# Patient Record
Sex: Male | Born: 1945 | Race: White | Hispanic: No | Smoking: Former smoker
Health system: Southern US, Community
[De-identification: ages and names within clinical notes are randomized; demographics above are authoritative.]

## PROBLEM LIST (undated history)

## (undated) DIAGNOSIS — I219 Acute myocardial infarction, unspecified: Secondary | ICD-10-CM

## (undated) DIAGNOSIS — I6529 Occlusion and stenosis of unspecified carotid artery: Secondary | ICD-10-CM

## (undated) DIAGNOSIS — R011 Cardiac murmur, unspecified: Secondary | ICD-10-CM

## (undated) DIAGNOSIS — I639 Cerebral infarction, unspecified: Secondary | ICD-10-CM

## (undated) DIAGNOSIS — K219 Gastro-esophageal reflux disease without esophagitis: Secondary | ICD-10-CM

## (undated) DIAGNOSIS — I2510 Atherosclerotic heart disease of native coronary artery without angina pectoris: Secondary | ICD-10-CM

## (undated) DIAGNOSIS — J449 Chronic obstructive pulmonary disease, unspecified: Secondary | ICD-10-CM

## (undated) DIAGNOSIS — I10 Essential (primary) hypertension: Secondary | ICD-10-CM

## (undated) HISTORY — DX: Cerebral infarction, unspecified: I63.9

## (undated) HISTORY — PX: EYE SURGERY: SHX253

## (undated) HISTORY — PX: VASECTOMY: SHX75

## (undated) HISTORY — DX: Atherosclerotic heart disease of native coronary artery without angina pectoris: I25.10

---

## 2005-02-19 ENCOUNTER — Ambulatory Visit: Payer: Self-pay | Admitting: Cardiology

## 2005-03-27 ENCOUNTER — Ambulatory Visit: Payer: Self-pay | Admitting: Cardiology

## 2011-08-23 DIAGNOSIS — I219 Acute myocardial infarction, unspecified: Secondary | ICD-10-CM

## 2011-08-23 HISTORY — DX: Acute myocardial infarction, unspecified: I21.9

## 2011-09-10 ENCOUNTER — Other Ambulatory Visit: Payer: Self-pay

## 2011-09-10 ENCOUNTER — Inpatient Hospital Stay (HOSPITAL_COMMUNITY): Payer: BC Managed Care – PPO

## 2011-09-10 ENCOUNTER — Emergency Department (HOSPITAL_COMMUNITY): Payer: BC Managed Care – PPO

## 2011-09-10 ENCOUNTER — Inpatient Hospital Stay (HOSPITAL_COMMUNITY)
Admission: EM | Admit: 2011-09-10 | Discharge: 2011-09-16 | DRG: 550 | Disposition: A | Discharge status: Home / Self Care | Payer: BC Managed Care – PPO | Attending: Neurology | Admitting: Neurology

## 2011-09-10 ENCOUNTER — Encounter: Payer: Self-pay | Admitting: Emergency Medicine

## 2011-09-10 DIAGNOSIS — I6529 Occlusion and stenosis of unspecified carotid artery: Secondary | ICD-10-CM | POA: Diagnosis present

## 2011-09-10 DIAGNOSIS — K219 Gastro-esophageal reflux disease without esophagitis: Secondary | ICD-10-CM | POA: Diagnosis present

## 2011-09-10 DIAGNOSIS — I10 Essential (primary) hypertension: Secondary | ICD-10-CM | POA: Diagnosis present

## 2011-09-10 DIAGNOSIS — R29810 Facial weakness: Secondary | ICD-10-CM | POA: Diagnosis present

## 2011-09-10 DIAGNOSIS — I658 Occlusion and stenosis of other precerebral arteries: Secondary | ICD-10-CM | POA: Diagnosis present

## 2011-09-10 DIAGNOSIS — I6340 Cerebral infarction due to embolism of unspecified cerebral artery: Secondary | ICD-10-CM | POA: Diagnosis present

## 2011-09-10 DIAGNOSIS — I213 ST elevation (STEMI) myocardial infarction of unspecified site: Secondary | ICD-10-CM

## 2011-09-10 DIAGNOSIS — F17200 Nicotine dependence, unspecified, uncomplicated: Secondary | ICD-10-CM | POA: Diagnosis present

## 2011-09-10 DIAGNOSIS — I639 Cerebral infarction, unspecified: Secondary | ICD-10-CM

## 2011-09-10 DIAGNOSIS — R4701 Aphasia: Secondary | ICD-10-CM | POA: Diagnosis present

## 2011-09-10 DIAGNOSIS — G8100 Flaccid hemiplegia affecting unspecified side: Secondary | ICD-10-CM | POA: Diagnosis present

## 2011-09-10 DIAGNOSIS — F1010 Alcohol abuse, uncomplicated: Secondary | ICD-10-CM | POA: Diagnosis present

## 2011-09-10 DIAGNOSIS — Z720 Tobacco use: Secondary | ICD-10-CM | POA: Diagnosis present

## 2011-09-10 DIAGNOSIS — I2119 ST elevation (STEMI) myocardial infarction involving other coronary artery of inferior wall: Principal | ICD-10-CM

## 2011-09-10 HISTORY — DX: Cerebral infarction, unspecified: I63.9

## 2011-09-10 HISTORY — DX: Essential (primary) hypertension: I10

## 2011-09-10 HISTORY — DX: Gastro-esophageal reflux disease without esophagitis: K21.9

## 2011-09-10 LAB — COMPREHENSIVE METABOLIC PANEL WITH GFR
ALT: 16 U/L (ref 0–53)
AST: 43 U/L — ABNORMAL HIGH (ref 0–37)
Albumin: 3.6 g/dL (ref 3.5–5.2)
Alkaline Phosphatase: 69 U/L (ref 39–117)
BUN: 9 mg/dL (ref 6–23)
CO2: 24 meq/L (ref 19–32)
Calcium: 9 mg/dL (ref 8.4–10.5)
Chloride: 93 meq/L — ABNORMAL LOW (ref 96–112)
Creatinine, Ser: 0.94 mg/dL (ref 0.50–1.35)
GFR calc Af Amer: >90 mL/min
GFR calc non Af Amer: 87 mL/min — ABNORMAL LOW
Glucose, Bld: 124 mg/dL — ABNORMAL HIGH (ref 70–99)
Potassium: 4.1 meq/L (ref 3.5–5.1)
Sodium: 129 meq/L — ABNORMAL LOW (ref 135–145)
Total Bilirubin: 1.1 mg/dL (ref 0.3–1.2)
Total Protein: 7.1 g/dL (ref 6.0–8.3)

## 2011-09-10 LAB — GLUCOSE, CAPILLARY: Glucose-Capillary: 119 mg/dL — ABNORMAL HIGH (ref 70–99)

## 2011-09-10 LAB — DIFFERENTIAL
Basophils Absolute: 0 10*3/uL (ref 0.0–0.1)
Basophils Relative: 0 % (ref 0–1)
Eosinophils Absolute: 0 10*3/uL (ref 0.0–0.7)
Eosinophils Relative: 0 % (ref 0–5)
Monocytes Absolute: 1.5 10*3/uL — ABNORMAL HIGH (ref 0.1–1.0)
Monocytes Relative: 13 % — ABNORMAL HIGH (ref 3–12)
Neutro Abs: 7.8 10*3/uL — ABNORMAL HIGH (ref 1.7–7.7)

## 2011-09-10 LAB — CK TOTAL AND CKMB (NOT AT ARMC)
CK, MB: 10.5 ng/mL (ref 0.3–4.0)
Relative Index: 6.6 — ABNORMAL HIGH (ref 0.0–2.5)
Total CK: 159 U/L (ref 7–232)

## 2011-09-10 LAB — CBC
HCT: 42.8 % (ref 39.0–52.0)
Hemoglobin: 15.7 g/dL (ref 13.0–17.0)
MCH: 35.4 pg — ABNORMAL HIGH (ref 26.0–34.0)
MCHC: 36.7 g/dL — ABNORMAL HIGH (ref 30.0–36.0)
MCV: 96.6 fL (ref 78.0–100.0)
RDW: 12.3 % (ref 11.5–15.5)

## 2011-09-10 LAB — POCT I-STAT, CHEM 8
Creatinine, Ser: 0.9 mg/dL (ref 0.50–1.35)
Glucose, Bld: 126 mg/dL — ABNORMAL HIGH (ref 70–99)
HCT: 49 % (ref 39.0–52.0)
Hemoglobin: 16.7 g/dL (ref 13.0–17.0)
Potassium: 4.2 mEq/L (ref 3.5–5.1)
Sodium: 131 mEq/L — ABNORMAL LOW (ref 135–145)
TCO2: 24 mmol/L (ref 0–100)

## 2011-09-10 LAB — APTT: aPTT: 26 seconds (ref 24–37)

## 2011-09-10 LAB — TROPONIN I: Troponin I: 1.38 ng/mL

## 2011-09-10 MED ORDER — SODIUM CHLORIDE 0.9 % IV SOLN
INTRAVENOUS | Status: DC
  Administered 2011-09-11: 08:00:00 via INTRAVENOUS

## 2011-09-10 MED ORDER — LABETALOL HCL 5 MG/ML IV SOLN
10.0000 mg | INTRAVENOUS | Status: DC | PRN
  Filled 2011-09-10 (×2): qty 16

## 2011-09-10 MED ORDER — PANTOPRAZOLE SODIUM 40 MG IV SOLR
40.0000 mg | Freq: Every day | INTRAVENOUS | Status: DC
  Administered 2011-09-10: 40 mg via INTRAVENOUS
  Filled 2011-09-10 (×2): qty 40

## 2011-09-10 MED ORDER — SODIUM CHLORIDE 0.9 % IV SOLN
250.0000 mL | Freq: Once | INTRAVENOUS | Status: AC
  Administered 2011-09-10: 250 mL via INTRAVENOUS

## 2011-09-10 MED ORDER — ACETAMINOPHEN 650 MG RE SUPP: 650.0000 mg | RECTAL | Status: DC | PRN

## 2011-09-10 MED ORDER — ONDANSETRON HCL 4 MG/2ML IJ SOLN: 4.0000 mg | Freq: Four times a day (QID) | INTRAMUSCULAR | Status: DC | PRN

## 2011-09-10 MED ORDER — ROSUVASTATIN CALCIUM 40 MG PO TABS
40.0000 mg | ORAL_TABLET | Freq: Every day | ORAL | Status: DC
  Administered 2011-09-10 – 2011-09-15 (×6): 40 mg via ORAL
  Filled 2011-09-10 (×7): qty 1

## 2011-09-10 MED ORDER — ACETAMINOPHEN 325 MG PO TABS
650.0000 mg | ORAL_TABLET | ORAL | Status: DC | PRN
  Administered 2011-09-11 – 2011-09-16 (×7): 650 mg via ORAL
  Filled 2011-09-10 (×7): qty 2

## 2011-09-10 MED ORDER — ALTEPLASE (STROKE) FULL DOSE INFUSION
0.9000 mg/kg | Freq: Once | INTRAVENOUS | Status: AC
  Administered 2011-09-10: 49 mg via INTRAVENOUS
  Filled 2011-09-10: qty 100

## 2011-09-10 MED ORDER — IOHEXOL 300 MG/ML  SOLN
80.0000 mL | Freq: Once | INTRAMUSCULAR | Status: AC | PRN
  Administered 2011-09-10: 80 mL via INTRAVENOUS

## 2011-09-10 MED ORDER — METOPROLOL TARTRATE 50 MG PO TABS
50.0000 mg | ORAL_TABLET | Freq: Two times a day (BID) | ORAL | Status: DC
  Administered 2011-09-10 – 2011-09-14 (×9): 50 mg via ORAL
  Filled 2011-09-10 (×11): qty 1

## 2011-09-10 MED ORDER — SENNOSIDES-DOCUSATE SODIUM 8.6-50 MG PO TABS
1.0000 | ORAL_TABLET | Freq: Every evening | ORAL | Status: DC | PRN
  Filled 2011-09-10: qty 1

## 2011-09-10 NOTE — ED Notes (Signed)
ckmb is 10.5 trop 1.38. Dr. Thad Ranger at bedside with pt and aware of lab values

## 2011-09-10 NOTE — ED Notes (Signed)
Pt returned from CT scan to RM 9 pod A and triage assessment performed

## 2011-09-10 NOTE — ED Notes (Signed)
3108-01 READY

## 2011-09-10 NOTE — ED Notes (Signed)
TPA medication / pump settings checked with Benson Norway, RN and Charlton Amor, RN prior to administration

## 2011-09-10 NOTE — Code Documentation (Addendum)
65yo male presented to the ED at 1600. He was at [facility] waiting on a prescription when he suddenly was unable to talk and got dizzy. Upon further questioning, he admits to some transient visual disturbance this AM which cleared, and some numbness of his L face at 1400. So LSN was established at 1400. EMS activated code stroke at 1541, and stroke team was in ED at 1548. Pt arrived at 1600 and was examined by EDP at that time. He arrived in CT at 1604. NIHSS was 7, with pt very dysarthric, ataxic all 4 extremities and unable to cross midline with R eye. Pt c/o double vision, but visual fields seem to be intact. Dr. Thad Ranger here.

## 2011-09-10 NOTE — ED Notes (Signed)
Pt placed on 2 L of oxygen, pt placed on monitor, placed in gown

## 2011-09-10 NOTE — ED Notes (Signed)
Received report on pt from EMS in CT scan. This is the first time I was able to assess pt

## 2011-09-10 NOTE — ED Notes (Signed)
Patient transported to CT 

## 2011-09-10 NOTE — ED Notes (Signed)
CBG 119 mg/dl by glucometer in ED

## 2011-09-10 NOTE — ED Provider Notes (Signed)
History     CSN: [id number] Arrival date & time: 09/10/2011  4:01 PM   First MD Initiated Contact with Patient 09/10/11 1602    Level V caveat due to urgent need for intervention of patient admission.  Chief Complaint  Patient presents with  . Code Stroke    (Consider location/radiation/quality/duration/timing/severity/associated sxs/prior treatment) The history is provided by the patient, the EMS personnel and a relative.   [facility], when he was counseled over the chair. Last normal 3:00. He a right-sided flaccid paresis at the time of facial droop. He also was unable to speak. He had some confusion 2. He was grabbing at his face. He came in as a code stroke. His initial sugar was okay. He was not hypoxic or severely hypertensive. His report he has a history of hypertension. Patient was taken emergently to CT. Upon arrival to the ED he had had an improvement in his paresis. Speech is improved a little bit. His facial droop is also improved  Past Medical History  Diagnosis Date  . Hypertension   . GERD (gastroesophageal reflux disease)     History reviewed. No pertinent past surgical history.  History reviewed. No pertinent family history.  History  Substance Use Topics  . Smoking status: Current Everyday Smoker -- 1.0 packs/day  . Smokeless tobacco: Not on file  . Alcohol Use: Yes     2-3x/ wk      Review of Systems  Unable to perform ROS: Mental status change    Allergies  Codeine  Home Medications  No current outpatient prescriptions on file.  BP 116/57  Pulse 65  Temp(Src) 97.9 F (36.6 C) (Oral)  Resp 17  Ht 5\' 9"  (1.753 m)  Wt 125 lb 7.1 oz (56.9 kg)  BMI 18.52 kg/m2  SpO2 96%  Physical Exam  Constitutional: He appears well-developed.  HENT:  Head: Normocephalic.  Eyes:       Patient's right eye will not cross the nasal midline. Will look laterally. The left eye appears to have full range of motion.  Neck: Normal range of  motion.  Cardiovascular: Normal rate.   Pulmonary/Chest: Effort normal.       Mildly harsh breath sounds  Abdominal: Soft. There is no tenderness.  Musculoskeletal:       Finger-nose is grossly off on the right side. Still able follow commands on that side. Although initially he would not follow commands.  Neurological: He is alert.       Patient is awake. Decreased verbal. It appears to wax and wane. He sometimes will speech in stuttering phrases. He'll follow some commands. Finger-nose is off on the right. Right side appears somewhat weak compared to left. On his extraocular exam. His right initially would not cross the midline medially at all. It is since improved but will not go away. Complete NIH score done by neurology.  Skin: Skin is warm.    ED Course  Procedures (including critical care time)  Labs Reviewed  CBC - Abnormal; Notable for the following:    WBC 11.4 (*)    MCH 35.4 (*)    MCHC 36.7 (*)    All other components within normal limits  DIFFERENTIAL - Abnormal; Notable for the following:    Neutro Abs 7.8 (*)    Monocytes Relative 13 (*)    Monocytes Absolute 1.5 (*)    All other components within normal limits  COMPREHENSIVE METABOLIC PANEL - Abnormal; Notable for the following:  Sodium 129 (*)    Chloride 93 (*)    Glucose, Bld 124 (*)    AST 43 (*)    GFR calc non Af Amer 87 (*)    All other components within normal limits  CK TOTAL AND CKMB - Abnormal; Notable for the following:    CK, MB 10.5 (*)    Relative Index 6.6 (*)    All other components within normal limits  TROPONIN I - Abnormal; Notable for the following:    Troponin I 1.38 (*)    All other components within normal limits  POCT I-STAT, CHEM 8 - Abnormal; Notable for the following:    Sodium 131 (*)    Glucose, Bld 126 (*)    Calcium, Ion 1.06 (*)    All other components within normal limits  GLUCOSE, CAPILLARY - Abnormal; Notable for the following:    Glucose-Capillary 119 (*)    All  other components within normal limits  GLUCOSE, CAPILLARY - Abnormal; Notable for the following:    Glucose-Capillary 106 (*)    All other components within normal limits  PROTIME-INR  APTT  MRSA PCR SCREENING  I-STAT, CHEM 8  POCT CBG MONITORING  MRSA PCR SCREENING  HEMOGLOBIN A1C  LIPID PANEL   Ct Head Wo Contrast  09/10/2011  *RADIOLOGY REPORT*  Clinical Data: Code stroke.  No acute onset right side weakness.  CT HEAD WITHOUT CONTRAST  Technique:  Contiguous axial images were obtained from the base of the skull through the vertex without contrast.  Comparison: None.  Findings: There is no evidence of acute intracranial abnormality including acute infarction, hemorrhage, mass lesion, mass effect, midline shift or abnormal extra-axial fluid collection.  There is some cortical atrophy and chronic microvascular ischemic change. Mucosal thickening both maxillary sinuses is noted.  IMPRESSION:  1.  No acute abnormality. 2.  Atrophy and chronic microvascular ischemic change. 3.  Bilateral maxillary sinus disease.  Original Report Authenticated By: Bernadene Bell. Maricela Curet, M.D.   Ct Angio Chest W/cm &/or Wo Cm  09/10/2011  *RADIOLOGY REPORT*  Clinical Data:  CVA, acute MI, evaluate for aortic dissection  CT ANGIOGRAPHY CHEST, ABDOMEN AND PELVIS  Technique:  Multidetector CT imaging through the chest, abdomen and pelvis was performed using the standard protocol during bolus administration of intravenous contrast.  Multiplanar reconstructed images including MIPs were obtained and reviewed to evaluate the vascular anatomy.  Contrast: 80mL OMNIPAQUE IOHEXOL 300 MG/ML IV SOLN  Comparison:  None  CTA CHEST  Findings:  No evidence of intramural hematoma on unenhanced imaging.  No evidence of aortic dissection.  No evidence of pulmonary embolism.  Moderate centrilobular emphysematous changes.  No suspicious pulmonary nodules. No pleural effusion or pneumothorax.  Visualized thyroid is unremarkable.  The heart is  normal in size.  No pericardial effusion. Subendocardial hypoperfusion along the inferior wall of the left ventricle (series 6/image 130), compatible with reported history of acute myocardial infarction.  Coronary atherosclerosis.  Atherosclerotic calcifications of the aortic arch.  No suspicious mediastinal, hilar, or axillary lymphadenopathy.  Degenerative changes of the thoracic spine.   Review of the MIP images confirms the above findings.  IMPRESSION: No evidence of aortic dissection or pulmonary embolism.  Subendocardial hypoperfusion along the inferior wall of the left ventricle, compatible with reported history of acute myocardial infarction.  Moderate centrilobular emphysematous changes.  Critical Value/emergent results were called by telephone at the time of interpretation on 09/10/2011  at 1750 hours  to  Dr. Tonny Bollman, who verbally acknowledged  these results.  CTA ABDOMEN AND PELVIS  Findings:  No evidence of aortic dissection.  Liver, spleen, pancreas, and adrenal glands are within normal limits.  Gallbladder is unremarkable.  No intrahepatic or extrahepatic ductal dilatation.  Left kidney is within normal limits.  Small right kidney with marked cortical atrophy.  No hydronephrosis.  No evidence of bowel obstruction.  Atherosclerotic calcifications of the abdominal aorta and branch vessels.  No abdominopelvic ascites.  No suspicious abdominopelvic lymphadenopathy.  Prostate is unremarkable.  Bladder is decompressed by indwelling Foley catheter.  Degenerative changes of the lumbar spine.   Review of the MIP images confirms the above findings.  IMPRESSION: No evidence of aortic dissection.  Small right kidney with marked cortical atrophy.  Original Report Authenticated By: Charline Bills, M.D.   Dg Chest Port 1 View  09/10/2011  *RADIOLOGY REPORT*  Clinical Data: Stroke  PORTABLE CHEST - 1 VIEW  Comparison: CT chest 09/10/2011  Findings: COPD with hyperinflation of the lungs.  Negative for  pneumonia or heart failure.  Negative for mass lesion.  Apical scarring bilaterally.  IMPRESSION: COPD.  No active cardiopulmonary disease.  Original Report Authenticated By: Camelia Phenes, M.D.   Ct Angio Abd/pel W/ And/or W/o  09/10/2011  *RADIOLOGY REPORT*  Clinical Data:  CVA, acute MI, evaluate for aortic dissection  CT ANGIOGRAPHY CHEST, ABDOMEN AND PELVIS  Technique:  Multidetector CT imaging through the chest, abdomen and pelvis was performed using the standard protocol during bolus administration of intravenous contrast.  Multiplanar reconstructed images including MIPs were obtained and reviewed to evaluate the vascular anatomy.  Contrast: 80mL OMNIPAQUE IOHEXOL 300 MG/ML IV SOLN  Comparison:  None  CTA CHEST  Findings:  No evidence of intramural hematoma on unenhanced imaging.  No evidence of aortic dissection.  No evidence of pulmonary embolism.  Moderate centrilobular emphysematous changes.  No suspicious pulmonary nodules. No pleural effusion or pneumothorax.  Visualized thyroid is unremarkable.  The heart is normal in size.  No pericardial effusion. Subendocardial hypoperfusion along the inferior wall of the left ventricle (series 6/image 130), compatible with reported history of acute myocardial infarction.  Coronary atherosclerosis.  Atherosclerotic calcifications of the aortic arch.  No suspicious mediastinal, hilar, or axillary lymphadenopathy.  Degenerative changes of the thoracic spine.   Review of the MIP images confirms the above findings.  IMPRESSION: No evidence of aortic dissection or pulmonary embolism.  Subendocardial hypoperfusion along the inferior wall of the left ventricle, compatible with reported history of acute myocardial infarction.  Moderate centrilobular emphysematous changes.  Critical Value/emergent results were called by telephone at the time of interpretation on 09/10/2011  at 1750 hours  to  Dr. Tonny Bollman, who verbally acknowledged these results.  CTA ABDOMEN AND  PELVIS  Findings:  No evidence of aortic dissection.  Liver, spleen, pancreas, and adrenal glands are within normal limits.  Gallbladder is unremarkable.  No intrahepatic or extrahepatic ductal dilatation.  Left kidney is within normal limits.  Small right kidney with marked cortical atrophy.  No hydronephrosis.  No evidence of bowel obstruction.  Atherosclerotic calcifications of the abdominal aorta and branch vessels.  No abdominopelvic ascites.  No suspicious abdominopelvic lymphadenopathy.  Prostate is unremarkable.  Bladder is decompressed by indwelling Foley catheter.  Degenerative changes of the lumbar spine.   Review of the MIP images confirms the above findings.  IMPRESSION: No evidence of aortic dissection.  Small right kidney with marked cortical atrophy.  Original Report Authenticated By: Charline Bills, M.D.     1.  Stroke   2. STEMI (ST elevation myocardial infarction)   3. Acute myocardial infarction of inferoposterior wall, initial episode of care     Date: 09/11/2011  Rate: 77  Rhythm: normal sinus rhythm and premature ventricular contractions (PVC)  QRS Axis: normal  Intervals: normal  ST/T Wave abnormalities: ST elevations inferiorly, ST depressions anteriorly and ST depressions laterally  Conduction Disutrbances:none  Narrative Interpretation:   Old EKG Reviewed: none available  CRITICAL CARE Performed by: Billee Cashing   Total critical care time: 30  Critical care time was exclusive of separately billable procedures and treating other patients.  Critical care was necessary to treat or prevent imminent or life-threatening deterioration.  Critical care was time spent personally by me on the following activities: development of treatment plan with patient and/or surrogate as well as nursing, discussions with consultants, evaluation of patient's response to treatment, examination of patient, obtaining history from patient or surrogate, ordering and performing  treatments and interventions, ordering and review of laboratory studies, ordering and review of radiographic studies, pulse oximetry and re-evaluation of patient's condition.   MDM  Patient came in as a code stroke. He was at a drugstore reportedly was slumped over and had right-sided flaccid paresis some altered mental status and difficulty speaking. On my exams gums were improving somewhat still present. Initial head CT was negative. Patient was seen quickly by neurology. TPA was given under their orders. EKG was done to look for arrhythmia, and it showed ST elevations inferiorly with depression laterally and anteriorly. Cardiology was consulted. Chest CT was done emergently to look for aortic dissection. There was no aortic dissection present. Patient was admitted.        Juliet Rude. Rubin Payor, MD 09/11/11 [phone]

## 2011-09-10 NOTE — Plan of Care (Signed)
Problem: Consults Goal: Stroke - Ischemic/TIA Patient Education See Patient Education Module for education specifics. Outcome: Completed/Met Date Met:  09/10/11 Education provided to patient and family regarding plan of care, smoking cessation, and signs/symptoms of stroke,

## 2011-09-10 NOTE — Consult Note (Signed)
CARDIOLOGY CONSULT NOTE  Patient ID: Vincent Gilbert, MRN: [id number], DOB/AGE: May 27, 1946 65 y.o. Admit date: 09/10/2011 Date of Consult: 09/10/2011  Primary Physician: No primary provider on file. Primary Cardiologist: New to [place], Consulted by Dr. Gala Romney  Chief Complaint: acute CVA Reason for Consultation: STEMI  HPI: 65yom w/ PMHx significant for HTN, tobacco and alcohol abuse and no prior cardiac history who presented to [facility] as a code stroke and was found to also have an acute inferoposterior STEMI.  He awakened normal this morning and took his sister to an appointment when upon leaving noted numbness around his mouth. He went to the pharmacy and while waiting was noted by pharmacy staff to be slumped over and not responding appropriately. He had left-sided flaccid paralysis with facial droop and aphasia. He presented to the ED as a code stroke with some improvement in his paresis, speech and facial droop. His initial blood sugar was 124, he was not hypoxic or severely hypertensive. He was taken emergently for a head CT which revealed no evidence of acute intracranial abnormality, however, with his presentation neurology suspected a posterior circulation event. He was given tPA and closely monitored.  His initial EKG was without acute changes, but his troponin came back elevated and his repeat EKG revealed ST elevation in the inferiorly. He was chest pain free, but there was concern for aortic dissection and he was taken for a chest CT which was negative. After the tPA infusion the patient was able to communicate and reported he has had worsening of his heart burn over the last two days, but no other complaints. He reports smoking 1ppd for over 28yrs and drinking 6-12 beers/day.   Diagnosis   Tobacco Abuse   Alcohol Abuse  . Hypertension  . GERD (gastroesophageal reflux disease)      Surgical History:    Home Meds: Medication Sig  levocetirizine (XYZAL)  5 MG tablet Take 5 mg by mouth every evening.    metoprolol (TOPROL-XL) 100 MG 24 hr tablet Take 100 mg by mouth daily.    pantoprazole (PROTONIX) 40 MG tablet Take 40 mg by mouth daily.    valsartan-hydrochlorothiazide (DIOVAN-HCT) 320-12.5 MG per tablet Take 1 tablet by mouth daily.      Inpatient Medications:  . sodium chloride  250 mL Intravenous Once  . alteplase  0.9 mg/kg Intravenous Once    Allergies: No Known Allergies  History   Social History  . Marital Status: Married   Occupational History  .    Social History Main Topics  . Smoking status: Current Everyday Smoker -- 1.0 packs/day  . Alcohol Use: Yes     6-12 beers/day  . Drug Use:    Family History: Father died of MI at 22yo   Review of Systems:  General: negative for chills, fever, night sweats or weight changes.  Cardiovascular: negative for chest pain, shortness of breath, dyspnea on exertion, edema, orthopnea, palpitations, or paroxysmal nocturnal dyspnea Dermatological: negative for rash Respiratory: negative for cough or wheezing Urologic: negative for hematuria Abdominal: Worsening of his heart burn; negative for nausea, vomiting, diarrhea, bright red blood per rectum, melena, or hematemesis Neurologic: As per HPI All other systems reviewed and are otherwise negative except as noted above.  Labs:  [facility] 09/10/11 1605  CKTOTAL 159  CKMB 10.5*  TROPONINI 1.38*   Lab Results  Component Value Date   WBC 11.4* 09/10/2011   HGB 16.7 09/10/2011   HCT 49.0 09/10/2011   MCV 96.6  09/10/2011   PLT 231 09/10/2011     Lab 09/10/11 1611 09/10/11 1601  NA 131* --  K 4.2 --  CL 97 --  CO2 -- 24  BUN 9 --  CREATININE 0.90 --  CALCIUM -- 9.0  PROT -- 7.1  BILITOT -- 1.1  ALKPHOS -- 69  ALT -- 16  AST -- 43*  GLUCOSE 126* --   Radiology/Studies:  Ct Head Wo Contrast 09/10/2011 Findings: There is no evidence of acute intracranial abnormality including acute infarction, hemorrhage, mass  lesion, mass effect, midline shift or abnormal extra-axial fluid collection.  There is some cortical atrophy and chronic microvascular ischemic change. Mucosal thickening both maxillary sinuses is noted.  IMPRESSION:  1.  No acute abnormality. 2.  Atrophy and chronic microvascular ischemic change. 3.  Bilateral maxillary sinus disease.    EKG: NSR with acute inferoposterior injury - ACUTE MI  Physical Exam: Blood pressure 129/62, pulse 82, temperature 98 F (36.7 C), temperature source Oral, resp. rate 15, height 5\' 9"  (1.753 m), weight 54.432 kg (120 lb), SpO2 100.00%. General: Well developed, well nourished, white male in no acute distress. Head: Normocephalic, atraumatic, sclera non-icteric, no xanthomas, nares are without discharge.  Neck: Supple. Negative for carotid bruits. JVD not elevated. Lungs: Clear bilaterally to auscultation without wheezes, rales, or rhonchi. Breathing is unlabored. Heart: RRR with S1 S2. No murmurs, rubs, or gallops appreciated. Abdomen: Soft, non-tender, non-distended with normoactive bowel sounds. No rebound/guarding. No obvious abdominal masses. Msk:  Strength and tone appear normal for age. Extremities: No clubbing or cyanosis. No edema.  Distal pedal pulses are 2+ and equal bilaterally. Neuro: Mild dysarthria and right sided facial droop. Alert and oriented X 3. Moves all extremities spontaneously and on command. Strength 5/5 in arms and legs Psych:  Responds to questions appropriately with a normal affect.   Assessment and Plan:  65yom w/ PMHx significant for HTN, tobacco and alcohol abuse and no prior cardiac history who presented to [facility] as a code stroke and was found to also have an acute inferoposterior STEMI.  1. STEMI:Difficult situation in patient with acute stroke. Pt is being treated with thrombolysis which will also treat his MI. Recommend initiation of ASA and/or plavix when window of concern for acute cerebral bleed is over. If one  agent has to be chosen, would favor plavix. Start statin, will check echo to eval LV function. Probably should have cardiac cath this admission if OK with neuro will plan to do in approximately 48 hours.  2. CVA: Received tPA with almost complete resolution of symptoms. Management per primary team  3. HTN: stable. Management per primary team  4. Tobacco abuse: Smoking cessation counseling  5. ETOH: heavy ETOH may need CIWA protocol.   Signed, HOPE, JESSICA PA-C 09/10/2011, 5:47 PM  Patient seen, examined. Available data reviewed. Agree with findings, assessment, and plan as outlined by [facility].  The patient presents with acute stroke and inferior STEMI. I independently examined the patient and discussed his case with Dr Thad Ranger of Neurology. Situation discussed at length with the patient's family.  Tonny Bollman, M.D.

## 2011-09-10 NOTE — H&P (Addendum)
Admission H&P    Chief Complaint: Right sided weakness and difficulty with speech HPI: Vincent Gilbert is an 65 y.o. male who awakened normal today.  Took his sister to an appointment and upon leaving noted numbness around his mouth.  He dropped his sister at home and went to the pharmacy.  While awaiting her meds was noted by pharmacy staff to be slumped over and not responding appropriately.  EMS was called and patient was brought in as a code stroke.  Has complaints of numbness, weakness and  Initial NIHSS of 7.   mRankin: 0 LSN: 1400 tPA Given: Yes  Past Medical History  Diagnosis Date  . Hypertension   . GERD (gastroesophageal reflux disease)     No past surgical history on file.  No family history on file. Social History:  reports that he has been smoking.  He does not have any smokeless tobacco history on file. He reports that he drinks alcohol. His drug history not on file.  Allergies: No Known Allergies  Medications Prior to Admission  Medication Dose Route Frequency Provider Last Rate Last Dose   Xyzal           Toprol               Protonix       Diovan    ROS: History obtained from the patient  General ROS: negative for - chills, fatigue, fever, night sweats, weight gain or weight loss Psychological ROS: negative for - behavioral disorder, hallucinations, memory difficulties, mood swings or suicidal ideation Ophthalmic ROS: negative for - blurry vision, double vision, eye pain or loss of vision ENT ROS: negative for - epistaxis, nasal discharge, oral lesions, sore throat, tinnitus or vertigo Allergy and Immunology ROS: negative for - hives or itchy/watery eyes Hematological and Lymphatic ROS: negative for - bleeding problems, bruising or swollen lymph nodes Endocrine ROS: negative for - galactorrhea, hair pattern changes, polydipsia/polyuria or temperature intolerance Respiratory ROS: negative for - cough, hemoptysis, shortness of breath or  wheezing Cardiovascular ROS: negative for - chest pain, dyspnea on exertion, edema or irregular heartbeat Gastrointestinal ROS: negative for - abdominal pain, diarrhea, hematemesis, nausea/vomiting or stool incontinence Genito-Urinary ROS: negative for - dysuria, hematuria, incontinence or urinary frequency/urgency Musculoskeletal ROS: negative for - joint swelling or muscular weakness Neurological ROS: as noted in HPI Dermatological ROS: negative for rash and skin lesion changes  Physical Examination: Blood pressure 147/75, pulse 84, temperature 98 F (36.7 C), temperature source Oral, resp. rate 24, height 5\' 9"  (1.753 m), weight 54.432 kg (120 lb), SpO2 100.00%.  HEENT-  Normocephalic, no lesions, without obvious abnormality.  Normal external eye and conjunctiva.  Normal TM's bilaterally.  Normal auditory canals and external ears. Normal external nose, mucus membranes and septum.  Normal pharynx. Neck supple with no masses, nodes, nodules or enlargement. Cardiovascular - S1, S2 normal Lungs - chest clear, no wheezing, rales, normal symmetric air entry Abdomen - soft, non-tender; bowel sounds normal; no masses,  no organomegaly Extremities - no edema  Neurologic Examination: Mental Status: Alert, oriented, thought content appropriate.  Speech dysarthric without evidence of aphasia.  Able to follow 3 step commands without difficulty. Cranial Nerves: II: visual fields grossly normal, pupils unequal, round, reactive to light and accommodation. Left pupil 3mm and right pupil 2mm. III,IV, VI: ptosis not present. Unable to move right eye medially. V,VII: left facial droop, decreased sensation around the mouth on the left VIII: hearing normal bilaterally IX,X: gag reflex decreased XI: trapezius  strength/neck flexion strength normal bilaterally XII: tongue deviation to the left Motor: Right : Upper extremity   5-/5    Left:     Upper extremity   5/5  Lower extremity   5-/5     Lower  extremity   5-/5 Tone and bulk:normal tone throughout; no atrophy noted Sensory:  Decreased pinprick and light touch in the right arm.  Otherwise intact throughout, bilaterally Deep Tendon Reflexes: 2+ and symmetric in the upper extremities.  Absent in the lower extremities Plantars: Right: upgoing   Left: upgoing Cerebellar: Finger-to-nose and heel-to-shin test with dysmetria bilaterally   Results for orders placed during the hospital encounter of 09/10/11 (from the past 48 hour(s))  PROTIME-INR     Status: Normal   Collection Time   09/10/11  4:01 PM      Component Value Range Comment   Prothrombin Time 12.5  11.6 - 15.2 (seconds)    INR 0.91  0.00 - 1.49    APTT     Status: Normal   Collection Time   09/10/11  4:01 PM      Component Value Range Comment   aPTT 26  24 - 37 (seconds)   CBC     Status: Abnormal   Collection Time   09/10/11  4:01 PM      Component Value Range Comment   WBC 11.4 (*) 4.0 - 10.5 (K/uL)    RBC 4.43  4.22 - 5.81 (MIL/uL)    Hemoglobin 15.7  13.0 - 17.0 (g/dL)    HCT 40.9  81.1 - 91.4 (%)    MCV 96.6  78.0 - 100.0 (fL)    MCH 35.4 (*) 26.0 - 34.0 (pg)    MCHC 36.7 (*) 30.0 - 36.0 (g/dL)    RDW 78.2  95.6 - 21.3 (%)    Platelets 231  150 - 400 (K/uL)   DIFFERENTIAL     Status: Abnormal   Collection Time   09/10/11  4:01 PM      Component Value Range Comment   Neutrophils Relative 69  43 - 77 (%)    Neutro Abs 7.8 (*) 1.7 - 7.7 (K/uL)    Lymphocytes Relative 18  12 - 46 (%)    Lymphs Abs 2.1  0.7 - 4.0 (K/uL)    Monocytes Relative 13 (*) 3 - 12 (%)    Monocytes Absolute 1.5 (*) 0.1 - 1.0 (K/uL)    Eosinophils Relative 0  0 - 5 (%)    Eosinophils Absolute 0.0  0.0 - 0.7 (K/uL)    Basophils Relative 0  0 - 1 (%)    Basophils Absolute 0.0  0.0 - 0.1 (K/uL)   COMPREHENSIVE METABOLIC PANEL     Status: Abnormal   Collection Time   09/10/11  4:01 PM      Component Value Range Comment   Sodium 129 (*) 135 - 145 (mEq/L)    Potassium 4.1  3.5 - 5.1  (mEq/L)    Chloride 93 (*) 96 - 112 (mEq/L)    CO2 24  19 - 32 (mEq/L)    Glucose, Bld 124 (*) 70 - 99 (mg/dL)    BUN 9  6 - 23 (mg/dL)    Creatinine, Ser 0.86  0.50 - 1.35 (mg/dL)    Calcium 9.0  8.4 - 10.5 (mg/dL)    Total Protein 7.1  6.0 - 8.3 (g/dL)    Albumin 3.6  3.5 - 5.2 (g/dL)    AST 43 (*) 0 - 37 (  U/L)    ALT 16  0 - 53 (U/L)    Alkaline Phosphatase 69  39 - 117 (U/L)    Total Bilirubin 1.1  0.3 - 1.2 (mg/dL)    GFR calc non Af Amer 87 (*) >90 (mL/min)    GFR calc Af Amer >90  >90 (mL/min)   CK TOTAL AND CKMB     Status: Abnormal (Preliminary result)   Collection Time   09/10/11  4:05 PM      Component Value Range Comment   Total CK PENDING  7 - 232 (U/L)    CK, MB 10.5 (*) 0.3 - 4.0 (ng/mL)    Relative Index PENDING  0.0 - 2.5    TROPONIN I     Status: Abnormal   Collection Time   09/10/11  4:05 PM      Component Value Range Comment   Troponin I 1.38 (*) <0.30 (ng/mL)   POCT I-STAT, CHEM 8     Status: Abnormal   Collection Time   09/10/11  4:11 PM      Component Value Range Comment   Sodium 131 (*) 135 - 145 (mEq/L)    Potassium 4.2  3.5 - 5.1 (mEq/L)    Chloride 97  96 - 112 (mEq/L)    BUN 9  6 - 23 (mg/dL)    Creatinine, Ser 5.40  0.50 - 1.35 (mg/dL)    Glucose, Bld 981 (*) 70 - 99 (mg/dL)    Calcium, Ion 1.91 (*) 1.12 - 1.32 (mmol/L)    TCO2 24  0 - 100 (mmol/L)    Hemoglobin 16.7  13.0 - 17.0 (g/dL)    HCT 47.8  29.5 - 62.1 (%)   GLUCOSE, CAPILLARY     Status: Abnormal   Collection Time   09/10/11  4:18 PM      Component Value Range Comment   Glucose-Capillary 119 (*) 70 - 99 (mg/dL)    Comment 1 Notify RN      Comment 2 Documented in Chart      Ct Head Wo Contrast  09/10/2011  *RADIOLOGY REPORT*  Clinical Data: Code stroke.  No acute onset right side weakness.  CT HEAD WITHOUT CONTRAST  Technique:  Contiguous axial images were obtained from the base of the skull through the vertex without contrast.  Comparison: None.  Findings: There is no evidence  of acute intracranial abnormality including acute infarction, hemorrhage, mass lesion, mass effect, midline shift or abnormal extra-axial fluid collection.  There is some cortical atrophy and chronic microvascular ischemic change. Mucosal thickening both maxillary sinuses is noted.  IMPRESSION:  1.  No acute abnormality. 2.  Atrophy and chronic microvascular ischemic change. 3.  Bilateral maxillary sinus disease.  Original Report Authenticated By: Bernadene Bell. Maricela Curet, M.D.    Assessment: 65 y.o. male who presents with right sided weakness, numbness, diplopia, tongue deviation and dysmetria.  Suspect a posterior circulation event.  Patient given tPA after verbal consent obtained from patient and daughter.  Risks and benefits discussed.    Stroke Risk Factors - hypertension and smoking  Plan: 1. HgbA1c, fasting lipid panel 2. MRI, MRA  of the brain without contrast 3. PT consult, OT consult, Speech consult 4. Echocardiogram 5. Carotid dopplers 6. Prophylactic therapy-Antiplatelet med: Plavix - dose 75 mg daily to start 24 hours post tPA 7. Risk factor modification 8. Repeat head CT in 24 hours  Addendum: Troponins returned elevated.  EKG repeated and shows S-T elevations.  Patient felt to be a STEMI.  Case discussed with cardiology.  They will follow the patient in consult.  Stat CT of the chest ordered to rule out dissection.  Addendum: CT shows no evidence of dissection   Thana Farr, MD [facility] [phone] 09/10/2011, 4:57 PM   This patient is critically ill and at significant risk of neurological worsening, death and care requires constant monitoring of vital signs, hemodynamics,respiratory and cardiac monitoring, neurological assessment, discussion with family, other specialists and medical decision making of high complexity. I spent 120 minutes of neurocritical care time in the care of  this patient.  Thana Farr, MD [facility] [phone] 09/10/2011  6:50 PM

## 2011-09-11 ENCOUNTER — Inpatient Hospital Stay (HOSPITAL_COMMUNITY): Payer: BC Managed Care – PPO

## 2011-09-11 DIAGNOSIS — I369 Nonrheumatic tricuspid valve disorder, unspecified: Secondary | ICD-10-CM

## 2011-09-11 DIAGNOSIS — I2119 ST elevation (STEMI) myocardial infarction involving other coronary artery of inferior wall: Secondary | ICD-10-CM

## 2011-09-11 LAB — CARDIAC PANEL(CRET KIN+CKTOT+MB+TROPI)
CK, MB: 62.7 ng/mL (ref 0.3–4.0)
Relative Index: 5.5 — ABNORMAL HIGH (ref 0.0–2.5)
Total CK: 1130 U/L — ABNORMAL HIGH (ref 7–232)
Troponin I: >25 ng/mL

## 2011-09-11 LAB — LIPID PANEL
Cholesterol: 183 mg/dL (ref 0–200)
LDL Cholesterol: 78 mg/dL (ref 0–99)
Total CHOL/HDL Ratio: 2.1 RATIO
VLDL: 18 mg/dL (ref 0–40)

## 2011-09-11 LAB — GLUCOSE, CAPILLARY: Glucose-Capillary: 179 mg/dL — ABNORMAL HIGH (ref 70–99)

## 2011-09-11 MED ORDER — ASPIRIN 325 MG PO TABS
325.0000 mg | ORAL_TABLET | Freq: Every day | ORAL | Status: DC
  Administered 2011-09-11: 325 mg via ORAL
  Filled 2011-09-11 (×2): qty 1

## 2011-09-11 MED ORDER — PANTOPRAZOLE SODIUM 40 MG PO TBEC
40.0000 mg | DELAYED_RELEASE_TABLET | Freq: Every day | ORAL | Status: DC
  Administered 2011-09-11 – 2011-09-16 (×5): 40 mg via ORAL
  Filled 2011-09-11 (×4): qty 1

## 2011-09-11 MED ORDER — VITAMIN B-1 100 MG PO TABS
100.0000 mg | ORAL_TABLET | Freq: Every day | ORAL | Status: DC
  Administered 2011-09-11 – 2011-09-16 (×5): 100 mg via ORAL
  Filled 2011-09-11 (×6): qty 1

## 2011-09-11 MED ORDER — LORAZEPAM 2 MG/ML IJ SOLN: 1.0000 mg | Freq: Four times a day (QID) | INTRAMUSCULAR | Status: AC | PRN

## 2011-09-11 MED ORDER — GUAIFENESIN ER 600 MG PO TB12
600.0000 mg | ORAL_TABLET | Freq: Two times a day (BID) | ORAL | Status: DC | PRN
  Administered 2011-09-11 – 2011-09-14 (×5): 600 mg via ORAL
  Filled 2011-09-11 (×6): qty 1

## 2011-09-11 MED ORDER — LORAZEPAM 1 MG PO TABS
1.0000 mg | ORAL_TABLET | Freq: Four times a day (QID) | ORAL | Status: AC | PRN
  Administered 2011-09-12: 1 mg via ORAL
  Filled 2011-09-11: qty 1

## 2011-09-11 MED ORDER — LORAZEPAM 2 MG/ML IJ SOLN
INTRAMUSCULAR | Status: AC
  Administered 2011-09-11: 0.5 mg via INTRAVENOUS
  Filled 2011-09-11: qty 1

## 2011-09-11 MED ORDER — FOLIC ACID 1 MG PO TABS
1.0000 mg | ORAL_TABLET | Freq: Every day | ORAL | Status: DC
  Administered 2011-09-11 – 2011-09-14 (×4): 1 mg via ORAL
  Filled 2011-09-11 (×5): qty 1

## 2011-09-11 MED ORDER — LORAZEPAM 1 MG PO TABS: 0.0000 mg | ORAL_TABLET | Freq: Two times a day (BID) | ORAL | Status: DC

## 2011-09-11 MED ORDER — THIAMINE HCL 100 MG/ML IJ SOLN
100.0000 mg | Freq: Every day | INTRAMUSCULAR | Status: DC
  Filled 2011-09-11 (×5): qty 1

## 2011-09-11 MED ORDER — LORAZEPAM 2 MG/ML IJ SOLN
0.5000 mg | Freq: Once | INTRAMUSCULAR | Status: AC
  Administered 2011-09-11: 0.5 mg via INTRAVENOUS

## 2011-09-11 MED ORDER — ADULT MULTIVITAMIN W/MINERALS CH
1.0000 | ORAL_TABLET | Freq: Every day | ORAL | Status: DC
  Administered 2011-09-11 – 2011-09-14 (×4): 1 via ORAL
  Filled 2011-09-11 (×5): qty 1

## 2011-09-11 MED ORDER — LORAZEPAM 1 MG PO TABS: 0.0000 mg | ORAL_TABLET | Freq: Four times a day (QID) | ORAL | Status: AC

## 2011-09-11 NOTE — Progress Notes (Signed)
The patient is receiving Protonix by the intravenous route.  Based on criteria approved by the Pharmacy and Therapeutics Committee and the Medical Executive Committee, the medication is being converted to the equivalent oral dose form.  These criteria include: -No Active GI bleeding -Able to tolerate diet of full liquids (or better) or tube feeding -Able to tolerate other medications by the oral or enteral route  If you have any questions about this conversion, please contact the Pharmacy Department (ext [phone]).  Thank you.   Christoper Fabian, PharmD, BCPS 09/11/11 1340

## 2011-09-11 NOTE — Progress Notes (Signed)
PT Cancellation Note  Treatment cancelled today due to medical issues with patient which prohibited therapy: Patient to remain on bed rest secondary to CVA and MI. Will check 09/12/11  09/11/2011 Edwyna Perfect, PT  Pager [phone]

## 2011-09-11 NOTE — Progress Notes (Signed)
Speech Language/Pathology  Order received. Speech-language-cognitive assessment to be completed tomorrow.  Breck Coons Mascot.Ed [employer] [phone]  09/11/2011

## 2011-09-11 NOTE — Progress Notes (Signed)
*  PRELIMINARY RESULTS*   Carotid Dopplers completed.  Right: 60-79% ICA stenosis, highest end of range.  Left: 60-79% ICA stenosis, mid range of scale.  Bilateral vertebral flow is antegrade.    Sherren Kerns Renee 09/11/2011, 11:00 AM

## 2011-09-11 NOTE — Progress Notes (Signed)
Stroke Team Progress Note  SUBJECTIVE Vincent Gilbert is an 65 y.o. male who awakened normal today. Took his sister to an appointment and upon leaving noted numbness around his mouth. He dropped his sister at home and went to the pharmacy. While awaiting her meds was noted by pharmacy staff to be slumped over and not responding appropriately. EMS was called and patient was brought in as a code stroke. Has complaints of numbness, weakness and Initial NIHSS of 7.  No family/friends at the bedside. He complains of congestion/cough. He is concerned about the MRI and anxiety.  OBJECTIVE Most recent Vital Signs: Temp: 97.8 F (36.6 C) (12/20 0400) Temp src: Oral (12/20 0400) BP: 126/66 mmHg (12/20 0800) Pulse Rate: 66  (12/20 0800) Respiratory Rate: 19 O2 Saturdation: 100%  CBG (last 3)   Basename 09/10/11 1929 09/10/11 1618  GLUCAP 106* 119*   Intake/Output from previous day: 12/19 0701 - 12/20 0700 In: 944 [I.V.:944] Out: 1025 [Urine:1025]  IV Fluid Intake:      . sodium chloride 75 mL/hr at 09/11/11 0818   Medications    . sodium chloride  250 mL Intravenous Once  . alteplase  0.9 mg/kg Intravenous Once  . LORazepam  0.5 mg Intravenous Once  . metoprolol tartrate  50 mg Oral BID  . pantoprazole (PROTONIX) IV  40 mg Intravenous QHS  . rosuvastatin  40 mg Oral QPC supper   Diet:  Cardiac thin liquids Activity:  Bedrest DVT Prophylaxis:  SCDs   Studies: CBC    Component Value Date/Time   WBC 11.4* 09/10/2011 1601   RBC 4.43 09/10/2011 1601   HGB 16.7 09/10/2011 1611   HCT 49.0 09/10/2011 1611   PLT 231 09/10/2011 1601   MCV 96.6 09/10/2011 1601   MCH 35.4* 09/10/2011 1601   MCHC 36.7* 09/10/2011 1601   RDW 12.3 09/10/2011 1601   LYMPHSABS 2.1 09/10/2011 1601   MONOABS 1.5* 09/10/2011 1601   EOSABS 0.0 09/10/2011 1601   BASOSABS 0.0 09/10/2011 1601   CMP    Component Value Date/Time   NA 131* 09/10/2011 1611   K 4.2 09/10/2011 1611   CL 97  09/10/2011 1611   CO2 24 09/10/2011 1601   GLUCOSE 126* 09/10/2011 1611   BUN 9 09/10/2011 1611   CREATININE 0.90 09/10/2011 1611   CALCIUM 9.0 09/10/2011 1601   PROT 7.1 09/10/2011 1601   ALBUMIN 3.6 09/10/2011 1601   AST 43* 09/10/2011 1601   ALT 16 09/10/2011 1601   ALKPHOS 69 09/10/2011 1601   BILITOT 1.1 09/10/2011 1601   GFRNONAA 87* 09/10/2011 1601   GFRAA >90 09/10/2011 1601   COAGS Lab Results  Component Value Date   INR 0.91 09/10/2011   Lipid Panel    Component Value Date/Time   CHOL 183 09/11/2011 0425   TRIG 91 09/11/2011 0425   HDL 87 09/11/2011 0425   CHOLHDL 2.1 09/11/2011 0425   VLDL 18 09/11/2011 0425   LDLCALC 78 09/11/2011 0425   HgbA1C  No results found for this basename: HGBA1C   Urine Drug Screen  No results found for this basename: labopia,  cocainscrnur,  labbenz,  amphetmu,  thcu,  labbarb    Alcohol Level No results found for this basename: eth   CK TOTAL AND CKMB     Status: Abnormal   Collection Time   09/10/11  4:05 PM      Component Value Range   Total CK 159  7 - 232 (U/L)   CK, MB  10.5 (*) 0.3 - 4.0 (ng/mL)   Relative Index 6.6 (*) 0.0 - 2.5   TROPONIN I     Status: Abnormal   Collection Time   09/10/11  4:05 PM      Component Value Range   Troponin I 1.38 (*) <0.30 (ng/mL)    CTA CHEST   No evidence of aortic dissection or pulmonary embolism.  Subendocardial hypoperfusion along the inferior wall of the left ventricle, compatible with reported history of acute myocardial infarction.  Moderate centrilobular emphysematous changes.  CTA ABDOMEN AND PELVIS  No evidence of aortic dissection.  Small right kidney with marked cortical atrophy.    CT of the brain    1.  No acute abnormality. 2.  Atrophy and chronic microvascular ischemic change. 3.  Bilateral maxillary sinus disease.   MRI of the brain  ordered   MRA of the brain  ordered  2D Echocardiogram  ordered  Carotid Doppler  ordered   CXR  COPD.  No active  cardiopulmonary disease.  EKG  normal sinus rhythm, PVCs, Since last tracing of earlier today ST elevation in Inferior leads and anterolateral ST depression consistant with acute MI.   Physical Exam   Pleasant middle-aged Caucasian male currently not in distress. Afebrile. Head is not dramatic. Neck is supple without bruit. Cardiac exam no murmur or gallop. Lungs clear to auscultation. Abdomen soft nontender. Distal pulses are well felt. No pedal edema.  Neurological exam awake alert oriented x3 with normal speech and language function. Eye movements are full range without nystagmus. Mild psychotic dysmetria on left gaze. Face is symmetric without weakness. Tongue is midline. Motor system exam no upper or lower extremity drift symmetric and equal strength in upper extremities. Finger-to-nose and knee and coordination are slow but accurate. Plantars are downgoing. Sensation is intact. Gait was not tested.  ASSESSMENT Mr. Vincent Gilbert is a 65 y.o. 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 Status post IV tPA 09/10/11 at 1700.   Stroke risk factors:  hypertension, smoking and alcohol use  Hospital day # 1  TREATMENT/PLAN Stroke workup. MRI/A. Start aspirin 81 mg orally every day and clopidogrel 75 mg orally every day for secondary stroke prevention if post tpa imaging negative for hemorrhage. Ativan on call to MRI. TEE vs cath per cardiology.discussed with patient and Dr. Excell Seltzer. He may need elective cardiac workup for cardiac risk stratification including cardiac catheterization versus nuclear stress test. We would postpone particular static intervention for a week or 2. This patient is critically ill and at significant risk of neurological worsening, death and care requires constant monitoring of vital signs, hemodynamics,respiratory and cardiac monitoring, neurological assessment, discussion with family, other specialists and medical  decision making of high complexity. I spent 30 minutes of neurocritical care time  in the care of  this patient.  Joaquin Music, ANP-BC, GNP-BC [facility] Pager: [phone] 09/11/2011 9:01 AM  Dr. Delia Heady, Stroke Center Medical Director, has personally reviewed chart, pertinent data, examined the patient and developed the plan of care.

## 2011-09-11 NOTE — Progress Notes (Signed)
Subjective:  Pt feels better today. Speech is improved. No chest pain or dyspnea.  Objective:  Vital Signs in the last 24 hours: Temp:  [97.8 F (36.6 C)-98 F (36.7 C)] 97.8 F (36.6 C) (12/20 0400) Pulse Rate:  [59-85] 68  (12/20 0700) Resp:  [13-26] 18  (12/20 0700) BP: (105-165)/(46-92) 136/67 mmHg (12/20 0700) SpO2:  [95 %-100 %] 100 % (12/20 0700) Weight:  [54.432 kg (120 lb)-56.9 kg (125 lb 7.1 oz)] 125 lb 7.1 oz (56.9 kg) (12/19 1929)  Intake/Output from previous day: 12/19 0701 - 12/20 0700 In: 944 [I.V.:944] Out: 1025 [Urine:1025]  Physical Exam: Pt is alert and oriented, NAD HEENT: normal Neck: JVP - normal, carotids 2+= without bruits Lungs: CTA bilaterally CV: RRR with 2/6 short systolic murmur at the apex Abd: soft, NT, Positive BS, no hepatomegaly Ext: no C/C/E, distal pulses intact and equal Skin: warm/dry no rash   Lab Results:  Basename 09/10/11 1611 09/10/11 1601  WBC -- 11.4*  HGB 16.7 15.7  PLT -- 231    Basename 09/10/11 1611 09/10/11 1601  NA 131* 129*  K 4.2 4.1  CL 97 93*  CO2 -- 24  GLUCOSE 126* 124*  BUN 9 9  CREATININE 0.90 0.94    Basename 09/10/11 1605  TROPONINI 1.38*    Cardiac Studies: 2D echo to be done today  Tele: Sinus rhythm, no significant arrhythmia  Assessment/Plan:  1. Acute stroke - clinically improved after receiving TPA. Management per stroke team 2. Acute inferoposterior MI - ST segment resolution after receiving TPA. Recommend start antiplatelet Rx when OK with stroke team - suspect will need follow-up CT brain first. Continue beta-blocker and statin. 3. Lipids - cholesterol panel reviewed and lipids are good. Suspect high HDL related to Etoh. Continue statin for pleiotropic effects/plaque stabilization. 4. Dispo - Main issue from cardiac standpoint is how to proceed with coronary evaluation. Will discuss with stroke team, but I suspect pt will need a TEE as he had simultaneous stroke/MI and  cardioembolic/atheroembolism needs to be excluded. Would favor cardiac cath after that is done. If very heavy aortic athero will consider noninvasive eval and medical therapy. 2D echo to be done today. Will add cardiac markers to am labs.  Vincent Gilbert, M.D. 09/11/2011, 7:50 AM     

## 2011-09-11 NOTE — Progress Notes (Signed)
  Echocardiogram 2D Echocardiogram has been performed.  Cathie Beams Deneen 09/11/2011, 11:37 AM

## 2011-09-12 DIAGNOSIS — I2119 ST elevation (STEMI) myocardial infarction involving other coronary artery of inferior wall: Secondary | ICD-10-CM

## 2011-09-12 MED ORDER — CLOPIDOGREL BISULFATE 300 MG PO TABS
300.0000 mg | ORAL_TABLET | Freq: Once | ORAL | Status: AC
  Administered 2011-09-12: 300 mg via ORAL
  Filled 2011-09-12: qty 1

## 2011-09-12 MED ORDER — ASPIRIN EC 81 MG PO TBEC
81.0000 mg | DELAYED_RELEASE_TABLET | Freq: Every day | ORAL | Status: DC
  Administered 2011-09-12 – 2011-09-16 (×4): 81 mg via ORAL
  Filled 2011-09-12 (×5): qty 1

## 2011-09-12 MED ORDER — CLOPIDOGREL BISULFATE 75 MG PO TABS
75.0000 mg | ORAL_TABLET | Freq: Every day | ORAL | Status: DC
  Administered 2011-09-13 – 2011-09-16 (×3): 75 mg via ORAL
  Filled 2011-09-12 (×4): qty 1

## 2011-09-12 NOTE — Progress Notes (Signed)
Subjective:  Feels well. No chest pain or dyspnea. Complains of cough.   Objective:  Vital Signs in the last 24 hours: Temp:  [97.7 F (36.5 C)-99.1 F (37.3 C)] 98.7 F (37.1 C) (12/21 0817) Pulse Rate:  [56-76] 62  (12/21 0700) Resp:  [15-23] 17  (12/21 0700) BP: (97-160)/(47-74) 140/71 mmHg (12/21 0700) SpO2:  [92 %-100 %] 94 % (12/21 0700) Weight:  [59.6 kg (131 lb 6.3 oz)] 131 lb 6.3 oz (59.6 kg) (12/21 0600)  Intake/Output from previous day: 12/20 0701 - 12/21 0700 In: 1650 [I.V.:1650] Out: 1265 [Urine:1265]  Physical Exam: Pt is alert and oriented, NAD HEENT: normal Neck: JVP - normal, carotids 2+= with soft bilateral bruits Lungs: CTA bilaterally CV: RRR without murmur or gallop Abd: soft, NT, Positive BS, no hepatomegaly Ext: no C/C/E, distal pulses intact and equal Skin: warm/dry no rash   Lab Results:  Basename 09/10/11 1611 09/10/11 1601  WBC -- 11.4*  HGB 16.7 15.7  PLT -- 231    Basename 09/10/11 1611 09/10/11 1601  NA 131* 129*  K 4.2 4.1  CL 97 93*  CO2 -- 24  GLUCOSE 126* 124*  BUN 9 9  CREATININE 0.90 0.94    Basename 09/11/11 1002 09/10/11 1605  TROPONINI >25.00* 1.38*    Cardiac Studies: 2D ECHO: Study Conclusions  - Left ventricle: The cavity size was normal. Wall thickness was normal. Systolic function was normal. The estimated ejection fraction was in the range of 50% to 55%. Wall motion was normal; there were no regional wall motion abnormalities. Doppler parameters are consistent with abnormal left ventricular relaxation (grade 1 diastolic dysfunction). - Atrial septum: No defect or patent foramen ovale was identified. Impressions:  - No cardiac source of emboli was indentified.  Tele: Sinus rhythm with rare PVC's  Assessment/Plan:  1. Inferoposterior STEMI - treated with thrombolysis in setting of acute stroke with resolution of ST elevation. He has normal LV function by echo. Recommend cardiac cath with possible PCI  on Monday. Reviewed risks, indication, and alternatives with patient who understands and agrees to proceed.  Discussed case with Dr Anne Hahn of Neurology who cleared Mr Zwilling for cath/PCI from neuro standpoint. Continue ASA 81 mg and plavix 75 mg daily.  2. Acute stroke - as per neuro team.   3. Other problems as per note 12/20. Continue statin, beta-blocker.   Tonny Bollman, M.D. 09/12/2011, 8:19 AM

## 2011-09-12 NOTE — Progress Notes (Signed)
Stroke Team Progress Note  SUBJECTIVE Vincent Gilbert is an 65 y.o. male who awakened normal today. Took his sister to an appointment and upon leaving noted numbness around his mouth. He dropped his sister at home and went to the pharmacy. While awaiting her meds was noted by pharmacy staff to be slumped over and not responding appropriately. EMS was called and patient was brought in as a code stroke. Has complaints of numbness, weakness and Initial NIHSS of 7.  No family/friends at the bedside. He complains of congestion/cough, mild headache.  OBJECTIVE Most recent Vital Signs: Temp: 97.7 F (36.5 C) (12/21 0400) Temp src: Oral (12/21 0400) BP: 140/71 mmHg (12/21 0700) Pulse Rate: 62  (12/21 0700) Respiratory Rate: 17 O2 Saturdation: 94%  CBG (last 3)   Basename 09/11/11 1537 09/11/11 1305 09/10/11 1929  GLUCAP 112* 179* 106*   Intake/Output from previous day: 12/20 0701 - 12/21 0700 In: 1650 [I.V.:1650] Out: 1265 [Urine:1265]  IV Fluid Intake:      . sodium chloride 75 mL/hr at 09/12/11 0700   Medications    . aspirin  325 mg Oral Daily  . folic acid  1 mg Oral Daily  . LORazepam  0.5 mg Intravenous Once  . LORazepam  0-4 mg Oral Q6H   Followed by  . LORazepam  0-4 mg Oral Q12H  . metoprolol tartrate  50 mg Oral BID  . mulitivitamin with minerals  1 tablet Oral Daily  . pantoprazole  40 mg Oral Q1200  . rosuvastatin  40 mg Oral QPC supper  . thiamine  100 mg Oral Daily   Or  . thiamine  100 mg Intravenous Daily  . DISCONTD: pantoprazole (PROTONIX) IV  40 mg Intravenous QHS   Diet:  Cardiac thin liquids Activity:  Bedrest DVT Prophylaxis:  SCDs   Studies: CBC    Component Value Date/Time   WBC 11.4* 09/10/2011 1601   RBC 4.43 09/10/2011 1601   HGB 16.7 09/10/2011 1611   HCT 49.0 09/10/2011 1611   PLT 231 09/10/2011 1601   MCV 96.6 09/10/2011 1601   MCH 35.4* 09/10/2011 1601   MCHC 36.7* 09/10/2011 1601   RDW 12.3 09/10/2011 1601   LYMPHSABS  2.1 09/10/2011 1601   MONOABS 1.5* 09/10/2011 1601   EOSABS 0.0 09/10/2011 1601   BASOSABS 0.0 09/10/2011 1601   CMP    Component Value Date/Time   NA 131* 09/10/2011 1611   K 4.2 09/10/2011 1611   CL 97 09/10/2011 1611   CO2 24 09/10/2011 1601   GLUCOSE 126* 09/10/2011 1611   BUN 9 09/10/2011 1611   CREATININE 0.90 09/10/2011 1611   CALCIUM 9.0 09/10/2011 1601   PROT 7.1 09/10/2011 1601   ALBUMIN 3.6 09/10/2011 1601   AST 43* 09/10/2011 1601   ALT 16 09/10/2011 1601   ALKPHOS 69 09/10/2011 1601   BILITOT 1.1 09/10/2011 1601   GFRNONAA 87* 09/10/2011 1601   GFRAA >90 09/10/2011 1601   COAGS Lab Results  Component Value Date   INR 0.91 09/10/2011   Lipid Panel    Component Value Date/Time   CHOL 183 09/11/2011 0425   TRIG 91 09/11/2011 0425   HDL 87 09/11/2011 0425   CHOLHDL 2.1 09/11/2011 0425   VLDL 18 09/11/2011 0425   LDLCALC 78 09/11/2011 0425   HgbA1C  Lab Results  Component Value Date   HGBA1C 5.4 09/11/2011   Urine Drug Screen  No results found for this basename: labopia,  cocainscrnur,  labbenz,  amphetmu,  thcu,  labbarb    Alcohol Level No results found for this basename: eth   CK TOTAL AND CKMB     Status: Abnormal   Collection Time   09/10/11  4:05 PM      Component Value Range   Total CK 159  7 - 232 (U/L)   CK, MB 10.5 (*) 0.3 - 4.0 (ng/mL)   Relative Index 6.6 (*) 0.0 - 2.5   TROPONIN I     Status: Abnormal   Collection Time   09/10/11  4:05 PM      Component Value Range   Troponin I 1.38 (*) <0.30 (ng/mL)    CTA CHEST   No evidence of aortic dissection or pulmonary embolism.  Subendocardial hypoperfusion along the inferior wall of the left ventricle, compatible with reported history of acute myocardial infarction.  Moderate centrilobular emphysematous changes.  CTA ABDOMEN AND PELVIS  No evidence of aortic dissection.  Small right kidney with marked cortical atrophy.    CT of the brain    1.  No acute abnormality. 2.  Atrophy and  chronic microvascular ischemic change. 3.  Bilateral maxillary sinus disease.   MRI of the brain  Several punctate acute infarctions within the cerebellum and a single punctate acute infarction in the right parietal white matter. Distribution and size suggest embolic infarctions. No  swelling or hemorrhage. Background pattern of chronic small vessel disease elsewhere  MRA of the brain  Abnormal right vertebral artery which shows minimal thready flow and may terminate in pica. Left vertebral artery widely patent to the basilar. The right internal carotid artery is smaller than the left, raising the possibility of reduced inflow. Has there been carotid  bifurcation evaluation? Stenosis of the A1 segment of the ACA on the right.  2D Echocardiogram  EF 50-55% with no source of embolus.   Carotid Doppler  Right: 60-79% ICA stenosis, highest end of range. Left: 60-79% ICA stenosis, mid range of scale. Bilateral vertebral flow is antegrade.   CXR  COPD.  No active cardiopulmonary disease.  EKG  normal sinus rhythm, PVCs, Since last tracing of earlier today ST elevation in Inferior leads and anterolateral ST depression consistant with acute MI.   Physical Exam   Pleasant middle-aged Caucasian male currently not in distress. Afebrile. Cardiac exam no murmur or gallop. Lungs clear to auscultation. Abdomen soft nontender. Distal pulses are well felt. No pedal edema.  Neurological exam awake alert oriented x3 with normal speech and language function. Eye movements are full range without nystagmus. Mild psychotic dysmetria on left gaze. Face is symmetric without weakness. Tongue is midline. Motor system exam no upper or lower extremity drift symmetric and equal strength in upper extremities. Finger-to-nose and knee and coordination are slow but accurate. Plantars are downgoing. Gait was not tested.  ASSESSMENT [name] is a 65 y.o. 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 Status post IV tPA 09/10/11 at 1700.   Right greater than left ICA stenosis  Etoh Abuse, started on CIWA protocol 12/20  Stroke risk factors:  hypertension, smoking and alcohol use, carotid stenosis  Hospital day # 2  TREATMENT/PLAN Aspirin 81 mg orally every day and clopidogrel 75 mg orally every day for secondary stroke prevention. Cath Monday per with Dr. Excell Seltzer. He may need elective cardiac workup for cardiac risk stratification including cardiac catheterization versus nuclear stress test. We would postpone particular static intervention for a week or 2. OOB, therapy evals.  Patient has bilateral carotid vessel stenosis. The patient may require a surgical opinion in the future, once recovered from the myocardial infarction and cleared through cardiology.  Joaquin Music, ANP-BC, GNP-BC [facility] Pager: [phone] 09/12/2011 7:47 AM  Dr. Lesia Sago has personally reviewed chart, pertinent data, examined the patient and developed the plan of care.

## 2011-09-12 NOTE — Progress Notes (Signed)
Physical Therapy Evaluation Patient Details Name: Vincent Gilbert MRN: [id number] DOB: [birth year] Today's Date: 09/12/2011  Problem List:  Patient Active Problem List  Diagnoses  . Hypertension  . Tobacco abuse  . Alcohol abuse  . GERD (gastroesophageal reflux disease)  . Acute myocardial infarction of inferoposterior wall, initial episode of care    Past Medical History:  Past Medical History  Diagnosis Date  . Hypertension   . GERD (gastroesophageal reflux disease)    Past Surgical History: History reviewed. No pertinent past surgical history.  PT Assessment/Plan/Recommendation PT Assessment Clinical Impression Statement: Patient presents post CVA and MI with initial NIHSS of 7. Patient received IV tPA and now with NIHSS 0. He does not present with any physical deficits that would require skilled PT intervention. PT Recommendation/Assessment: Patent does not need any further PT services No Skilled PT: All education completed;Patient is independent with all acitivity/mobility;Patient at baseline level of functioning PT Recommendation Follow Up Recommendations: None Equipment Recommended: None recommended by PT  PT Evaluation Precautions/Restrictions   none Prior Functioning  Home Living Lives With: Spouse Receives Help From: Family Type of Home: House Home Layout: Two level (bedroom in basement) Alternate Level Stairs-Rails: Right;Left;Can reach both Alternate Level Stairs-Number of Steps: 9 Home Access: Stairs to enter Entrance Stairs-Rails: None Entrance Stairs-Number of Steps: 2 to sidewalk then 1  - 4 inch step to deck, then 1 step up Bathroom Shower/Tub: Tub/shower unit;Curtain Firefighter: Standard Bathroom Accessibility: Yes How Accessible: Accessible via walker Home Adaptive Equipment: Grab bars in shower;Hand-held shower hose;Straight cane;Walker - standard Prior Function Level of Independence: Independent with basic ADLs;Independent with  homemaking with ambulation;Independent with gait;Independent with transfers Driving: Yes Vocation: Retired Producer, television/film/video: Awake/alert Overall Cognitive Status: Appears within functional limits for tasks assessed Orientation Level: Oriented X4 Sensation/Coordination Sensation Light Touch: Appears Intact Proprioception: Appears Intact Coordination Gross Motor Movements are Fluid and Coordinated: Yes Extremity Assessment RLE Assessment RLE Assessment: Within Functional Limits LLE Assessment LLE Assessment: Within Functional Limits Mobility (including Balance) Bed Mobility Bed Mobility: Yes Rolling Left: 7: Independent Left Sidelying to Sit: 7: Independent Sitting - Scoot to Edge of Bed: 7: Independent Transfers Transfers: Yes Sit to Stand: 7: Independent;From bed;From chair/3-in-1;Without upper extremity assist Stand to Sit: 7: Independent;To chair/3-in-1;Without upper extremity assist Ambulation/Gait Ambulation/Gait: Yes Ambulation/Gait Assistance: 5: Supervision Ambulation/Gait Assistance Details (indicate cue type and reason): Initial supervision secondary to patient has been on bed rest for 48 hours and feeling of weakness. Progressed to independet with increased distance. Safe with head turns and backward gait in addition to 180/360 degree turns. Assistive device: None Gait Pattern: Within Functional Limits  Berg Balance Test Sit to Stand: Able to stand without using hands and stabilize independently Standing Unsupported: Able to stand safely 2 minutes Sitting with Back Unsupported but Feet Supported on Floor or Stool: Able to sit safely and securely 2 minutes Stand to Sit: Sits safely with minimal use of hands Transfers: Able to transfer safely, minor use of hands Standing Unsupported with Eyes Closed: Able to stand 10 seconds safely Standing Ubsupported with Feet Together: Able to place feet together independently and stand 1 minute safely From  Standing, Reach Forward with Outstretched Arm: Can reach confidently >25 cm (10") From Standing Position, Pick up Object from Floor: Able to pick up shoe safely and easily From Standing Position, Turn to Look Behind Over each Shoulder: Looks behind from both sides and weight shifts well Turn 360 Degrees: Able to turn 360 degrees safely in 4 seconds  or less Standing Unsupported, Alternately Place Feet on Step/Stool: Able to stand independently and safely and complete 8 steps in 20 seconds Standing Unsupported, One Foot in Front: Able to place foot tandem independently and hold 30 seconds Standing on One Leg: Able to lift leg independently and hold 5-10 seconds. Able to stand 10 seconds on left lower extremity but not right Total Score: 55  End of Session PT - End of Session Equipment Utilized During Treatment: Gait belt Activity Tolerance: Patient tolerated treatment well Patient left: in chair;with call bell in reach General Behavior During Session: [facility] for tasks performed Cognition: [facility] for tasks performed Discussed warning signs and stroke risk factors with patient  Edwyna Perfect, PT  Pager [phone]  09/12/2011, 10:44 AM

## 2011-09-12 NOTE — Progress Notes (Signed)
Occupational Therapy Evaluation and Discharge Patient Details Name: Vincent Gilbert MRN: [id number] DOB: April 23, 1946 Today's Date: 09/12/2011  Problem List:  Patient Active Problem List  Diagnoses  . Hypertension  . Tobacco abuse  . Alcohol abuse  . GERD (gastroesophageal reflux disease)  . Acute myocardial infarction of inferoposterior wall, initial episode of care    Past Medical History:  Past Medical History  Diagnosis Date  . Hypertension   . GERD (gastroesophageal reflux disease)    Past Surgical History: History reviewed. No pertinent past surgical history.  OT Assessment/Plan/Recommendation OT Assessment Clinical Impression Statement: Patient at baseline functioning with no residual deficits. No further acute OT needs indicated at this time- signing off.  OT Recommendation/Assessment: Patient does not need any further OT services OT Recommendation Equipment Recommended: None recommended by OT  OT Evaluation Precautions/Restrictions  Restrictions Weight Bearing Restrictions: No Prior Functioning Home Living Lives With: Spouse Receives Help From: Family Type of Home: House Home Layout: Two level (bedroom in basement) Alternate Level Stairs-Rails: Right;Left;Can reach both Alternate Level Stairs-Number of Steps: 9 Home Access: Stairs to enter Entrance Stairs-Rails: None Entrance Stairs-Number of Steps: 2 to sidewalk then 1  - 4 inch step to deck, then 1 step up Bathroom Shower/Tub: Tub/shower unit;Curtain Firefighter: Standard Bathroom Accessibility: Yes How Accessible: Accessible via walker Home Adaptive Equipment: Grab bars in shower;Hand-held shower hose;Straight cane;Walker - standard Prior Function Level of Independence: Independent with basic ADLs;Independent with homemaking with ambulation;Independent with gait;Independent with transfers Driving: Yes Vocation: Retired ADL ADL Eating/Feeding: Simulated;Independent Where Assessed -  Eating/Feeding: Edge of bed Grooming: Simulated;Independent Where Assessed - Grooming: Standing at sink Upper Body Bathing: Simulated;Independent Where Assessed - Upper Body Bathing: Standing at sink Lower Body Bathing: Simulated;Independent Where Assessed - Lower Body Bathing: Standing at sink Upper Body Dressing: Not assessed Lower Body Dressing: Performed;Independent Where Assessed - Lower Body Dressing: Sit to stand from bed Toilet Transfer: Simulated;Independent Toilet Transfer Method: Ambulating Toileting - Clothing Manipulation: Not assessed Toileting - Hygiene: Not assessed Tub/Shower Transfer: Simulated;Independent Tub/Shower Transfer Method: Science writer:  (simulated in room) ADL Comments: Patient at baseline functioning with expected fatigue/weakness from decreased mobility.  Vision/Perception  Vision - History Patient Visual Report: No change from baseline Vision - Assessment Eye Alignment: Within Functional Limits Cognition Cognition Arousal/Alertness: Awake/alert Overall Cognitive Status: Appears within functional limits for tasks assessed Orientation Level: Oriented X4 Sensation/Coordination Sensation Light Touch: Appears Intact Proprioception: Appears Intact Coordination Gross Motor Movements are Fluid and Coordinated: Yes Fine Motor Movements are Fluid and Coordinated: Yes Extremity Assessment RUE Assessment RUE Assessment: Within Functional Limits LUE Assessment LUE Assessment: Within Functional Limits Mobility  Bed Mobility Bed Mobility: Yes Rolling Left: 7: Independent Left Sidelying to Sit: 7: Independent Sitting - Scoot to Edge of Bed: 7: Independent Transfers Sit to Stand: 7: Independent;From bed;From chair/3-in-1;Without upper extremity assist Stand to Sit: 7: Independent;To chair/3-in-1;Without upper extremity assist End of Session OT - End of Session Equipment Utilized During Treatment: Gait belt Activity  Tolerance: Patient tolerated treatment well Patient left: in chair;with call bell in reach General Behavior During Session: [facility] for tasks performed Cognition: [facility] for tasks performed   Marshel Golubski 09/12/2011, 1:29 PM

## 2011-09-12 NOTE — Progress Notes (Addendum)
Cardiac rhythm changes on monitor. Monitor states Sinus Rhythm, rate 50s-60s. Pt resting comfortably. Dr. Excell Seltzer notified. Orders received for stat 12-lead EKG. Will continue to monitor.   Holly Bodily   09/12/2011 1:04 PM   Lucile Crater, PA aware of EKG results. Will continue to monitor.   Holly Bodily

## 2011-09-13 NOTE — Progress Notes (Signed)
Stroke Team Progress Note  SUBJECTIVE  Vincent Gilbert is a 65 y.o. male whose stroke symptoms occurred  After he initially  awakened normal on 09-10-11 . Took his sister to an appointment and upon leaving noted numbness around his mouth. He dropped his sister at home and went to the pharmacy. While awaiting her meds was noted by pharmacy staff to be slumped over and not responding appropriately. EMS was called and patient was brought in as a code stroke, had complaints of numbness, left facial  weakness and Initial NIHSS of 7. Today still in ICU 3100 after an acute MI was diagnosed , source and cause for his CVA in the  R MCA.   and treated with Iv- TPA  by Dr. Pearlean Brownie on the 19th of December .  Marland Kitchen    OBJECTIVE Most recent Vital Signs: Temp: 97.8 F (36.6 C) (12/22 1204) Temp src: Oral (12/22 1204) BP: 144/65 mmHg (12/22 1100) Pulse Rate: 66  (12/22 1100) Respiratory Rate: 16 O2 Saturdation: 95% CBG (last 3)   Basename 09/11/11 1537 09/11/11 1305 09/10/11 1929  GLUCAP 112* 179* 106*   Intake/Output from previous day: 12/21 0701 - 12/22 0700 In: 2680 [P.O.:880; I.V.:1800] Out: 3551 [Urine:3550; Stool:1]  IV Fluid Intake:     . sodium chloride 75 mL/hr at 09/13/11 1100   0.9 %  Medications    . aspirin EC  81 mg Oral Daily  . clopidogrel  75 mg Oral Q breakfast  . folic acid  1 mg Oral Daily  . LORazepam  0-4 mg Oral Q6H   Followed by  . LORazepam  0-4 mg Oral Q12H  . metoprolol tartrate  50 mg Oral BID  . mulitivitamin with minerals  1 tablet Oral Daily  . pantoprazole  40 mg Oral Q1200  . rosuvastatin  40 mg Oral QPC supper  . thiamine  100 mg Oral Daily   Or  . thiamine  100 mg Intravenous Daily    Activity:  Up with assistance for PT /OT evaluation     Studies: CBC    Component Value Date/Time   WBC 11.4* 09/10/2011 1601   RBC 4.43 09/10/2011 1601   HGB 16.7 09/10/2011 1611   HCT 49.0 09/10/2011 1611   PLT 231 09/10/2011 1601   MCV 96.6 09/10/2011  1601   MCH 35.4* 09/10/2011 1601   MCHC 36.7* 09/10/2011 1601   RDW 12.3 09/10/2011 1601   LYMPHSABS 2.1 09/10/2011 1601   MONOABS 1.5* 09/10/2011 1601   EOSABS 0.0 09/10/2011 1601   BASOSABS 0.0 09/10/2011 1601   CMP    Component Value Date/Time   NA 131* 09/10/2011 1611   K 4.2 09/10/2011 1611   CL 97 09/10/2011 1611   CO2 24 09/10/2011 1601   GLUCOSE 126* 09/10/2011 1611   BUN 9 09/10/2011 1611   CREATININE 0.90 09/10/2011 1611   CALCIUM 9.0 09/10/2011 1601   PROT 7.1 09/10/2011 1601   ALBUMIN 3.6 09/10/2011 1601   AST 43* 09/10/2011 1601   ALT 16 09/10/2011 1601   ALKPHOS 69 09/10/2011 1601   BILITOT 1.1 09/10/2011 1601   GFRNONAA 87* 09/10/2011 1601   GFRAA >90 09/10/2011 1601   COAGS Lab Results  Component Value Date   INR 0.91 09/10/2011   Lipid Panel    Component Value Date/Time   CHOL 183 09/11/2011 0425   TRIG 91 09/11/2011 0425   HDL 87 09/11/2011 0425   CHOLHDL 2.1 09/11/2011 0425   VLDL 18 09/11/2011 0425  LDLCALC 78 09/11/2011 0425   HgbA1C  Lab Results  Component Value Date   HGBA1C 5.4 09/11/2011   Urine Drug Screen  No results found for this basename: labopia, cocainscrnur, labbenz, amphetmu, thcu, labbarb    Alcohol Level No results found for this basename: eth     Ct Head Wo Contrast  09/11/2011  *RADIOLOGY REPORT*  Clinical Data: 24 hours post t-PA, symptoms have resolved.  CT HEAD WITHOUT CONTRAST  Technique:  Contiguous axial images were obtained from the base of the skull through the vertex without contrast.  Comparison: MRI brain 09/11/2011.  CT head 09/10/2011.  Findings: There is no evidence for acute infarction, intracranial hemorrhage, mass lesion, hydrocephalus, or extra-axial fluid. There is mild to moderate atrophy with chronic microvascular ischemic change.  The areas of infarction demonstrated on MR are not visible on CT.  There is no interval hemorrhage.  Advanced vascular calcification persists.  There is no acute sinus  disease is observed.  IMPRESSION: Unremarkable post t-PA CT scan.  No visible intracranial hemorrhage.  No demonstrable areas of acute infarction.  Original Report Authenticated By: Elsie Stain, M.D.   CTA CHEST No evidence of aortic dissection or pulmonary embolism. Subendocardial hypoperfusion along the inferior wall of the left ventricle, compatible with reported history of acute myocardial infarction. Moderate centrilobular emphysematous changes.  CTA ABDOMEN AND PELVIS No evidence of aortic dissection. Small right kidney with marked cortical atrophy.  CT of the brain 1. No acute abnormality. 2. Atrophy and chronic microvascular ischemic change. 3. Bilateral maxillary sinus disease.  MRI of the brain Several punctate acute infarctions within the cerebellum and a single punctate acute infarction in the right parietal white matter. Distribution and size suggest embolic infarctions. No  swelling or hemorrhage. Background pattern of chronic small vessel disease elsewhere  MRA of the brain Abnormal right vertebral artery which shows minimal thready flow and may terminate in pica. Left vertebral artery widely patent to the basilar. The right internal carotid artery is smaller than the left, raising the possibility of reduced inflow. Has there been carotid  bifurcation evaluation? Stenosis of the A1 segment of the ACA on the right.  2D Echocardiogram EF 50-55% with no source of embolus.  Carotid Doppler Right: 60-79% ICA stenosis, highest end of range. Left: 60-79% ICA stenosis, mid range of scale. Bilateral vertebral flow is antegrade.  CXR COPD. No active cardiopulmonary disease.  EKG normal sinus rhythm, PVCs, Since last tracing of earlier today ST elevation in Inferior leads and anterolateral ST depression consistant with  acute MI.  Physical Exam  Pleasant middle-aged Caucasian male currently not in distress.   Remaining afebrile. Cardiac exam with no murmur or gallop. Lungs clear to  auscultation.  Neurological exam  awake alert oriented x3 with normal speech and language function. Eye movements are full range without nystagmus.  Visual filed intact to finger perimetry. Saccadic  dysmetria  was elicited on left gaze.  Face is symmetric without lower droop or ptosis , Uvula and  Tongue midline.  Motor system exam :no upper or lower extremity drift symmetric and equal strength in upper extremities , no pronation and no grip weakness .  Finger-to-nose and knee and coordination are slow.  Plantars are downgoing. Gait deferred   ASSESSMENT  Mr. Tjay Velazquez is a 65 y.o. 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 Status post IV tPA 09/10/11 at 1700.  Right greater than left ICA stenosis  ETOH Abuse, started  on CIWA protocol 12/20  And has continued .  Stroke risk factors: hypertension, smoking and alcohol use, carotid stenosis    Hospital day # 3  TREATMENT/PLAN Continue ASA and Plavix  for secondary stroke prevention.  Cardiac Cath Monday with Dr Excell Seltzer .  OOB for PT/OT allowed . Carotid arteriosclerotic changes - vascular surgical consult when stabilized.   Melvyn Novas, MD [facility] 09/13/2011 12:16 PM

## 2011-09-13 NOTE — Progress Notes (Signed)
Subjective:  Feels well. No chest pain or dyspnea.   Objective:  Vital Signs in the last 24 hours: Temp:  [97.8 F (36.6 C)-98.7 F (37.1 C)] 98.3 F (36.8 C) (12/22 0800) Pulse Rate:  [56-74] 59  (12/22 0800) Resp:  [0-28] 15  (12/22 0800) BP: (125-166)/(56-87) 148/85 mmHg (12/22 0800) SpO2:  [93 %-98 %] 95 % (12/22 0800) Weight:  [129 lb 10.1 oz (58.8 kg)] 129 lb 10.1 oz (58.8 kg) (12/22 0600)  Intake/Output from previous day: 12/21 0701 - 12/22 0700 In: 2680 [P.O.:880; I.V.:1800] Out: 3551 [Urine:3550; Stool:1]  Physical Exam: Pt is alert and oriented, NAD HEENT: normal Neck: JVP - normal, carotids 2+= with soft bilateral bruits Lungs: CTA bilaterally CV: RRR without murmur or gallop Abd: soft, NT, Positive BS, no hepatomegaly Ext: no C/C/E, distal pulses intact and equal Skin: warm/dry no rash   Lab Results:  Basename 09/10/11 1611 09/10/11 1601  WBC -- 11.4*  HGB 16.7 15.7  PLT -- 231    Basename 09/10/11 1611 09/10/11 1601  NA 131* 129*  K 4.2 4.1  CL 97 93*  CO2 -- 24  GLUCOSE 126* 124*  BUN 9 9  CREATININE 0.90 0.94    Basename 09/11/11 1002 09/10/11 1605  TROPONINI >25.00* 1.38*    Cardiac Studies: 2D ECHO: Study Conclusions  - Left ventricle: The cavity size was normal. Wall thickness was normal. Systolic function was normal. The estimated ejection fraction was in the range of 50% to 55%. Wall motion was normal; there were no regional wall motion abnormalities. Doppler parameters are consistent with abnormal left ventricular relaxation (grade 1 diastolic dysfunction). - Atrial septum: No defect or patent foramen ovale was identified. Impressions:  - No cardiac source of emboli was indentified.  Tele: Sinus rhythm with rare PVC's  Assessment/Plan:  1. Inferoposterior STEMI - treated with thrombolysis in setting of acute stroke with resolution of ST elevation. He has normal LV function by echo. Recommend cardiac cath with possible PCI  on Monday. Dr. Excell Seltzer discussed case with Dr Anne Hahn of Neurology who cleared Vincent Gilbert for cath/PCI from neuro standpoint. Continue ASA 81 mg and plavix 75 mg daily. Continue beta blocker and statin.  2. Acute stroke - as per neuro team. Cerebrovascular disease; continue ASA and statin; may need surgical intervention in the future; will review with Dr Excell Seltzer need for TEE.    Vincent Gilbert, M.D. 09/13/2011, 9:31 AM

## 2011-09-14 LAB — CARDIAC PANEL(CRET KIN+CKTOT+MB+TROPI)
Relative Index: 1.9 (ref 0.0–2.5)
Troponin I: 2.48 ng/mL (ref <0.30)

## 2011-09-14 MED ORDER — DIAZEPAM 5 MG PO TABS
5.0000 mg | ORAL_TABLET | ORAL | Status: AC
  Administered 2011-09-15: 5 mg via ORAL
  Filled 2011-09-14: qty 1

## 2011-09-14 MED ORDER — SODIUM CHLORIDE 0.9 % IJ SOLN
3.0000 mL | Freq: Two times a day (BID) | INTRAMUSCULAR | Status: DC
  Administered 2011-09-14: 10 mL via INTRAVENOUS

## 2011-09-14 MED ORDER — FENTANYL CITRATE 0.05 MG/ML IJ SOLN: 250.0000 ug | Freq: Once | INTRAMUSCULAR | Status: DC

## 2011-09-14 MED ORDER — BENZOCAINE 20 % MT SOLN
1.0000 "application " | OROMUCOSAL | Status: DC | PRN
  Filled 2011-09-14: qty 57

## 2011-09-14 MED ORDER — ASPIRIN 81 MG PO CHEW
324.0000 mg | CHEWABLE_TABLET | ORAL | Status: AC
  Administered 2011-09-15: 324 mg via ORAL
  Filled 2011-09-14 (×2): qty 4

## 2011-09-14 MED ORDER — MIDAZOLAM HCL 10 MG/2ML IJ SOLN: 10.0000 mg | Freq: Once | INTRAMUSCULAR | Status: DC

## 2011-09-14 MED ORDER — SODIUM CHLORIDE 0.9 % IV SOLN
1.0000 mL/kg/h | INTRAVENOUS | Status: DC
  Administered 2011-09-15: 1 mL/kg/h via INTRAVENOUS

## 2011-09-14 MED ORDER — SODIUM CHLORIDE 0.9 % IJ SOLN: 3.0000 mL | INTRAMUSCULAR | Status: DC | PRN

## 2011-09-14 MED ORDER — SODIUM CHLORIDE 0.9 % IJ SOLN
3.0000 mL | Freq: Two times a day (BID) | INTRAMUSCULAR | Status: DC
  Administered 2011-09-14: 3 mL via INTRAVENOUS

## 2011-09-14 MED ORDER — SODIUM CHLORIDE 0.9 % IV SOLN: 250.0000 mL | INTRAVENOUS | Status: DC | PRN

## 2011-09-14 NOTE — Progress Notes (Signed)
Subjective:  Feels well. No chest pain or dyspnea.   Objective:  Vital Signs in the last 24 hours: Temp:  [97.7 F (36.5 C)-98.7 F (37.1 C)] 98.7 F (37.1 C) (12/23 0800) Pulse Rate:  [56-139] 65  (12/23 0900) Resp:  [13-22] 16  (12/23 0800) BP: (137-168)/(65-88) 150/74 mmHg (12/23 0757) SpO2:  [94 %-99 %] 97 % (12/23 0900) Weight:  [128 lb 8.5 oz (58.3 kg)] 128 lb 8.5 oz (58.3 kg) (12/23 0500)  Intake/Output from previous day: 12/22 0701 - 12/23 0700 In: 1746.3 [P.O.:1320; I.V.:426.3] Out: 3225 [Urine:3225]  Physical Exam: Pt is alert and oriented, NAD HEENT: normal Neck: JVP - normal, carotids 2+= with soft bilateral bruits Lungs: CTA bilaterally CV: RRR without murmur or gallop Abd: soft, NT, Positive BS, no hepatomegaly Ext: no C/C/E, distal pulses intact and equal Skin: warm/dry no rash   Cardiac Studies: 2D ECHO: Study Conclusions  - Left ventricle: The cavity size was normal. Wall thickness was normal. Systolic function was normal. The estimated ejection fraction was in the range of 50% to 55%. Wall motion was normal; there were no regional wall motion abnormalities. Doppler parameters are consistent with abnormal left ventricular relaxation (grade 1 diastolic dysfunction). - Atrial septum: No defect or patent foramen ovale was identified. Impressions:  - No cardiac source of emboli was indentified.    Assessment/Plan:  1. Inferoposterior STEMI - treated with thrombolysis in setting of acute stroke with resolution of ST elevation. He has normal LV function by echo. Recommend cardiac cath with possible PCI on Monday. Dr. Excell Seltzer discussed case with Dr Anne Hahn of Neurology who cleared Mr Chopin for cath/PCI from neuro standpoint. Continue ASA 81 mg and plavix 75 mg daily. Continue beta blocker and statin.  2. Acute stroke - as per neuro team. Cerebrovascular disease; continue ASA and statin; may need surgical intervention in the future; will review with Dr  Excell Seltzer need for TEE.    Olga Millers, M.D. 09/14/2011, 10:17 AM

## 2011-09-14 NOTE — Plan of Care (Signed)
Problem: Phase II Progression Outcomes Goal: Monitor D/C'd if no arrhythmias x 24 hrs Outcome: Not Applicable Date Met:  09/14/11 Pt had MI on admit as well as stroke

## 2011-09-14 NOTE — Progress Notes (Signed)
Stroke Team Progress Note  SUBJECTIVE  Vincent Gilbert is a 65 y.o. male whose stroke symptoms occurred  After he initially  awakened normal on 09-10-11 . Took his sister to an appointment and upon leaving noted numbness around his mouth. He dropped his sister at home and went to the pharmacy. While awaiting her meds was noted by pharmacy staff to be slumped over and not responding appropriately. EMS was called and patient was brought in as a code stroke, had complaints of numbness, left facial  weakness and Initial NIHSS of 7. On 09-14-11  still in ICU 3100. Admitted to STROKE MD, cardiology follows along  after an acute MI was diagnosed ,  source and cause for his CVA in the  R MCA and treated with Iv- TPA  by Dr. Pearlean Brownie on the 19th of December.  patient has now been stable for 3 days and will be transferred . Appreciate Dr Ludwig Clarks input.  .  .    OBJECTIVE Most recent Vital Signs: Temp: 98.6 F (37 C) (12/23 1133) Temp src: Oral (12/23 1133) BP: 150/74 mmHg (12/23 0757) Pulse Rate: 65  (12/23 0900) Respiratory Rate: 20 O2 Saturdation: 97% CBG (last 3)   Basename 09/11/11 1537  GLUCAP 112*   Intake/Output from previous day: 12/22 0701 - 12/23 0700 In: 1746.3 [P.O.:1320; I.V.:426.3] Out: 3225 [Urine:3225]  IV Fluid Intake:     . sodium chloride 75 mL/hr at 09/13/11 1100   0.9 %  Medications    . aspirin  324 mg Oral Pre-Cath  . aspirin EC  81 mg Oral Daily  . clopidogrel  75 mg Oral Q breakfast  . diazepam  5 mg Oral On Call  . folic acid  1 mg Oral Daily  . LORazepam  0-4 mg Oral Q6H   Followed by  . LORazepam  0-4 mg Oral Q12H  . metoprolol tartrate  50 mg Oral BID  . mulitivitamin with minerals  1 tablet Oral Daily  . pantoprazole  40 mg Oral Q1200  . rosuvastatin  40 mg Oral QPC supper  . sodium chloride  3 mL Intravenous Q12H  . thiamine  100 mg Oral Daily   Or  . thiamine  100 mg Intravenous Daily    Activity:  Up with assistance for PT /OT  evaluation     Studies: CBC    Component Value Date/Time   WBC 11.4* 09/10/2011 1601   RBC 4.43 09/10/2011 1601   HGB 16.7 09/10/2011 1611   HCT 49.0 09/10/2011 1611   PLT 231 09/10/2011 1601   MCV 96.6 09/10/2011 1601   MCH 35.4* 09/10/2011 1601   MCHC 36.7* 09/10/2011 1601   RDW 12.3 09/10/2011 1601   LYMPHSABS 2.1 09/10/2011 1601   MONOABS 1.5* 09/10/2011 1601   EOSABS 0.0 09/10/2011 1601   BASOSABS 0.0 09/10/2011 1601   CMP    Component Value Date/Time   NA 131* 09/10/2011 1611   K 4.2 09/10/2011 1611   CL 97 09/10/2011 1611   CO2 24 09/10/2011 1601   GLUCOSE 126* 09/10/2011 1611   BUN 9 09/10/2011 1611   CREATININE 0.90 09/10/2011 1611   CALCIUM 9.0 09/10/2011 1601   PROT 7.1 09/10/2011 1601   ALBUMIN 3.6 09/10/2011 1601   AST 43* 09/10/2011 1601   ALT 16 09/10/2011 1601   ALKPHOS 69 09/10/2011 1601   BILITOT 1.1 09/10/2011 1601   GFRNONAA 87* 09/10/2011 1601   GFRAA >90 09/10/2011 1601   COAGS Lab Results  Component Value  Date   INR 0.91 09/10/2011   Lipid Panel    Component Value Date/Time   CHOL 183 09/11/2011 0425   TRIG 91 09/11/2011 0425   HDL 87 09/11/2011 0425   CHOLHDL 2.1 09/11/2011 0425   VLDL 18 09/11/2011 0425   LDLCALC 78 09/11/2011 0425   HgbA1C  Lab Results  Component Value Date   HGBA1C 5.4 09/11/2011   Urine Drug Screen  No results found for this basename: labopia,  cocainscrnur,  labbenz,  amphetmu,  thcu,  labbarb    Alcohol Level No results found for this basename: eth     No results found. CTA CHEST No evidence of aortic dissection or pulmonary embolism. Subendocardial hypoperfusion along the inferior wall of the left ventricle, compatible with reported history of acute myocardial infarction. Moderate centrilobular emphysematous changes.  CTA ABDOMEN AND PELVIS No evidence of aortic dissection. Small right kidney with marked cortical atrophy.  CT of the brain 1. No acute abnormality. 2. Atrophy and chronic microvascular  ischemic change. 3. Bilateral maxillary sinus disease.  MRI of the brain Several punctate acute infarctions within the cerebellum and a single punctate acute infarction in the right parietal white matter. Distribution and size suggest embolic infarctions. No  swelling or hemorrhage. Background pattern of chronic small vessel disease elsewhere  MRA of the brain Abnormal right vertebral artery which shows minimal thready flow and may terminate in pica. Left vertebral artery widely patent to the basilar. The right internal carotid artery is smaller than the left, raising the possibility of reduced inflow. Has there been carotid  bifurcation evaluation? Stenosis of the A1 segment of the ACA on the right.  2D Echocardiogram EF 50-55% with no source of embolus.  Carotid Doppler Right: 60-79% ICA stenosis, highest end of range. Left: 60-79% ICA stenosis, mid range of scale. Bilateral vertebral flow is antegrade.  CXR COPD. No active cardiopulmonary disease.  EKG normal sinus rhythm, PVCs, Since last tracing of earlier today ST elevation in Inferior leads and anterolateral ST depression consistant with  acute MI.   Physical Exam  Pleasant middle-aged Caucasian male currently not in distress.   Remaining afebrile. Cardiac exam with no murmur or gallop. Lungs clear to auscultation.  Neurological exam  awake alert oriented x3 with normal speech and language function.  Eye movements are full range without nystagmus.  Visual filed intact to finger perimetry. Saccadic dysmetria  was elicited on left gaze.  Face is symmetric without lower droop or ptosis , Uvula and  Tongue midline.  Motor system exam :no upper or lower extremity drift symmetric and equal strength in upper extremities , no pronation and no grip weakness .  Finger-to-nose and knee and coordination are slow.  Plantars are downgoing. Gait deferred   ASSESSMENT  Mr. Vincent Gilbert is a 65 y.o. male with multiple posterior circulation  cardioembolic infarcts in the setting of acute MI. Status post IV tPA 09/10/11 at 1700.  Right greater than left ICA stenosis  ETOH Abuse, started on CIWA protocol 12/20  and  Treatment has continued .  Stroke risk factors: hypertension, smoking and alcohol abuse,  And marked carotid stenosis    Hospital day # 4  TREATMENT/PLAN Continue ASA and Plavix  for secondary stroke prevention.  Cardiac Cath Monday with Dr Excell Seltzer . TEE orders were on hold.   OOB for PT/OT allowed . Carotid arteriosclerotic changes - vascular surgical consult when stabilized.  Transfer to step down 3700  Discussed and arranged .  Vincent Mylar Goro Wenrick,  MD [facility] 09/14/2011 1:42 PM

## 2011-09-15 ENCOUNTER — Other Ambulatory Visit: Payer: Self-pay

## 2011-09-15 ENCOUNTER — Encounter (HOSPITAL_COMMUNITY)
Admission: EM | Disposition: A | Discharge status: Home / Self Care | Payer: Self-pay | Source: Home / Self Care | Attending: Neurology

## 2011-09-15 DIAGNOSIS — I2510 Atherosclerotic heart disease of native coronary artery without angina pectoris: Secondary | ICD-10-CM

## 2011-09-15 HISTORY — PX: LEFT HEART CATHETERIZATION WITH CORONARY ANGIOGRAM: SHX5451

## 2011-09-15 HISTORY — PX: CORONARY ANGIOPLASTY WITH STENT PLACEMENT: SHX49

## 2011-09-15 HISTORY — PX: PERCUTANEOUS CORONARY STENT INTERVENTION (PCI-S): SHX5485

## 2011-09-15 HISTORY — PX: CARDIAC CATHETERIZATION: SHX172

## 2011-09-15 LAB — COMPREHENSIVE METABOLIC PANEL
ALT: 14 U/L (ref 0–53)
AST: 30 U/L (ref 0–37)
Alkaline Phosphatase: 56 U/L (ref 39–117)
CO2: 29 mEq/L (ref 19–32)
Calcium: 8.8 mg/dL (ref 8.4–10.5)
Potassium: 3.4 mEq/L — ABNORMAL LOW (ref 3.5–5.1)
Sodium: 134 mEq/L — ABNORMAL LOW (ref 135–145)
Total Protein: 6.2 g/dL (ref 6.0–8.3)

## 2011-09-15 LAB — CBC
HCT: 38 % — ABNORMAL LOW (ref 39.0–52.0)
RBC: 3.86 MIL/uL — ABNORMAL LOW (ref 4.22–5.81)
RDW: 12.2 % (ref 11.5–15.5)
WBC: 7.9 10*3/uL (ref 4.0–10.5)

## 2011-09-15 LAB — PROTIME-INR: INR: 0.92 (ref 0.00–1.49)

## 2011-09-15 LAB — POCT ACTIVATED CLOTTING TIME: Activated Clotting Time: 325 seconds

## 2011-09-15 SURGERY — LEFT HEART CATHETERIZATION WITH CORONARY ANGIOGRAM
Anesthesia: LOCAL

## 2011-09-15 MED ORDER — VALSARTAN-HYDROCHLOROTHIAZIDE 320-12.5 MG PO TABS: 1.0000 | ORAL_TABLET | Freq: Every day | ORAL | Status: DC

## 2011-09-15 MED ORDER — ACETAMINOPHEN 325 MG PO TABS: 650.0000 mg | ORAL_TABLET | ORAL | Status: DC | PRN

## 2011-09-15 MED ORDER — LIDOCAINE HCL (PF) 1 % IJ SOLN
INTRAMUSCULAR | Status: AC
  Filled 2011-09-15: qty 30

## 2011-09-15 MED ORDER — NITROGLYCERIN 0.2 MG/ML ON CALL CATH LAB
INTRAVENOUS | Status: AC
  Filled 2011-09-15: qty 1

## 2011-09-15 MED ORDER — METOPROLOL SUCCINATE ER 100 MG PO TB24
100.0000 mg | ORAL_TABLET | Freq: Every day | ORAL | Status: DC
  Administered 2011-09-15 – 2011-09-16 (×2): 100 mg via ORAL
  Filled 2011-09-15 (×2): qty 1

## 2011-09-15 MED ORDER — MIDAZOLAM HCL 2 MG/2ML IJ SOLN
INTRAMUSCULAR | Status: AC
  Filled 2011-09-15: qty 2

## 2011-09-15 MED ORDER — PANTOPRAZOLE SODIUM 40 MG PO TBEC
40.0000 mg | DELAYED_RELEASE_TABLET | Freq: Every day | ORAL | Status: DC
  Administered 2011-09-15 – 2011-09-16 (×2): 40 mg via ORAL
  Filled 2011-09-15: qty 1

## 2011-09-15 MED ORDER — HYDROCHLOROTHIAZIDE 12.5 MG PO CAPS
12.5000 mg | ORAL_CAPSULE | Freq: Every day | ORAL | Status: DC
  Administered 2011-09-15 – 2011-09-16 (×2): 12.5 mg via ORAL
  Filled 2011-09-15 (×2): qty 1

## 2011-09-15 MED ORDER — SODIUM CHLORIDE 0.9 % IV SOLN: INTRAVENOUS | Status: AC

## 2011-09-15 MED ORDER — OLMESARTAN MEDOXOMIL 40 MG PO TABS
40.0000 mg | ORAL_TABLET | Freq: Every day | ORAL | Status: DC
  Administered 2011-09-15 – 2011-09-16 (×2): 40 mg via ORAL
  Filled 2011-09-15 (×2): qty 1

## 2011-09-15 MED ORDER — ONDANSETRON HCL 4 MG/2ML IJ SOLN: 4.0000 mg | Freq: Four times a day (QID) | INTRAMUSCULAR | Status: DC | PRN

## 2011-09-15 MED ORDER — GUAIFENESIN-DM 100-10 MG/5ML PO SYRP
5.0000 mL | ORAL_SOLUTION | ORAL | Status: DC | PRN
  Administered 2011-09-15 – 2011-09-16 (×2): 5 mL via ORAL
  Filled 2011-09-15 (×2): qty 5

## 2011-09-15 MED ORDER — HYDRALAZINE HCL 20 MG/ML IJ SOLN
10.0000 mg | INTRAMUSCULAR | Status: DC | PRN
  Administered 2011-09-15: 10 mg via INTRAVENOUS
  Filled 2011-09-15: qty 0.5

## 2011-09-15 MED ORDER — FENTANYL CITRATE 0.05 MG/ML IJ SOLN
INTRAMUSCULAR | Status: AC
  Filled 2011-09-15: qty 2

## 2011-09-15 MED ORDER — BIVALIRUDIN 250 MG IV SOLR
INTRAVENOUS | Status: AC
  Filled 2011-09-15: qty 250

## 2011-09-15 MED ORDER — LORATADINE 10 MG PO TABS
10.0000 mg | ORAL_TABLET | Freq: Every day | ORAL | Status: DC
  Administered 2011-09-15 – 2011-09-16 (×2): 10 mg via ORAL
  Filled 2011-09-15 (×2): qty 1

## 2011-09-15 MED ORDER — HEPARIN (PORCINE) IN NACL 2-0.9 UNIT/ML-% IJ SOLN
INTRAMUSCULAR | Status: AC
  Filled 2011-09-15: qty 2000

## 2011-09-15 MED ORDER — LEVOCETIRIZINE DIHYDROCHLORIDE 5 MG PO TABS: 5.0000 mg | ORAL_TABLET | Freq: Every evening | ORAL | Status: DC

## 2011-09-15 NOTE — Progress Notes (Signed)
Unable to ambulate pt  due to bedrest.  In anticipation for d/c on 09/16/11 education completed at bedside with pt and wife.  Risk factor modification reviewed, chest pain and Ntg protocol and to alert 911 for unresolved chest pain, tobacco cessation and heart healthy diet.  Pt agreed  for name and number to be sent to [facility] outpatient cardiac rehab program.  Pt verbalized understanding, handouts given, questions answered.

## 2011-09-15 NOTE — H&P (View-Only) (Signed)
Subjective:  Pt feels better today. Speech is improved. No chest pain or dyspnea.  Objective:  Vital Signs in the last 24 hours: Temp:  [97.8 F (36.6 C)-98 F (36.7 C)] 97.8 F (36.6 C) (12/20 0400) Pulse Rate:  [59-85] 68  (12/20 0700) Resp:  [13-26] 18  (12/20 0700) BP: (105-165)/(46-92) 136/67 mmHg (12/20 0700) SpO2:  [95 %-100 %] 100 % (12/20 0700) Weight:  [54.432 kg (120 lb)-56.9 kg (125 lb 7.1 oz)] 125 lb 7.1 oz (56.9 kg) (12/19 1929)  Intake/Output from previous day: 12/19 0701 - 12/20 0700 In: 944 [I.V.:944] Out: 1025 [Urine:1025]  Physical Exam: Pt is alert and oriented, NAD HEENT: normal Neck: JVP - normal, carotids 2+= without bruits Lungs: CTA bilaterally CV: RRR with 2/6 short systolic murmur at the apex Abd: soft, NT, Positive BS, no hepatomegaly Ext: no C/C/E, distal pulses intact and equal Skin: warm/dry no rash   Lab Results:  Basename 09/10/11 1611 09/10/11 1601  WBC -- 11.4*  HGB 16.7 15.7  PLT -- 231    Basename 09/10/11 1611 09/10/11 1601  NA 131* 129*  K 4.2 4.1  CL 97 93*  CO2 -- 24  GLUCOSE 126* 124*  BUN 9 9  CREATININE 0.90 0.94    Basename 09/10/11 1605  TROPONINI 1.38*    Cardiac Studies: 2D echo to be done today  Tele: Sinus rhythm, no significant arrhythmia  Assessment/Plan:  1. Acute stroke - clinically improved after receiving TPA. Management per stroke team 2. Acute inferoposterior MI - ST segment resolution after receiving TPA. Recommend start antiplatelet Rx when OK with stroke team - suspect will need follow-up CT brain first. Continue beta-blocker and statin. 3. Lipids - cholesterol panel reviewed and lipids are good. Suspect high HDL related to Etoh. Continue statin for pleiotropic effects/plaque stabilization. 4. Dispo - Main issue from cardiac standpoint is how to proceed with coronary evaluation. Will discuss with stroke team, but I suspect pt will need a TEE as he had simultaneous stroke/MI and  cardioembolic/atheroembolism needs to be excluded. Would favor cardiac cath after that is done. If very heavy aortic athero will consider noninvasive eval and medical therapy. 2D echo to be done today. Will add cardiac markers to am labs.  Tonny Bollman, M.D. 09/11/2011, 7:50 AM

## 2011-09-15 NOTE — Significant Event (Signed)
BP 185/81 notified DR. Dohmeir. Day shift RN Selina Cooley awaits MD call back. Vincent Gilbert [id number]

## 2011-09-15 NOTE — Progress Notes (Signed)
Angiomax discontinued at 11:00

## 2011-09-15 NOTE — Interval H&P Note (Signed)
History and Physical Interval Note:  09/15/2011 9:32 AM  Vincent Gilbert  has presented today for surgery, with the diagnosis of chest pain  The various methods of treatment have been discussed with the patient and family. After consideration of risks, benefits and other options for treatment, the patient has consented to  Procedure(s): LEFT HEART CATHETERIZATION WITH CORONARY ANGIOGRAM as a surgical intervention .  The patients' history has been reviewed, patient examined, no change in status, stable for surgery.  I have reviewed the patients' chart and labs.  Questions were answered to the patient's satisfaction.     Tonny Bollman

## 2011-09-15 NOTE — Progress Notes (Signed)
Site area: right groin  Site Prior to Removal:  Level 0  Pressure Applied For 20 MINUTES    Minutes Beginning at 1415  Manual:   yes  Patient Status During Pull:  stable  Post Pull Groin Site:  Level 1  Post Pull Instructions Given:  yes  Post Pull Pulses Present:  yes  Dressing Applied:  yes  Comments:  Pt developed small hematoma during pull pressure held

## 2011-09-15 NOTE — Progress Notes (Signed)
Subjective: Patient is alert and cooperative. The patient has had his cardiac catheterization today. Patient doing well following the catheterization. Patient denies any headache, or new numbness or weakness on the face, arms, or legs.    Objective: Vital signs in last 24 hours: Temp:  [97.9 F (36.6 C)-98.8 F (37.1 C)] 98.1 F (36.7 C) (12/24 0800) Pulse Rate:  [57-68] 68  (12/24 1341) Resp:  [16-20] 20  (12/24 0800) BP: (147-185)/(68-82) 164/78 mmHg (12/24 1402) SpO2:  [95 %-98 %] 98 % (12/24 0800) Weight change:  Last BM Date: 09/14/11  Intake/Output from previous day: 12/23 0701 - 12/24 0700 In: 540 [P.O.:540] Out: 280 [Urine:280] Intake/Output this shift:    @ROS @  The patient denies any headache, visual disturbance.  The patient denies any shortness of breath, chest pain, nausea or vomiting.  Patient denies any new numbness or weakness of the face, arms, or legs.    Physical Examination:  Lung fields are clear. Cardiovascular examination reveals a regular rate and rhythm, no obvious murmurs or rubs are noted. Extremities are without significant edema.  Mental Status Exam: The patient is alert and cooperative at the time of the examination. The patient is oriented x3.  Motor Exam: The patient moves all 4 extremities, and has good strength bilaterally.  Cerebellar Exam: The patient has good finger-nose-finger and heel-to-shin bilaterally. Gait was not tested.  Deep tendon reflexes: Deep tendon reflexes are symmetric throughout. Toes are downgoing bilaterally.  Cranial Nerve Exam: Facial symmetry is present. Extraocular movements are full. Visual fields are full. Speech is well enunciated, no aphasia or dysarthria is noted.    Lab Results:  [facility] 09/15/11 0535  WBC 7.9  HGB 13.6  HCT 38.0*  PLT 196   BMET  Basename 09/15/11 0535  NA 134*  K 3.4*  CL 97  CO2 29  GLUCOSE 89  BUN 8  CREATININE 0.71  CALCIUM 8.8    Studies/Results: No  results found.  Medications:  Scheduled:   . aspirin  324 mg Oral Pre-Cath  . aspirin EC  81 mg Oral Daily  . bivalirudin      . clopidogrel  75 mg Oral Q breakfast  . diazepam  5 mg Oral On Call  . fentaNYL      . heparin      . olmesartan  40 mg Oral Daily   And  . hydrochlorothiazide  12.5 mg Oral Daily  . lidocaine      . loratadine  10 mg Oral Daily  . metoprolol  100 mg Oral Daily  . midazolam      . nitroGLYCERIN      . pantoprazole  40 mg Oral Q1200  . pantoprazole  40 mg Oral Daily  . rosuvastatin  40 mg Oral QPC supper  . thiamine  100 mg Oral Daily  . DISCONTD: fentaNYL  250 mcg Intravenous Once  . DISCONTD: folic acid  1 mg Oral Daily  . DISCONTD: levocetirizine  5 mg Oral QPM  . DISCONTD: LORazepam  0-4 mg Oral Q12H  . DISCONTD: metoprolol tartrate  50 mg Oral BID  . DISCONTD: midazolam  10 mg Intravenous Once  . DISCONTD: mulitivitamin with minerals  1 tablet Oral Daily  . DISCONTD: sodium chloride  3 mL Intravenous Q12H  . DISCONTD: sodium chloride  3 mL Intravenous Q12H  . DISCONTD: thiamine  100 mg Intravenous Daily  . DISCONTD: valsartan-hydrochlorothiazide  1 tablet Oral Daily   Continuous:   . sodium chloride 100 mL/hr  at 09/15/11 1059  . DISCONTD: sodium chloride 1 mL/kg/hr (09/15/11 0000)   RUE:[id number], guaiFENesin, guaiFENesin-dextromethorphan, hydrALAZINE, labetalol, ondansetron (ZOFRAN) IV, DISCONTD: sodium chloride, DISCONTD: sodium chloride, DISCONTD: acetaminophen, DISCONTD: acetaminophen, DISCONTD: benzocaine, DISCONTD: ondansetron (ZOFRAN) IV, DISCONTD: senna-docusate, DISCONTD: sodium chloride, DISCONTD: sodium chloride  Assessment/Plan:  1. Cardioembolic posterior circulation stroke  2. Acute myocardial infarction  3. Hypertension  The patient currently is doing quite well without significant neurologic deficit. The patient has undergone a cardiac catheterization today. This reveals severe right coronary artery stenosis, status  post stent. Preserved left ventricular function was noted. The patient will remain on aspirin and Plavix. The patient will be mobilized tomorrow. Possible discharge tomorrow morning.    LOS: 5 days   Ella Guillotte KEITH 09/15/2011, 3:27 PM

## 2011-09-15 NOTE — Procedures (Signed)
Cardiac Catheterization Procedure Note  Name: Vincent Gilbert MRN: [id number] DOB: 01-01-1946  Procedure: Left Heart Cath, Selective Coronary Angiography, LV angiography,  PTCA/Stent of the mid right coronary artery  Indication: This is a 65 year old gentleman who presented last week with acute stroke and acute inferoposterior MI. This happened simultaneously and the patient was treated with thrombolysis. He has subsequently been loaded with aspirin and Plavix. He presents today for cardiac catheterization after resolution of his ST segment elevation. The patient has been chest pain-free since his presentation. He was cleared for cath and PCI by the neurology team.  Diagnostic Procedure Details: The right groin was prepped, draped, and anesthetized with 1% lidocaine. Using the modified Seldinger technique, a 5 French sheath was introduced into the right femoral artery. Standard Judkins catheters were used for selective coronary angiography and left ventriculography. Catheter exchanges were performed over a wire.  The diagnostic procedure was well-tolerated without immediate complications.  PROCEDURAL FINDINGS Hemodynamics: AO 171/68 LV 178/15  Coronary angiography: Coronary dominance: right  Left mainstem: Widely patent with no obstructive disease  Left anterior descending (LAD): The LAD is a large caliber vessel that reaches the left ventricular apex. The proximal LAD is widely patent and smooth. At the first septal perforator there is 20-30% LAD stenosis. The first diagonal branch is widely patent.  Left circumflex (LCx):  The left circumflex is relatively small in distribution. It supplies an atrial branch and a small intermediate branch. There is no significant obstructive disease present  Right coronary artery (RCA): This is a large, dominant vessel. The proximal vessel is widely patent. The mid vessel after the acute marginal branch is diffusely diseased and narrowed into a tight  90% stenosis. There is a long segment of stenosis present. Distally the vessel is patent with mild nonobstructive disease. It supplies a PDA branch and 2 large posterolateral branches without significant disease.  Left ventriculography: There is hypokinesis of the basal inferior wall. The overall left ventricular ejection fraction is preserved at 55-60%. There is no significant mitral regurgitation.  PCI Procedure Note:  Following the diagnostic procedure, the decision was made to proceed with PCI. The sheath was upsized to a 6 [place]. Weight-based bivalirudin was given for anticoagulation. Once a therapeutic ACT was achieved, a 6 [place] JR-4 guide catheter was inserted.  A cougar coronary guidewire was used to cross the lesion.  The lesion was predilated with a 2.5 x 20 mm balloon.  The lesion was then stented with a 3.5 x 32 mm Promus element drug-eluting stent.  Following stenting, there was an excellent angiographic result with a nice step up in the proximal edge of the stent in the step down off the distal edge of the stent. Stent appear well-expanded and was therefore not post dilated.  Following PCI, there was 0% residual stenosis and TIMI-3 flow. Final angiography confirmed an excellent result. Femoral hemostasis was achieved with manual hemostasis (this will be done in 2 hours after Angiomax wears off).  The patient tolerated the PCI procedure well. There were no immediate procedural complications.  The patient was transferred to the post catheterization recovery area for further monitoring.  PCI Data: Vessel - RCA/Segment - mid Percent Stenosis (pre)  90 TIMI-flow 3 Stent 3.5 x 32 mm Promus drug-eluting Percent Stenosis (post) 0 TIMI-flow (post) 3  Final Conclusions:   1. Severe mid RCA stenosis was successful PCI using a single drug-eluting stent 2. No significant disease in the left main, LAD, and left circumflex 3. Mild segmental LV  contraction abnormality with preserved overall LV  function  Recommendations: Aspirin and Plavix uninterrupted for 12 months, aggressive risk reduction measures, tobacco cessation. The patient will be eligible for hospital discharge tomorrow morning.  Tonny Bollman 09/15/2011, 10:24 AM

## 2011-09-16 ENCOUNTER — Other Ambulatory Visit: Payer: Self-pay

## 2011-09-16 DIAGNOSIS — I2119 ST elevation (STEMI) myocardial infarction involving other coronary artery of inferior wall: Secondary | ICD-10-CM

## 2011-09-16 LAB — BASIC METABOLIC PANEL
BUN: 13 mg/dL (ref 6–23)
GFR calc Af Amer: >90 mL/min (ref >90)
GFR calc non Af Amer: >90 mL/min (ref >90)
Potassium: 3.4 mEq/L — ABNORMAL LOW (ref 3.5–5.1)

## 2011-09-16 LAB — CBC
HCT: 36.1 % — ABNORMAL LOW (ref 39.0–52.0)
MCHC: 35.2 g/dL (ref 30.0–36.0)
Platelets: 213 10*3/uL (ref 150–400)
RDW: 12.3 % (ref 11.5–15.5)

## 2011-09-16 MED ORDER — ROSUVASTATIN CALCIUM 40 MG PO TABS: 40.0000 mg | ORAL_TABLET | Freq: Every day | ORAL | Status: DC

## 2011-09-16 MED ORDER — CLOPIDOGREL BISULFATE 75 MG PO TABS: 75.0000 mg | ORAL_TABLET | Freq: Every day | ORAL | Status: DC

## 2011-09-16 MED ORDER — ASPIRIN 81 MG PO TBEC: 81.0000 mg | DELAYED_RELEASE_TABLET | Freq: Every day | ORAL | Status: DC

## 2011-09-16 NOTE — Progress Notes (Signed)
Subjective:  Feels good. No chest pain or dyspnea. No complaints this am.  Objective:  Vital Signs in the last 24 hours: Temp:  [98.4 F (36.9 C)-99.2 F (37.3 C)] 98.4 F (36.9 C) (12/25 0934) Pulse Rate:  [61-83] 68  (12/25 0934) Resp:  [13-22] 18  (12/25 0934) BP: (107-170)/(50-82) 132/55 mmHg (12/25 0934) SpO2:  [91 %-100 %] 100 % (12/25 0934) Weight:  [55.1 kg (121 lb 7.6 oz)] 121 lb 7.6 oz (55.1 kg) (12/25 0606)  Intake/Output from previous day: 12/24 0701 - 12/25 0700 In: 480 [P.O.:480] Out: 300 [Urine:300]  Physical Exam: Pt is alert and oriented, NAD HEENT: normal Neck: JVP - normal, carotids 2+= without bruits Lungs: CTA bilaterally CV: RRR without murmur or gallop Abd: soft, NT, Positive BS, no hepatomegaly Ext: no C/C/E, distal pulses intact and equal. Right groin with small area ecchymosis but no firm hematoma or tenderness. Skin: warm/dry no rash   Lab Results:  Basename 09/16/11 0350 09/15/11 0535  WBC 8.9 7.9  HGB 12.7* 13.6  PLT 213 196    Basename 09/16/11 0350 09/15/11 0535  NA 131* 134*  K 3.4* 3.4*  CL 95* 97  CO2 28 29  GLUCOSE 94 89  BUN 13 8  CREATININE 0.79 0.71    Basename 09/14/11 1831  TROPONINI 2.48*   Tele: Sinus rhythm  Assessment/Plan:  1. Posterior circulation embolic stroke 2. Acute inferoposterior MI 3. Carotid stenosis 4. Tobacco abuse 5. Etoh abuse  Pt stable and ready for discharge. He needs 12 months of uninterrupted dual antiplatelet Rx with ASA 81 mg and plavix 75 mg daily. We discussed the importance of tobacco and complete Etoh cessation. I advised him that I don't think 'cutting back' on alcohol will be effective for him as he drinks 12 beers daily. Continue metoprolol at current dose. Consider change statin to pravastatin 80 mg at discharge - baseline lipids with LDL 78. He should follow-up in our [place] office and I will arrange this. Please call if any questions (cell [phone]), thx.   Tonny Bollman,  M.D. 09/16/2011, 10:22 AM

## 2011-09-16 NOTE — Progress Notes (Signed)
Subjective: The patient is alert and cooperative at the time of the examination. He reports no new numbness or weakness of the face, arms, or legs. He has been ambulatory without problems.  Objective: Vital signs in last 24 hours: Temp:  [98.4 F (36.9 C)-99.2 F (37.3 C)] 98.4 F (36.9 C) (12/25 0934) Pulse Rate:  [61-83] 68  (12/25 0934) Resp:  [13-22] 18  (12/25 0934) BP: (107-170)/(50-82) 132/55 mmHg (12/25 0934) SpO2:  [91 %-100 %] 100 % (12/25 0934) Weight:  [55.1 kg (121 lb 7.6 oz)] 121 lb 7.6 oz (55.1 kg) (12/25 0606) Weight change:  Last BM Date: 09/15/11  Intake/Output from previous day: 12/24 0701 - 12/25 0700 In: 480 [P.O.:480] Out: 300 [Urine:300] Intake/Output this shift:    @ROS @  The patient denies  headache, vision changes.  The patient denies problems with shortness of breath, chest pain.  The patient denies nausea vomiting.  The patient has been ambulatory without issues.    Physical Examination:  Respiratory examination is clear.  Cardiovascular examination reveals a regular rate and rhythm, no murmurs or rubs are noted.  Extremities are without significant edema. Mental Status Exam: The patient is alert and cooperative at the time of the examination. The patient is oriented x3.  Motor Exam: The patient moves all 4 extremities, and has good strength bilaterally.  Cerebellar Exam: The patient has good finger-nose-finger and heel-to-shin bilaterally. Gait was normal. Tandem gait is slightly unsteady. Romberg is negative.  Deep tendon reflexes: Deep tendon reflexes are symmetric throughout. Toes are downgoing bilaterally.  Cranial Nerve Exam: Facial symmetry is present. Extraocular movements are full. Visual fields are full. Speech is well enunciated, no aphasia or dysarthria is noted.    Lab Results:  [facility] 09/16/11 0350 09/15/11 0535  WBC 8.9 7.9  HGB 12.7* 13.6  HCT 36.1* 38.0*  PLT 213 196   BMET  Basename 09/16/11 0350 09/15/11  0535  NA 131* 134*  K 3.4* 3.4*  CL 95* 97  CO2 28 29  GLUCOSE 94 89  BUN 13 8  CREATININE 0.79 0.71  CALCIUM 8.8 8.8    Studies/Results: No results found.  Medications:  Scheduled:   . aspirin EC  81 mg Oral Daily  . clopidogrel  75 mg Oral Q breakfast  . olmesartan  40 mg Oral Daily   And  . hydrochlorothiazide  12.5 mg Oral Daily  . loratadine  10 mg Oral Daily  . metoprolol  100 mg Oral Daily  . pantoprazole  40 mg Oral Q1200  . pantoprazole  40 mg Oral Daily  . rosuvastatin  40 mg Oral QPC supper  . thiamine  100 mg Oral Daily  . DISCONTD: folic acid  1 mg Oral Daily  . DISCONTD: levocetirizine  5 mg Oral QPM  . DISCONTD: LORazepam  0-4 mg Oral Q12H  . DISCONTD: metoprolol tartrate  50 mg Oral BID  . DISCONTD: mulitivitamin with minerals  1 tablet Oral Daily  . DISCONTD: thiamine  100 mg Intravenous Daily  . DISCONTD: valsartan-hydrochlorothiazide  1 tablet Oral Daily   Continuous:   . sodium chloride 100 mL/hr at 09/15/11 1059   WUJ:WJXBJYNWGNFAO, guaiFENesin, guaiFENesin-dextromethorphan, hydrALAZINE, labetalol, ondansetron (ZOFRAN) IV, DISCONTD: acetaminophen, DISCONTD: acetaminophen, DISCONTD: ondansetron (ZOFRAN) IV, DISCONTD: senna-docusate  Assessment/Plan:  1. Cardioembolic posterior circulation stroke  2. Acute myocardial infarction  3. Hypertension  MRI evaluation has shown evidence of small punctate infarcts in the right cerebellum, and right parietal white matter. 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 2-D echocardiogram shows an ejection fraction of 50-55%. The patient did undergo a cardiac catheterization and the patient underwent stenting procedure of the mid right coronary artery with a drug-eluting stent. The patient has been placed on aspirin and Plavix. Clinically, the patient has essentially no deficit from the stroke event. Plans are to discharge the patient home with  followup to cardiology in [facility], and with [facility]. The patient is to remain on aspirin and Plavix for one year. The patient has been smoking one to one and one half packs of cigarettes daily. The patient stopped smoking at this time, and to curtail his drinking. The patient was drinking up to 12 beers daily. The patient will be discharged on Crestor. At the time of discharge, the patient is alert and cooperative, fully ambulatory, with no residual numbness or weakness of the face, arms, or legs.    LOS: 6 days   WILLIS,CHARLES KEITH 09/16/2011, 11:55 AM

## 2011-09-17 ENCOUNTER — Telehealth: Payer: Self-pay | Admitting: Cardiovascular Disease

## 2011-09-17 MED FILL — Dextrose Inj 5%: INTRAVENOUS | Qty: 50 | Status: AC

## 2011-09-17 NOTE — Telephone Encounter (Signed)
I spoke with the pt and he was not given a Rx for Diovan HCT when he left the hospital (this is written on discharge medication list, not Benicar HCT).  The pt did get a Rx called into the pharmacy today for Benicar HCT from his PCP.  Per Dr Excell Seltzer the pt can take Benicar HCT and should continue to monitor his BP at home.  Pt agreed with plan. I will update medication list.

## 2011-09-17 NOTE — Telephone Encounter (Signed)
New message: pt was put on Benicar 40 mg/hctz 12.5 while in the hospital at [facility]  They need a prescription for this called into [facility] in [place] phone number is [phone]

## 2011-09-17 NOTE — Telephone Encounter (Signed)
Left message to call back  

## 2011-10-06 ENCOUNTER — Encounter: Payer: Self-pay | Admitting: *Deleted

## 2011-10-07 ENCOUNTER — Encounter: Payer: Self-pay | Admitting: Cardiovascular Disease

## 2011-10-07 ENCOUNTER — Ambulatory Visit (INDEPENDENT_AMBULATORY_CARE_PROVIDER_SITE_OTHER): Payer: BC Managed Care – PPO | Admitting: Cardiovascular Disease

## 2011-10-07 VITALS — BP 155/90 | HR 71 | Ht 69.0 in | Wt 125.0 lb

## 2011-10-07 DIAGNOSIS — I10 Essential (primary) hypertension: Secondary | ICD-10-CM

## 2011-10-07 DIAGNOSIS — I2510 Atherosclerotic heart disease of native coronary artery without angina pectoris: Secondary | ICD-10-CM

## 2011-10-07 DIAGNOSIS — E785 Hyperlipidemia, unspecified: Secondary | ICD-10-CM

## 2011-10-07 DIAGNOSIS — I219 Acute myocardial infarction, unspecified: Secondary | ICD-10-CM

## 2011-10-07 DIAGNOSIS — Z79899 Other long term (current) drug therapy: Secondary | ICD-10-CM

## 2011-10-07 DIAGNOSIS — Z720 Tobacco use: Secondary | ICD-10-CM

## 2011-10-07 DIAGNOSIS — F17200 Nicotine dependence, unspecified, uncomplicated: Secondary | ICD-10-CM

## 2011-10-07 MED ORDER — OLMESARTAN MEDOXOMIL-HCTZ 40-12.5 MG PO TABS: 1.0000 | ORAL_TABLET | Freq: Every day | ORAL | Status: DC

## 2011-10-07 MED ORDER — CLOPIDOGREL BISULFATE 75 MG PO TABS: 75.0000 mg | ORAL_TABLET | Freq: Every day | ORAL | Status: DC

## 2011-10-07 MED ORDER — ROSUVASTATIN CALCIUM 40 MG PO TABS: 40.0000 mg | ORAL_TABLET | Freq: Every day | ORAL | Status: DC

## 2011-10-07 NOTE — Patient Instructions (Signed)
Your physician wants you to follow-up in: 3 months. You will receive a reminder letter in the mail one-two months in advance. If you don't receive a letter, please call our office to schedule the follow-up appointment. Your physician recommends that you continue on your current medications as directed. Please refer to the Current Medication list given to you today. Your physician recommends that you go to the [facility] for a FASTING lipid profile and liver function labs. Do not eat or drink after midnight. DO LABS IN 1 MONTH.  If the results of your test are normal or stable, you will receive a letter. If they are abnormal, the nurse will contact you by phone.  Your physician discussed the hazards of tobacco use. Tobacco use cessation is recommended and techniques and options to help you quit were discussed.

## 2011-10-07 NOTE — Progress Notes (Signed)
HPI  This is a 66 year old man who is here today for a followup visit. He presented on December 19 with acute stroke suspected to be in the posterior circulation. He was given tPA. His labs showed mildly elevated troponin. ECG at that point showed inferior ST elevation with reciprocal changes. The patient was not a candidate for invasive cardiac evaluation at that moment. He was stabilized medically. He ultimately underwent cardiac catheterization before hospital discharge which showed a 90% stenosis in the mid right coronary artery. He had an angioplasty and drug-eluting stent placement without complications. He recovered very well from his stroke and myocardial infarction. Since his hospital discharge, he has been doing very well. He denies any chest pain or dyspnea. He is trying to quit smoking and currently down to a few cigarettes a day. He denies any palpitations, dizziness or syncope.  Allergies  Allergen Reactions  . Codeine Other (See Comments)    Heart fluttering     Current Outpatient Prescriptions on File Prior to Visit  Medication Sig Dispense Refill  . aspirin EC 81 MG EC tablet Take 1 tablet (81 mg total) by mouth daily.  30 tablet  3  . clopidogrel (PLAVIX) 75 MG tablet Take 1 tablet (75 mg total) by mouth daily with breakfast.  30 tablet  3  . metoprolol (TOPROL-XL) 100 MG 24 hr tablet Take 100 mg by mouth daily.        Marland Kitchen olmesartan-hydrochlorothiazide (BENICAR HCT) 40-12.5 MG per tablet Take 1 tablet by mouth daily.      . pantoprazole (PROTONIX) 40 MG tablet Take 40 mg by mouth daily.        . rosuvastatin (CRESTOR) 40 MG tablet Take 1 tablet (40 mg total) by mouth daily after supper.  30 tablet  2     Past Medical History  Diagnosis Date  . Hypertension   . GERD (gastroesophageal reflux disease)   . Coronary artery disease   . Stroke 09/10/2011     Past Surgical History  Procedure Date  . Cardiac catheterization 09-15-2011    90% mid RCA stenosis.   . Coronary  angioplasty with stent placement 09-15-2011    mid RCA: 3.5 X32 mm Promus DES     No family history on file.   History   Social History  . Marital Status: Married    Spouse Name: N/A    Number of Children: N/A  . Years of Education: N/A   Occupational History  . Not on file.   Social History Main Topics  . Smoking status: Current Everyday Smoker -- 1.5 packs/day for 40 years    Types: Cigarettes  . Smokeless tobacco: Never Used   Comment: now smokes 2-3 cigarettes/day now, trying to quit  . Alcohol Use: Yes     2-3x/ wk  . Drug Use: Not on file  . Sexually Active: Not on file   Other Topics Concern  . Not on file   Social History Narrative  . No narrative on file     PHYSICAL EXAM   BP 155/90  Pulse 71  Ht 5\' 9"  (1.753 m)  Wt 125 lb (56.7 kg)  BMI 18.46 kg/m2  Constitutional: He is oriented to person, place, and time. He appears well-developed and well-nourished. No distress.  HENT: No nasal discharge.  Head: Normocephalic and atraumatic.  Eyes: Pupils are equal, round, and reactive to light. Right eye exhibits no discharge. Left eye exhibits no discharge.  Neck: Normal range of motion. Neck supple. No  JVD present. No thyromegaly present.  Cardiovascular: Normal rate, regular rhythm, normal heart sounds. Exam reveals no gallop and no friction rub.  No murmur heard.  Pulmonary/Chest: Effort normal and breath sounds normal. No stridor. No respiratory distress. He has no wheezes. He has no rales. He exhibits no tenderness.  Abdominal: Soft. Bowel sounds are normal. He exhibits no distension. There is no tenderness. There is no rebound and no guarding.  Musculoskeletal: Normal range of motion. He exhibits no edema and no tenderness.  Neurological: He is alert and oriented to person, place, and time. Coordination normal.  Skin: Skin is warm and dry. No rash noted. He is not diaphoretic. No erythema. No pallor.  Psychiatric: He has a normal mood and affect. His  behavior is normal. Judgment and thought content normal.  Right groin: No evidence of hematoma. There is a superficial bruise. The patient has bilateral femoral bruits.     EKG: Normal sinus rhythm with T-wave inversion in the inferior leads as well as hyperacute T waves in the anterior leads. I reviewed his ECG during hospitalization. It appears that he had this pattern on some of the EKGs during his hospitalization.   ASSESSMENT AND PLAN

## 2011-10-07 NOTE — Assessment & Plan Note (Signed)
The patient had a recent inferior myocardial infarction after he presented with a stroke and treated with TPA. His cardiac catheterization showed severe mid RCA stenosis. He underwent an angioplasty and drug-eluting stent placement without complications. He is not having any symptoms of angina at this time. Continue dual antiplatelet therapy for at least one year. I advised him to attend cardiac rehabilitation but he wants to do it on his own. I discussed with him the importance of lifestyle changes. He does not have hyperlipidemia but he would definitely benefit from a statin for secondary prevention. I will request a fasting lipid and liver profile in one month from now.

## 2011-10-07 NOTE — Assessment & Plan Note (Signed)
He is trying to quit smoking and down to a few cigarettes a day.

## 2011-10-07 NOTE — Assessment & Plan Note (Signed)
His blood pressure is elevated today. Continue to monitor this. Will consider adding amlodipine if it remains elevated.

## 2011-11-18 ENCOUNTER — Telehealth: Payer: Self-pay | Admitting: *Deleted

## 2011-11-18 NOTE — Telephone Encounter (Signed)
Message copied by Eustace Moore on Tue Nov 18, 2011  4:53 PM ------      Message from: Lorine Bears A      Created: Fri Nov 14, 2011  4:26 PM       His cholesterol is good. Continue Crestor.

## 2011-11-18 NOTE — Telephone Encounter (Signed)
Patient informed. 

## 2012-01-06 ENCOUNTER — Ambulatory Visit (INDEPENDENT_AMBULATORY_CARE_PROVIDER_SITE_OTHER): Payer: BC Managed Care – PPO | Admitting: Cardiology

## 2012-01-06 ENCOUNTER — Encounter: Payer: Self-pay | Admitting: Cardiology

## 2012-01-06 VITALS — BP 148/82 | HR 89 | Ht 69.0 in | Wt 120.0 lb

## 2012-01-06 DIAGNOSIS — F17200 Nicotine dependence, unspecified, uncomplicated: Secondary | ICD-10-CM

## 2012-01-06 DIAGNOSIS — R0989 Other specified symptoms and signs involving the circulatory and respiratory systems: Secondary | ICD-10-CM

## 2012-01-06 DIAGNOSIS — Z720 Tobacco use: Secondary | ICD-10-CM

## 2012-01-06 DIAGNOSIS — I2510 Atherosclerotic heart disease of native coronary artery without angina pectoris: Secondary | ICD-10-CM

## 2012-01-06 DIAGNOSIS — I10 Essential (primary) hypertension: Secondary | ICD-10-CM

## 2012-01-06 MED ORDER — METOPROLOL SUCCINATE ER 50 MG PO TB24: 50.0000 mg | ORAL_TABLET | Freq: Every day | ORAL | Status: DC

## 2012-01-06 MED ORDER — METOPROLOL SUCCINATE ER 100 MG PO TB24: 100.0000 mg | ORAL_TABLET | Freq: Every day | ORAL | Status: DC

## 2012-01-06 NOTE — Assessment & Plan Note (Signed)
We discussed a specific strategy for tobacco cessation.  (Greater than three minutes discussing tobacco cessation.)  

## 2012-01-06 NOTE — Progress Notes (Signed)
   HPI The patient presents for followup of known coronary disease. Since his PCI late last year he has done well. He presented with a stroke and never really had chest discomfort. He still denies chest pressure, neck or arm discomfort. He denies any palpitations, presyncope or syncope. He has no PND or orthopnea. He works vigorously and cannot bring on any symptoms. He said he had no residual symptoms from his stroke.  Allergies  Allergen Reactions  . Codeine Other (See Comments)    Heart fluttering    Current Outpatient Prescriptions  Medication Sig Dispense Refill  . aspirin EC 81 MG EC tablet Take 1 tablet (81 mg total) by mouth daily.  30 tablet  3  . azelastine (ASTELIN) 137 MCG/SPRAY nasal spray Place 1 spray into the nose Twice daily.      . clopidogrel (PLAVIX) 75 MG tablet Take 1 tablet (75 mg total) by mouth daily with breakfast.  90 tablet  3  . metoprolol (TOPROL-XL) 100 MG 24 hr tablet Take 100 mg by mouth daily.        Marland Kitchen olmesartan-hydrochlorothiazide (BENICAR HCT) 40-12.5 MG per tablet Take 1 tablet by mouth daily.  90 tablet  3  . pantoprazole (PROTONIX) 40 MG tablet Take 40 mg by mouth daily.        . rosuvastatin (CRESTOR) 40 MG tablet Take 1 tablet (40 mg total) by mouth daily after supper.  90 tablet  3    Past Medical History  Diagnosis Date  . Hypertension   . GERD (gastroesophageal reflux disease)   . Coronary artery disease   . Stroke 09/10/2011    Past Surgical History  Procedure Date  . Cardiac catheterization 09-15-2011    90% mid RCA stenosis. Normal EF  . Coronary angioplasty with stent placement 09-15-2011    mid RCA: 3.5 X32 mm Promus DES    ROS:  As stated in the HPI and negative for all other systems.  PHYSICAL EXAM BP 148/82  Pulse 89  Ht 5\' 9"  (1.753 m)  Wt 120 lb (54.432 kg)  BMI 17.72 kg/m2 GENERAL:  Well appearing HEENT:  Pupils equal round and reactive, fundi not visualized, oral mucosa unremarkable NECK:  No jugular venous  distention, waveform within normal limits, carotid upstroke brisk and symmetric, left bruits, no thyromegaly LYMPHATICS:  No cervical, inguinal adenopathy LUNGS:  Clear to auscultation bilaterally BACK:  No CVA tenderness CHEST:  Unremarkable HEART:  PMI not displaced or sustained,S1 and S2 within normal limits, no S3, no S4, no clicks, no rubs, no murmurs ABD:  Flat, positive bowel sounds normal in frequency in pitch, midline abdominal bruits, no rebound, no guarding, no midline pulsatile mass, no hepatomegaly, no splenomegaly EXT:  2 plus pulses throughout, no edema, no cyanosis no clubbing SKIN:  No rashes no nodules NEURO:  Cranial nerves II through XII grossly intact, motor grossly intact throughout PSYCH:  Cognitively intact, oriented to person place and time   ASSESSMENT AND PLAN

## 2012-01-06 NOTE — Patient Instructions (Signed)
Your physician wants you to follow-up in: 6 months. You will receive a reminder letter in the mail one-two months in advance. If you don't receive a letter, please call our office to schedule the follow-up appointment. Your physician recommends that you continue on your current medications as directed. Please refer to the Current Medication list given to you today. Your physician has requested that you have an abdominal aorta duplex. During this test, an ultrasound is used to evaluate the aorta. Allow 30 minutes for this exam. Do not eat after midnight the day before and avoid carbonated beverages  Your physician has requested that you have a carotid duplex. This test is an ultrasound of the carotid arteries in your neck. It looks at blood flow through these arteries that supply the brain with blood. Allow one hour for this exam. There are no restrictions or special instructions.  If the results of your test are normal or stable, you will receive a letter. If they are abnormal, the nurse will contact you by phone.

## 2012-01-06 NOTE — Assessment & Plan Note (Signed)
I will increase his metoprolol to 150 mg daily.

## 2012-01-06 NOTE — Assessment & Plan Note (Signed)
The patient has no new sypmtoms.  No further cardiovascular testing is indicated.  We will continue with aggressive risk reduction and meds as listed.  He is to remain on Plavix at least one year from the time of his PCI.

## 2012-01-06 NOTE — Assessment & Plan Note (Signed)
There was some suggestion of carotid stenosis from his MRA. There's not a carotid Doppler and I will order this. I will also order an abdominal ultrasound given his abdominal bruit.

## 2012-02-23 ENCOUNTER — Other Ambulatory Visit: Payer: Self-pay | Admitting: Cardiology

## 2012-02-23 MED ORDER — METOPROLOL SUCCINATE ER 50 MG PO TB24: 50.0000 mg | ORAL_TABLET | Freq: Every day | ORAL | Status: DC

## 2012-02-26 ENCOUNTER — Encounter (INDEPENDENT_AMBULATORY_CARE_PROVIDER_SITE_OTHER): Payer: BC Managed Care – PPO

## 2012-02-26 DIAGNOSIS — R0989 Other specified symptoms and signs involving the circulatory and respiratory systems: Secondary | ICD-10-CM

## 2012-02-26 DIAGNOSIS — I700 Atherosclerosis of aorta: Secondary | ICD-10-CM

## 2012-03-04 ENCOUNTER — Other Ambulatory Visit: Payer: Self-pay | Admitting: *Deleted

## 2012-03-04 ENCOUNTER — Other Ambulatory Visit: Payer: Self-pay

## 2012-03-04 ENCOUNTER — Encounter (INDEPENDENT_AMBULATORY_CARE_PROVIDER_SITE_OTHER): Payer: BC Managed Care – PPO

## 2012-03-04 DIAGNOSIS — I6529 Occlusion and stenosis of unspecified carotid artery: Secondary | ICD-10-CM

## 2012-03-04 DIAGNOSIS — R0989 Other specified symptoms and signs involving the circulatory and respiratory systems: Secondary | ICD-10-CM

## 2012-03-05 ENCOUNTER — Encounter: Payer: BC Managed Care – PPO | Admitting: Vascular Surgery

## 2012-03-05 ENCOUNTER — Other Ambulatory Visit: Payer: BC Managed Care – PPO

## 2012-03-05 ENCOUNTER — Encounter: Payer: Self-pay | Admitting: Vascular Surgery

## 2012-03-08 ENCOUNTER — Encounter: Payer: Self-pay | Admitting: Vascular Surgery

## 2012-03-08 ENCOUNTER — Ambulatory Visit (INDEPENDENT_AMBULATORY_CARE_PROVIDER_SITE_OTHER): Payer: BC Managed Care – PPO | Admitting: Vascular Surgery

## 2012-03-08 ENCOUNTER — Other Ambulatory Visit (INDEPENDENT_AMBULATORY_CARE_PROVIDER_SITE_OTHER): Payer: BC Managed Care – PPO | Admitting: *Deleted

## 2012-03-08 ENCOUNTER — Other Ambulatory Visit: Payer: Self-pay | Admitting: *Deleted

## 2012-03-08 VITALS — BP 199/101 | HR 67 | Resp 18 | Ht 69.0 in | Wt 120.0 lb

## 2012-03-08 DIAGNOSIS — I6529 Occlusion and stenosis of unspecified carotid artery: Secondary | ICD-10-CM

## 2012-03-08 DIAGNOSIS — I63239 Cerebral infarction due to unspecified occlusion or stenosis of unspecified carotid arteries: Secondary | ICD-10-CM

## 2012-03-08 NOTE — Progress Notes (Signed)
Subjective:     Patient ID: Vincent Gilbert, male   DOB: 08/16/1946, 66 y.o.   MRN: [id number]  HPI this 66-year-old male patient was referred for carotid occlusive disease. He suffered a stroke which sounded like a left brain event with aphasia, left visual changes, and right upper extremity numbness. These symptoms all cleared within 24 hours. He did receive TPA from the stroke team. He was found to have severe bilateral carotid occlusive disease right worse than left. He was referred for further evaluation. He has no previous history of stroke or TIAs  Past Medical History  Diagnosis Date  . Hypertension   . GERD (gastroesophageal reflux disease)   . Coronary artery disease   . Stroke 09/10/2011    History  Substance Use Topics  . Smoking status: Current Everyday Smoker -- 1.5 packs/day for 40 years    Types: Cigarettes  . Smokeless tobacco: Never Used   Comment: now smokes 8-9 cigarettes/day now, trying to quit  . Alcohol Use: Yes     2-3x/ wk    Family History  Problem Relation Age of Onset  . Heart disease Mother   . Heart disease Father     Allergies  Allergen Reactions  . Codeine Other (See Comments)    Heart fluttering    Current outpatient prescriptions:aspirin EC 81 MG EC tablet, Take 1 tablet (81 mg total) by mouth daily., Disp: 30 tablet, Rfl: 3;  azelastine (ASTELIN) 137 MCG/SPRAY nasal spray, Place 1 spray into the nose Twice daily., Disp: , Rfl: ;  clopidogrel (PLAVIX) 75 MG tablet, Take 1 tablet (75 mg total) by mouth daily with breakfast., Disp: 90 tablet, Rfl: 3 metoprolol succinate (TOPROL XL) 50 MG 24 hr tablet, Take 1 tablet (50 mg total) by mouth daily. Dose increased on 01/06/2012 to a total of 150mg per day., Disp: 90 tablet, Rfl: 1;  metoprolol succinate (TOPROL-XL) 100 MG 24 hr tablet, Take 1 tablet (100 mg total) by mouth daily., Disp: 30 tablet, Rfl: 6;  olmesartan-hydrochlorothiazide (BENICAR HCT) 40-12.5 MG per tablet, Take 1 tablet by mouth  daily., Disp: 90 tablet, Rfl: 3 pantoprazole (PROTONIX) 40 MG tablet, Take 40 mg by mouth daily.  , Disp: , Rfl: ;  rosuvastatin (CRESTOR) 40 MG tablet, Take 1 tablet (40 mg total) by mouth daily after supper., Disp: 90 tablet, Rfl: 3  BP 199/101  Pulse 67  Resp 18  Ht 5' 9" (1.753 m)  Wt 120 lb (54.432 kg)  BMI 17.72 kg/m2  Body mass index is 17.72 kg/(m^2).        Review of Systems denies chest pain, dyspnea on exertion, PND, orthopnea, hemoptysis, claudication. All other systems are negative and a complete review assist     Objective:   Physical Exam blood pressure 199/100 heart rate 67 respirations 18 Gen.-alert and oriented x3 in no apparent distress HEENT normal for age Lungs no rhonchi or wheezing Cardiovascular regular rhythm no murmurs carotid pulses 3+ palpable no bruits audible Abdomen soft nontender no palpable masses Musculoskeletal free of  major deformities Skin clear -no rashes Neurologic normal Lower extremities 3+ femoral and dorsalis pedis pulses palpable bilaterally with no edema  I reviewed the medical information provided as well as the carotid duplex exam and we repeated a carotid duplex exam today for comparison I reviewed and interpreted the study. My opinion the patient has a 90+ percent right internal carotid stenosis and a 70% left internal carotid stenosis.    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 Jeff Katz who feels patient is stable from a cardiology standpoint to proceed with surgery Believe we should proceed with right carotid endarterectomy initially because of severity of lesion to be followed in the near future by left  carotid endarterectomy. We'll continue Plavix. Patient's wife is in hospital with infectious process and he will need to determine if he can have this surgery done later this week as I have recommended and he will be back with us to schedule      

## 2012-03-09 ENCOUNTER — Encounter (HOSPITAL_COMMUNITY): Payer: Self-pay | Admitting: Pharmacy Technician

## 2012-03-10 ENCOUNTER — Encounter (HOSPITAL_COMMUNITY)
Admission: RE | Admit: 2012-03-10 | Discharge: 2012-03-10 | Disposition: A | Discharge status: Home / Self Care | Payer: BC Managed Care – PPO | Source: Ambulatory Visit | Attending: Vascular Surgery | Admitting: Vascular Surgery

## 2012-03-10 ENCOUNTER — Encounter (HOSPITAL_COMMUNITY): Payer: Self-pay

## 2012-03-10 ENCOUNTER — Other Ambulatory Visit (HOSPITAL_COMMUNITY): Payer: BC Managed Care – PPO

## 2012-03-10 HISTORY — DX: Acute myocardial infarction, unspecified: I21.9

## 2012-03-10 HISTORY — DX: Cardiac murmur, unspecified: R01.1

## 2012-03-10 HISTORY — DX: Occlusion and stenosis of unspecified carotid artery: I65.29

## 2012-03-10 LAB — URINALYSIS, ROUTINE W REFLEX MICROSCOPIC
Bilirubin Urine: NEGATIVE
Glucose, UA: NEGATIVE mg/dL
Hgb urine dipstick: NEGATIVE
Protein, ur: 100 mg/dL — AB

## 2012-03-10 LAB — COMPREHENSIVE METABOLIC PANEL
ALT: 20 U/L (ref 0–53)
AST: 38 U/L — ABNORMAL HIGH (ref 0–37)
Albumin: 3.8 g/dL (ref 3.5–5.2)
Chloride: 90 mEq/L — ABNORMAL LOW (ref 96–112)
Creatinine, Ser: 0.86 mg/dL (ref 0.50–1.35)
Potassium: 3.6 mEq/L (ref 3.5–5.1)
Sodium: 129 mEq/L — ABNORMAL LOW (ref 135–145)
Total Bilirubin: 0.4 mg/dL (ref 0.3–1.2)

## 2012-03-10 LAB — PROTIME-INR
INR: 0.94 (ref 0.00–1.49)
Prothrombin Time: 12.8 seconds (ref 11.6–15.2)

## 2012-03-10 LAB — SURGICAL PCR SCREEN: MRSA, PCR: NEGATIVE

## 2012-03-10 LAB — CBC
MCV: 96.9 fL (ref 78.0–100.0)
Platelets: 243 10*3/uL (ref 150–400)
RBC: 3.9 MIL/uL — ABNORMAL LOW (ref 4.22–5.81)
RDW: 12.4 % (ref 11.5–15.5)
WBC: 7.1 10*3/uL (ref 4.0–10.5)

## 2012-03-10 LAB — APTT: aPTT: 29 seconds (ref 24–37)

## 2012-03-10 LAB — PREPARE RBC (CROSSMATCH)

## 2012-03-10 MED ORDER — CEFUROXIME SODIUM 1.5 G IJ SOLR
1.5000 g | INTRAMUSCULAR | Status: AC
  Administered 2012-03-11: 1.5 g via INTRAVENOUS
  Filled 2012-03-10: qty 1.5

## 2012-03-10 NOTE — Progress Notes (Addendum)
Dopplers 6/13, echo 12/12 , ekg 1/13 in epic, note from dr hochrein 4/13

## 2012-03-10 NOTE — Consult Note (Signed)
Anesthesia Chart Review:  Patient is a 66 year old male scheduled for right CEA on 03/11/12 by Dr. Hart Rochester.  His PAT visit was on the afternoon of 03/10/12.  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  He is s/p DES RCA on 09/15/11. He was moderately severe (70%) left ICA stenosis and > 90% right ICA stenosis by recent carotid duplex.Marland Kitchen  His Cardiologist is Dr. Antoine Poche.  He is suppose to remain on Plavix for one year following his stent placement.  Dr. Hart Rochester documented that he is planning to keep Mr. Leggette on Plavix peri-operatively, and he spoke with Dr. Myrtis Ser who felt patient was stable from a Cardiology standpoint.   His last EKG was from 10/07/11 and showed NSR with inferior T wave inversion and hyperacute T waves in anterior leads.  This was similar to his 09/14/12 EKG, but not really seen on his 09/15/12 EKG.  I will go a head and order a repeat EKG on arrival, so we'll have a better baseline EKG pre-operatively now that he is six months out from his MI.  Echo on 09/11/11 showed: - Left ventricle: The cavity size was normal. Wall thickness was normal. Systolic function was normal. The estimated ejection fraction was in the range of 50% to 55%. Wall motion was normal; there were no regional wall motion abnormalities. Doppler parameters are consistent with abnormal left ventricular relaxation (grade 1 diastolic dysfunction). - Atrial septum: No defect or patent foramen ovale was Identified. - Mild Tricuspid valve regurgitation.  Cardiac cath on 09/15/11 showed: 1. Severe mid RCA stenosis was successful PCI using a single drug-eluting stent  2. No significant disease in the left main, LAD, and left circumflex  3. Mild segmental LV contraction abnormality with preserved overall LV function. (See Notes tab "Procedures" for complete report.)  CXR on 03/10/12 showed: Pulmonary hyperinflation. Apical scarring with stable  nodular  opacity adjacent to or associated with the right first rib. No  acute cardiopulmonary abnormality.  Labs noted.  Na 129.  Will check an ISTAT to ensure stability.   His BP is elevated today at 182/89.  (It was 148/82 at his last Cardiology visit on 01/06/12.  Toprol was increased at that time.)  He is also on Benicar HCT. Re-evaluate BP on arrival.  Shonna Chock, PA-C

## 2012-03-10 NOTE — Pre-Procedure Instructions (Addendum)
20 GRAYCEN SADLON  03/10/2012   Your procedure is scheduled on:  03/11/12  Report to [facility] at530AM.  Call this number if you have problems the morning of surgery: [phone]   Remember:   Do not eat food or drink:After Midnight.    Take these medicines the morning of surgery with A SIP OF WATER: nasal spray, metoprolol, protonix,aspirin STOP plavix, aleve    Do not wear jewelry, make-up or nail polish.  Do not wear lotions, powders, or perfumes. You may wear deodorant.  Do not shave 48 hours prior to surgery. Men may shave face and neck.  Do not bring valuables to the hospital.  Contacts, dentures or bridgework may not be worn into surgery.  Leave suitcase in the car. After surgery it may be brought to your room.  For patients admitted to the hospital, checkout time is 11:00 AM the day of discharge.   Patients discharged the day of surgery will not be allowed to drive home.  Name and phone number of your driver: dtr  [phone] wk , [phone] cell  Special Instructions: CHG Shower Use Special Wash: 1/2 bottle night before surgery and 1/2 bottle morning of surgery.   Please read over the following fact sheets that you were given: Pain Booklet, Coughing and Deep Breathing, Blood Transfusion Information, MRSA Information and Surgical Site Infection Prevention

## 2012-03-11 ENCOUNTER — Encounter (HOSPITAL_COMMUNITY): Payer: Self-pay | Admitting: *Deleted

## 2012-03-11 ENCOUNTER — Encounter (HOSPITAL_COMMUNITY)
Admission: RE | Disposition: A | Discharge status: Home / Self Care | Payer: Self-pay | Source: Ambulatory Visit | Attending: Vascular Surgery

## 2012-03-11 ENCOUNTER — Inpatient Hospital Stay (HOSPITAL_COMMUNITY)
Admission: RE | Admit: 2012-03-11 | Discharge: 2012-03-12 | DRG: 531 | Disposition: A | Discharge status: Home / Self Care | Payer: BC Managed Care – PPO | Source: Ambulatory Visit | Attending: Vascular Surgery | Admitting: Vascular Surgery

## 2012-03-11 ENCOUNTER — Encounter (HOSPITAL_COMMUNITY): Payer: Self-pay | Admitting: Vascular Surgery

## 2012-03-11 ENCOUNTER — Ambulatory Visit (HOSPITAL_COMMUNITY): Payer: BC Managed Care – PPO | Admitting: Vascular Surgery

## 2012-03-11 ENCOUNTER — Telehealth: Payer: Self-pay | Admitting: Vascular Surgery

## 2012-03-11 DIAGNOSIS — Z9861 Coronary angioplasty status: Secondary | ICD-10-CM

## 2012-03-11 DIAGNOSIS — Z885 Allergy status to narcotic agent status: Secondary | ICD-10-CM

## 2012-03-11 DIAGNOSIS — I2510 Atherosclerotic heart disease of native coronary artery without angina pectoris: Secondary | ICD-10-CM | POA: Diagnosis present

## 2012-03-11 DIAGNOSIS — F17200 Nicotine dependence, unspecified, uncomplicated: Secondary | ICD-10-CM | POA: Diagnosis present

## 2012-03-11 DIAGNOSIS — I10 Essential (primary) hypertension: Secondary | ICD-10-CM | POA: Diagnosis present

## 2012-03-11 DIAGNOSIS — R0989 Other specified symptoms and signs involving the circulatory and respiratory systems: Secondary | ICD-10-CM | POA: Diagnosis present

## 2012-03-11 DIAGNOSIS — R011 Cardiac murmur, unspecified: Secondary | ICD-10-CM | POA: Diagnosis present

## 2012-03-11 DIAGNOSIS — K219 Gastro-esophageal reflux disease without esophagitis: Secondary | ICD-10-CM | POA: Diagnosis present

## 2012-03-11 DIAGNOSIS — F1010 Alcohol abuse, uncomplicated: Secondary | ICD-10-CM | POA: Diagnosis present

## 2012-03-11 DIAGNOSIS — I6320 Cerebral infarction due to unspecified occlusion or stenosis of unspecified precerebral arteries: Principal | ICD-10-CM | POA: Diagnosis present

## 2012-03-11 DIAGNOSIS — I63239 Cerebral infarction due to unspecified occlusion or stenosis of unspecified carotid arteries: Secondary | ICD-10-CM

## 2012-03-11 DIAGNOSIS — I6529 Occlusion and stenosis of unspecified carotid artery: Secondary | ICD-10-CM

## 2012-03-11 DIAGNOSIS — I252 Old myocardial infarction: Secondary | ICD-10-CM

## 2012-03-11 HISTORY — PX: ENDARTERECTOMY: SHX5162

## 2012-03-11 LAB — POCT I-STAT 4, (NA,K, GLUC, HGB,HCT): Hemoglobin: 14.3 g/dL (ref 13.0–17.0)

## 2012-03-11 SURGERY — ENDARTERECTOMY, CAROTID
Anesthesia: General | Site: Neck | Laterality: Right | Wound class: Clean

## 2012-03-11 MED ORDER — ROCURONIUM BROMIDE 100 MG/10ML IV SOLN
INTRAVENOUS | Status: DC | PRN
  Administered 2012-03-11: 50 mg via INTRAVENOUS

## 2012-03-11 MED ORDER — METOPROLOL SUCCINATE ER 100 MG PO TB24
100.0000 mg | ORAL_TABLET | Freq: Every day | ORAL | Status: DC
Start: 2012-03-11 — End: 2012-03-12
  Administered 2012-03-11 – 2012-03-12 (×2): 100 mg via ORAL
  Filled 2012-03-11 (×2): qty 1

## 2012-03-11 MED ORDER — NEOSTIGMINE METHYLSULFATE 1 MG/ML IJ SOLN
INTRAMUSCULAR | Status: DC | PRN
  Administered 2012-03-11: 4 mg via INTRAVENOUS

## 2012-03-11 MED ORDER — LABETALOL HCL 5 MG/ML IV SOLN: 10.0000 mg | INTRAVENOUS | Status: DC | PRN

## 2012-03-11 MED ORDER — LORAZEPAM 2 MG/ML IJ SOLN: 1.0000 mg | Freq: Once | INTRAMUSCULAR | Status: DC | PRN

## 2012-03-11 MED ORDER — OXYCODONE HCL 5 MG PO TABS: 5.0000 mg | ORAL_TABLET | Freq: Four times a day (QID) | ORAL | Status: AC | PRN

## 2012-03-11 MED ORDER — PROPOFOL 10 MG/ML IV EMUL
INTRAVENOUS | Status: DC | PRN
  Administered 2012-03-11: 150 mg via INTRAVENOUS

## 2012-03-11 MED ORDER — LIDOCAINE HCL (PF) 1 % IJ SOLN
INTRAMUSCULAR | Status: AC
  Filled 2012-03-11: qty 30

## 2012-03-11 MED ORDER — TEMAZEPAM 15 MG PO CAPS: 15.0000 mg | ORAL_CAPSULE | Freq: Every evening | ORAL | Status: DC | PRN

## 2012-03-11 MED ORDER — POTASSIUM CHLORIDE CRYS ER 20 MEQ PO TBCR: 20.0000 meq | EXTENDED_RELEASE_TABLET | Freq: Once | ORAL | Status: AC | PRN

## 2012-03-11 MED ORDER — PHENOL 1.4 % MT LIQD
1.0000 | OROMUCOSAL | Status: DC | PRN
  Filled 2012-03-11: qty 177

## 2012-03-11 MED ORDER — HEPARIN SODIUM (PORCINE) 1000 UNIT/ML IJ SOLN
INTRAMUSCULAR | Status: DC | PRN
  Administered 2012-03-11: 6000 [IU] via INTRAVENOUS

## 2012-03-11 MED ORDER — OLMESARTAN MEDOXOMIL-HCTZ 40-12.5 MG PO TABS: 1.0000 | ORAL_TABLET | Freq: Every morning | ORAL | Status: DC

## 2012-03-11 MED ORDER — CLOPIDOGREL BISULFATE 75 MG PO TABS
75.0000 mg | ORAL_TABLET | Freq: Every morning | ORAL | Status: DC
  Administered 2012-03-11 – 2012-03-12 (×2): 75 mg via ORAL
  Filled 2012-03-11 (×2): qty 1

## 2012-03-11 MED ORDER — NAPROXEN 250 MG PO TABS
250.0000 mg | ORAL_TABLET | Freq: Two times a day (BID) | ORAL | Status: DC | PRN
  Filled 2012-03-11: qty 1

## 2012-03-11 MED ORDER — MIDAZOLAM HCL 5 MG/5ML IJ SOLN
INTRAMUSCULAR | Status: DC | PRN
  Administered 2012-03-11: 2 mg via INTRAVENOUS

## 2012-03-11 MED ORDER — HEMOSTATIC AGENTS (NO CHARGE) OPTIME
TOPICAL | Status: DC | PRN
  Administered 2012-03-11: 1 via TOPICAL

## 2012-03-11 MED ORDER — GLYCOPYRROLATE 0.2 MG/ML IJ SOLN
INTRAMUSCULAR | Status: DC | PRN
  Administered 2012-03-11: .8 mg via INTRAVENOUS

## 2012-03-11 MED ORDER — ONDANSETRON HCL 4 MG/2ML IJ SOLN: 4.0000 mg | Freq: Four times a day (QID) | INTRAMUSCULAR | Status: DC | PRN

## 2012-03-11 MED ORDER — ATORVASTATIN CALCIUM 10 MG PO TABS
10.0000 mg | ORAL_TABLET | Freq: Every day | ORAL | Status: DC
  Administered 2012-03-11: 10 mg via ORAL
  Filled 2012-03-11 (×2): qty 1

## 2012-03-11 MED ORDER — ACETAMINOPHEN 650 MG RE SUPP: 325.0000 mg | RECTAL | Status: DC | PRN

## 2012-03-11 MED ORDER — DOCUSATE SODIUM 100 MG PO CAPS
100.0000 mg | ORAL_CAPSULE | Freq: Every day | ORAL | Status: DC
  Administered 2012-03-12: 100 mg via ORAL
  Filled 2012-03-11: qty 1

## 2012-03-11 MED ORDER — LIDOCAINE HCL (CARDIAC) 20 MG/ML IV SOLN
INTRAVENOUS | Status: DC | PRN
  Administered 2012-03-11: 100 mg via INTRAVENOUS

## 2012-03-11 MED ORDER — SODIUM CHLORIDE 0.9 % IV SOLN: 500.0000 mL | Freq: Once | INTRAVENOUS | Status: AC | PRN

## 2012-03-11 MED ORDER — SODIUM CHLORIDE 0.9 % IR SOLN
Status: DC | PRN
  Administered 2012-03-11: 08:00:00

## 2012-03-11 MED ORDER — LACTATED RINGERS IV SOLN
INTRAVENOUS | Status: DC | PRN
  Administered 2012-03-11 (×2): via INTRAVENOUS

## 2012-03-11 MED ORDER — EPHEDRINE SULFATE 50 MG/ML IJ SOLN
INTRAMUSCULAR | Status: DC | PRN
  Administered 2012-03-11: 5 mg via INTRAVENOUS

## 2012-03-11 MED ORDER — MORPHINE SULFATE 2 MG/ML IJ SOLN
2.0000 mg | INTRAMUSCULAR | Status: DC | PRN
  Administered 2012-03-11: 4 mg via INTRAVENOUS
  Filled 2012-03-11: qty 2

## 2012-03-11 MED ORDER — ONDANSETRON HCL 4 MG/2ML IJ SOLN
INTRAMUSCULAR | Status: DC | PRN
  Administered 2012-03-11: 4 mg via INTRAVENOUS

## 2012-03-11 MED ORDER — DEXTROSE 5 % IV SOLN
1.5000 g | Freq: Two times a day (BID) | INTRAVENOUS | Status: AC
  Administered 2012-03-11 – 2012-03-12 (×2): 1.5 g via INTRAVENOUS
  Filled 2012-03-11 (×2): qty 1.5

## 2012-03-11 MED ORDER — DOPAMINE-DEXTROSE 3.2-5 MG/ML-% IV SOLN: 3.0000 ug/kg/min | INTRAVENOUS | Status: DC

## 2012-03-11 MED ORDER — OXYCODONE HCL 5 MG PO TABS
5.0000 mg | ORAL_TABLET | ORAL | Status: DC | PRN
  Administered 2012-03-11: 5 mg via ORAL
  Administered 2012-03-11 – 2012-03-12 (×3): 10 mg via ORAL
  Filled 2012-03-11 (×3): qty 2
  Filled 2012-03-11: qty 1

## 2012-03-11 MED ORDER — NAPROXEN SODIUM 220 MG PO TABS: 220.0000 mg | ORAL_TABLET | ORAL | Status: DC | PRN

## 2012-03-11 MED ORDER — ACETAMINOPHEN 325 MG PO TABS
325.0000 mg | ORAL_TABLET | ORAL | Status: DC | PRN
  Administered 2012-03-11: 325 mg via ORAL
  Filled 2012-03-11: qty 1

## 2012-03-11 MED ORDER — GUAIFENESIN-DM 100-10 MG/5ML PO SYRP
15.0000 mL | ORAL_SOLUTION | ORAL | Status: DC | PRN
  Administered 2012-03-11 – 2012-03-12 (×2): 15 mL via ORAL
  Filled 2012-03-11 (×3): qty 15

## 2012-03-11 MED ORDER — IRBESARTAN 300 MG PO TABS
300.0000 mg | ORAL_TABLET | Freq: Every day | ORAL | Status: DC
  Administered 2012-03-11 – 2012-03-12 (×2): 300 mg via ORAL
  Filled 2012-03-11 (×2): qty 1

## 2012-03-11 MED ORDER — FENTANYL CITRATE 0.05 MG/ML IJ SOLN: 50.0000 ug | INTRAMUSCULAR | Status: DC | PRN

## 2012-03-11 MED ORDER — ASPIRIN EC 81 MG PO TBEC
81.0000 mg | DELAYED_RELEASE_TABLET | Freq: Every morning | ORAL | Status: DC
  Administered 2012-03-11 – 2012-03-12 (×2): 81 mg via ORAL
  Filled 2012-03-11 (×2): qty 1

## 2012-03-11 MED ORDER — PROTAMINE SULFATE 10 MG/ML IV SOLN
INTRAVENOUS | Status: DC | PRN
  Administered 2012-03-11: 50 mg via INTRAVENOUS

## 2012-03-11 MED ORDER — SODIUM CHLORIDE 0.9 % IR SOLN
Status: DC | PRN
  Administered 2012-03-11: 1000 mL

## 2012-03-11 MED ORDER — METOPROLOL TARTRATE 1 MG/ML IV SOLN: 2.0000 mg | INTRAVENOUS | Status: DC | PRN

## 2012-03-11 MED ORDER — PHENYLEPHRINE HCL 10 MG/ML IJ SOLN
10.0000 mg | INTRAVENOUS | Status: DC | PRN
  Administered 2012-03-11: 10 ug/min via INTRAVENOUS

## 2012-03-11 MED ORDER — AZELASTINE HCL 0.1 % NA SOLN
1.0000 | Freq: Two times a day (BID) | NASAL | Status: DC
  Administered 2012-03-11 – 2012-03-12 (×3): 1 via NASAL
  Filled 2012-03-11: qty 30

## 2012-03-11 MED ORDER — FENTANYL CITRATE 0.05 MG/ML IJ SOLN
INTRAMUSCULAR | Status: DC | PRN
  Administered 2012-03-11: 25 ug via INTRAVENOUS
  Administered 2012-03-11 (×2): 50 ug via INTRAVENOUS
  Administered 2012-03-11 (×2): 100 ug via INTRAVENOUS

## 2012-03-11 MED ORDER — CEFUROXIME SODIUM 1.5 G IJ SOLR
INTRAMUSCULAR | Status: AC
  Filled 2012-03-11: qty 1.5

## 2012-03-11 MED ORDER — ALUM & MAG HYDROXIDE-SIMETH 200-200-20 MG/5ML PO SUSP: 15.0000 mL | ORAL | Status: DC | PRN

## 2012-03-11 MED ORDER — SODIUM CHLORIDE 0.9 % IV SOLN: INTRAVENOUS | Status: DC

## 2012-03-11 MED ORDER — KCL IN DEXTROSE-NACL 20-5-0.45 MEQ/L-%-% IV SOLN
INTRAVENOUS | Status: DC
  Administered 2012-03-11: 19:00:00 via INTRAVENOUS
  Filled 2012-03-11 (×4): qty 1000

## 2012-03-11 MED ORDER — HYDROCHLOROTHIAZIDE 12.5 MG PO CAPS
12.5000 mg | ORAL_CAPSULE | Freq: Every day | ORAL | Status: DC
  Administered 2012-03-11 – 2012-03-12 (×2): 12.5 mg via ORAL
  Filled 2012-03-11 (×2): qty 1

## 2012-03-11 MED ORDER — DEXTROSE 5 % IV SOLN
INTRAVENOUS | Status: AC
  Filled 2012-03-11: qty 50

## 2012-03-11 MED ORDER — MIDAZOLAM HCL 2 MG/2ML IJ SOLN: 1.0000 mg | INTRAMUSCULAR | Status: DC | PRN

## 2012-03-11 MED ORDER — HYDRALAZINE HCL 20 MG/ML IJ SOLN
10.0000 mg | INTRAMUSCULAR | Status: DC | PRN
  Administered 2012-03-12: 10 mg via INTRAVENOUS
  Filled 2012-03-11: qty 1

## 2012-03-11 MED ORDER — HYDROMORPHONE HCL PF 1 MG/ML IJ SOLN: 0.2500 mg | INTRAMUSCULAR | Status: DC | PRN

## 2012-03-11 MED ORDER — PANTOPRAZOLE SODIUM 40 MG PO TBEC
40.0000 mg | DELAYED_RELEASE_TABLET | Freq: Every morning | ORAL | Status: DC
Start: 2012-03-11 — End: 2012-03-12
  Administered 2012-03-11 – 2012-03-12 (×2): 40 mg via ORAL
  Filled 2012-03-11 (×2): qty 1

## 2012-03-11 SURGICAL SUPPLY — 49 items
CANISTER SUCTION 2500CC (MISCELLANEOUS) ×2 IMPLANT
CATH ROBINSON RED A/P 18FR (CATHETERS) ×2 IMPLANT
CATH SUCT 10FR WHISTLE TIP (CATHETERS) ×2 IMPLANT
CLIP TI MEDIUM 24 (CLIP) ×2 IMPLANT
CLIP TI WIDE RED SMALL 24 (CLIP) ×2 IMPLANT
CLOTH BEACON ORANGE TIMEOUT ST (SAFETY) ×1 IMPLANT
COVER SURGICAL LIGHT HANDLE (MISCELLANEOUS) ×4 IMPLANT
CRADLE DONUT ADULT HEAD (MISCELLANEOUS) ×2 IMPLANT
DECANTER SPIKE VIAL GLASS SM (MISCELLANEOUS) IMPLANT
DRAIN HEMOVAC 1/8 X 5 (WOUND CARE) ×1 IMPLANT
DRAPE WARM FLUID 44X44 (DRAPE) ×2 IMPLANT
DRSG COVADERM 4X8 (GAUZE/BANDAGES/DRESSINGS) ×1 IMPLANT
ELECT REM PT RETURN 9FT ADLT (ELECTROSURGICAL) ×2
ELECTRODE REM PT RTRN 9FT ADLT (ELECTROSURGICAL) ×1 IMPLANT
EVACUATOR SILICONE 100CC (DRAIN) ×1 IMPLANT
GAUZE SPONGE 2X2 8PLY STRL LF (GAUZE/BANDAGES/DRESSINGS) IMPLANT
GLOVE BIO SURGEON STRL SZ 6.5 (GLOVE) ×2 IMPLANT
GLOVE BIOGEL PI IND STRL 6.5 (GLOVE) IMPLANT
GLOVE BIOGEL PI INDICATOR 6.5 (GLOVE) ×3
GLOVE ORTHOPEDIC STR SZ6.5 (GLOVE) ×1 IMPLANT
GLOVE SS BIOGEL STRL SZ 7 (GLOVE) ×1 IMPLANT
GLOVE SUPERSENSE BIOGEL SZ 7 (GLOVE) ×2
GLOVE SURG SS PI 6.0 STRL IVOR (GLOVE) ×2 IMPLANT
GOWN STRL NON-REIN LRG LVL3 (GOWN DISPOSABLE) ×7 IMPLANT
INSERT FOGARTY SM (MISCELLANEOUS) ×2 IMPLANT
KIT BASIN OR (CUSTOM PROCEDURE TRAY) ×2 IMPLANT
KIT ROOM TURNOVER OR (KITS) ×2 IMPLANT
NEEDLE 22X1 1/2 (OR ONLY) (NEEDLE) IMPLANT
NS IRRIG 1000ML POUR BTL (IV SOLUTION) ×4 IMPLANT
PACK CAROTID (CUSTOM PROCEDURE TRAY) ×2 IMPLANT
PAD ARMBOARD 7.5X6 YLW CONV (MISCELLANEOUS) ×4 IMPLANT
PATCH HEMASHIELD 8X150 (Vascular Products) ×1 IMPLANT
SHUNT CAROTID BYPASS 12FRX15.5 (VASCULAR PRODUCTS) IMPLANT
SPECIMEN JAR SMALL (MISCELLANEOUS) ×2 IMPLANT
SPONGE GAUZE 2X2 STER 10/PKG (GAUZE/BANDAGES/DRESSINGS) ×1
SUT PROLENE 6 0 BV (SUTURE) ×1 IMPLANT
SUT PROLENE 6 0 C 1 30 (SUTURE) ×1 IMPLANT
SUT PROLENE 6 0 CC (SUTURE) ×2 IMPLANT
SUT PROLENE 7 0 BV 1 (SUTURE) ×2 IMPLANT
SUT SILK 2 0 FS (SUTURE) ×2 IMPLANT
SUT VIC AB 2-0 CT1 27 (SUTURE) ×2
SUT VIC AB 2-0 CT1 TAPERPNT 27 (SUTURE) ×1 IMPLANT
SUT VIC AB 3-0 X1 27 (SUTURE) ×2 IMPLANT
SYR CONTROL 10ML LL (SYRINGE) IMPLANT
TAPE CLOTH SURG 4X10 WHT LF (GAUZE/BANDAGES/DRESSINGS) ×1 IMPLANT
TOWEL OR 17X24 6PK STRL BLUE (TOWEL DISPOSABLE) ×2 IMPLANT
TOWEL OR 17X26 10 PK STRL BLUE (TOWEL DISPOSABLE) ×2 IMPLANT
VITASURE W/FLOW CONTROL (MISCELLANEOUS) ×1 IMPLANT
WATER STERILE IRR 1000ML POUR (IV SOLUTION) ×2 IMPLANT

## 2012-03-11 NOTE — Transfer of Care (Signed)
Immediate Anesthesia Transfer of Care Note  Patient: Vincent Gilbert  Procedure(s) Performed: Procedure(s) (LRB): ENDARTERECTOMY CAROTID (Right)  Patient Location: PACU  Anesthesia Type: General  Level of Consciousness: awake, alert  and oriented  Airway & Oxygen Therapy: Patient Spontanous Breathing and Patient connected to nasal cannula oxygen  Post-op Assessment: Report given to PACU RN and Post -op Vital signs reviewed and stable  Post vital signs: Reviewed and stable  Complications: No apparent anesthesia complications

## 2012-03-11 NOTE — Op Note (Signed)
OPERATIVE REPORT  Date of Surgery: 03/11/2012  Surgeon: Josephina Gip, MD  Assistant: Lorrine Kin  Pre-op Diagnosis severe right internal carotid stenosis-asymptomatic with history of left brain TIA with moderate left internal carotid stenosis  Post-op Diagnosis: Same  Procedure: Procedure(s): Extensive right carotid endarterectomy with long Dacron patch angioplasty Anesthesia: General  200 cc  Complications: None  drains-one Jackson-Pratt Procedure Details: The patient was taken to the operating room placed in the supine position at which time satisfactory general endotracheal anesthesia was administered. The right neck was prepped with Betadine scrub and solution draped in routine sterile manner. Incision was made along the anterior border of the sternocleidomastoid muscle carried down through subcutaneous tissue and platysma using the Bovie. The common facial vein and external jugular veins were ligated with 3-0 silk ties and divided exposing the common internal and external carotid arteries. Bifurcation was relatively low in the neck. There was a long calcified plaque in the common carotid extending down to the level of the omohyoid muscle. The plaque extended distally up above the crossing of the hypoglossal nerve. A #10 shunt was prepared and the patient was heparinized. The carotid vessels were occluded with vascular clamps a longitudinal opening made in the common carotid with a 15 blade and extended with the Potts scissors to a point distal to the disease in the internal carotid. The distal vessel appeared normal. The plaque was almost totally occlusive at the carotid bifurcation and proximal internal carotid artery. The plaque extended proximally down to the omohyoid muscle. #10 shunt was inserted without difficulty reestablishing flow in about 2 minutes. A long standard endarterectomy was then performed using the elevator and Potts scissors with an eversion endarterectomy of the  external carotid. Plaque tapered off the distal internal carotid artery fairly nicely 2 7-0 Prolene tacking sutures were placed distally. Following thorough irrigation a long Dacron patch was sewn into place with 6-0 Prolene. Prior to completion of closure shunt was removed about an hour after insertion. Following antegrade and right great flushing the closure was completed with reestablishment of flow initially of the external then up the internal branch. Carotid was occluded for less than 2 minutes for removal of the shunt. Protamine was given to reverse the heparin. Patient was somewhat oozy because of his Plavix therapy for a coronary stent. Therefore this necessitated the use of a topical thrombotic agent-Vitasure. Adequate hemostasis was achieved a Jackson-Pratt drain was brought out through an inferiorly based stab wound secured with a silk suture and left in place and the wound closed in layers with Vicryl in a subcuticular fashion sterile dressing applied patient taken to the recovery room in stable condition   Josephina Gip, MD 03/11/2012 10:23 AM

## 2012-03-11 NOTE — Anesthesia Preprocedure Evaluation (Signed)
Anesthesia Evaluation  Patient identified by MRN, date of birth, ID band Patient awake    Reviewed: Allergy & Precautions, H&P , NPO status , Patient's Chart, lab work & pertinent test results  Airway Mallampati: I TM Distance: >3 FB Neck ROM: Full    Dental   Pulmonary COPDCurrent Smoker,  + rhonchi         Cardiovascular hypertension, + CAD and + Past MI + Carotid Bruit    Neuro/Psych CVA    GI/Hepatic GERD-  ,(+)     substance abuse  alcohol use,   Endo/Other    Renal/GU      Musculoskeletal   Abdominal   Peds  Hematology   Anesthesia Other Findings   Reproductive/Obstetrics                           Anesthesia Physical Anesthesia Plan  ASA: III  Anesthesia Plan: General   Post-op Pain Management:    Induction: Intravenous  Airway Management Planned: Oral ETT  Additional Equipment: Arterial line  Intra-op Plan:   Post-operative Plan: Extubation in OR  Informed Consent: I have reviewed the patients History and Physical, chart, labs and discussed the procedure including the risks, benefits and alternatives for the proposed anesthesia with the patient or authorized representative who has indicated his/her understanding and acceptance.     Plan Discussed with: CRNA and Surgeon  Anesthesia Plan Comments:         Anesthesia Quick Evaluation

## 2012-03-11 NOTE — OR Nursing (Signed)
Bilateral arm and leg strength checked preoperatively and found to be strong and equal.

## 2012-03-11 NOTE — Progress Notes (Signed)
MEDICATION RELATED CONSULT NOTE - INITIAL   Pharmacy Consult for Antibiotic Adjustment Indication: Post-op antibiotics  Allergies  Allergen Reactions  . Codeine Palpitations  Patient Measurements: Weight 53.8 kg on 6/19 Height 175 cm on 6/19 Labs:  Basename 03/11/12 0617 03/10/12 1330  WBC -- 7.1  HGB 14.3 13.6  HCT 42.0 37.8*  PLT -- 243  APTT -- 29  CREATININE -- 0.86  LABCREA -- --  CREATININE -- 0.86  CREAT24HRUR -- --  MG -- --  PHOS -- --  ALBUMIN -- 3.8  PROT -- 7.0  ALBUMIN -- 3.8  AST -- 38*  ALT -- 20  ALKPHOS -- 56  BILITOT -- 0.4  BILIDIR -- --  IBILI -- --  Microbiology: Recent Results (from the past 720 hour(s))  SURGICAL PCR SCREEN     Status: Abnormal   Collection Time   03/10/12  1:22 PM      Component Value Range Status Comment   MRSA, PCR NEGATIVE  NEGATIVE Final    Staphylococcus aureus POSITIVE (*) NEGATIVE Final    Assessment: 66 YOF s/p R-CEA on Zinacef post-op q12 x2 doses. WBC wnl. Afebrile. SCr 0.86/estCrCl~56 mL/min. No dosage adjustment needed.  Plan:  Continue Zinacef 1.5g IV q12h.  No further dose adjustment needed. Pharmacy will sign off.   Fayne Norrie 03/11/2012,12:19 PM

## 2012-03-11 NOTE — Telephone Encounter (Signed)
Spoke with pts wife to notify of 03/30/12 appt and sent letter also, dpm

## 2012-03-11 NOTE — Interval H&P Note (Signed)
History and Physical Interval Note:  03/11/2012 7:33 AM  Vincent Gilbert  has presented today for surgery, with the diagnosis of ICA STENOSIS BIL  The various methods of treatment have been discussed with the patient and family. After consideration of risks, benefits and other options for treatment, the patient has consented to  Procedure(s) (LRB): ENDARTERECTOMY CAROTID (Right) as a surgical intervention .  The patient's history has been reviewed, patient examined, no change in status, stable for surgery.  I have reviewed the patients' chart and labs.  Questions were answered to the patient's satisfaction.     Josephina Gip

## 2012-03-11 NOTE — Progress Notes (Signed)
Pt arrived from PACU, neuro intact except smile, Dr aware. Bp elevated, but came down with scheduled meds.  At end of shift, smile still asymm. Otherwise neuro intact.

## 2012-03-11 NOTE — Telephone Encounter (Signed)
Message copied by Fredrich Birks on Thu Mar 11, 2012 11:14 AM ------      Message from: Lorin Mercy K      Created: Thu Mar 11, 2012 10:16 AM      Regarding: schedule                   ----- Message -----         From: Dara Lords, PA         Sent: 03/11/2012   9:52 AM           To: Sharee Pimple, CMA            S/p right CEA by JDL today      F/u with him in 2 weeks.            Thanks,      Lelon Mast

## 2012-03-11 NOTE — Preoperative (Signed)
Beta Blockers   Reason not to administer Beta Blockers:Not Applicable 

## 2012-03-11 NOTE — H&P (View-Only) (Signed)
Subjective:     Patient ID: Vincent Gilbert, male   DOB: 1946/06/29, 66 y.o.   MRN: [id number]  HPI this 66 year old male patient was referred for carotid occlusive disease. He suffered a stroke which sounded like a left brain event with aphasia, left visual changes, and right upper extremity numbness. These symptoms all cleared within 24 hours. He did receive TPA from the stroke team. He was found to have severe bilateral carotid occlusive disease right worse than left. He was referred for further evaluation. He has no previous history of stroke or TIAs  Past Medical History  Diagnosis Date  . Hypertension   . GERD (gastroesophageal reflux disease)   . Coronary artery disease   . Stroke 09/10/2011    History  Substance Use Topics  . Smoking status: Current Everyday Smoker -- 1.5 packs/day for 40 years    Types: Cigarettes  . Smokeless tobacco: Never Used   Comment: now smokes 8-9 cigarettes/day now, trying to quit  . Alcohol Use: Yes     2-3x/ wk    Family History  Problem Relation Age of Onset  . Heart disease Mother   . Heart disease Father     Allergies  Allergen Reactions  . Codeine Other (See Comments)    Heart fluttering    Current outpatient prescriptions:aspirin EC 81 MG EC tablet, Take 1 tablet (81 mg total) by mouth daily., Disp: 30 tablet, Rfl: 3;  azelastine (ASTELIN) 137 MCG/SPRAY nasal spray, Place 1 spray into the nose Twice daily., Disp: , Rfl: ;  clopidogrel (PLAVIX) 75 MG tablet, Take 1 tablet (75 mg total) by mouth daily with breakfast., Disp: 90 tablet, Rfl: 3 metoprolol succinate (TOPROL XL) 50 MG 24 hr tablet, Take 1 tablet (50 mg total) by mouth daily. Dose increased on 01/06/2012 to a total of 150mg  per day., Disp: 90 tablet, Rfl: 1;  metoprolol succinate (TOPROL-XL) 100 MG 24 hr tablet, Take 1 tablet (100 mg total) by mouth daily., Disp: 30 tablet, Rfl: 6;  olmesartan-hydrochlorothiazide (BENICAR HCT) 40-12.5 MG per tablet, Take 1 tablet by mouth  daily., Disp: 90 tablet, Rfl: 3 pantoprazole (PROTONIX) 40 MG tablet, Take 40 mg by mouth daily.  , Disp: , Rfl: ;  rosuvastatin (CRESTOR) 40 MG tablet, Take 1 tablet (40 mg total) by mouth daily after supper., Disp: 90 tablet, Rfl: 3  BP 199/101  Pulse 67  Resp 18  Ht 5\' 9"  (1.753 m)  Wt 120 lb (54.432 kg)  BMI 17.72 kg/m2  Body mass index is 17.72 kg/(m^2).        Review of Systems denies chest pain, dyspnea on exertion, PND, orthopnea, hemoptysis, claudication. All other systems are negative and a complete review assist     Objective:   Physical Exam blood pressure 199/100 heart rate 67 respirations 18 Gen.-alert and oriented x3 in no apparent distress HEENT normal for age Lungs no rhonchi or wheezing Cardiovascular regular rhythm no murmurs carotid pulses 3+ palpable no bruits audible Abdomen soft nontender no palpable masses Musculoskeletal free of  major deformities Skin clear -no rashes Neurologic normal Lower extremities 3+ femoral and dorsalis pedis pulses palpable bilaterally with no edema  I reviewed the medical information provided as well as the carotid duplex exam and we repeated a carotid duplex exam today for comparison I reviewed and interpreted the study. My opinion the patient has a 90+ percent right internal carotid stenosis and a 70% left internal carotid stenosis.    Assessment:     History  of left brain TIA versus CVA with moderately severe left internal carotid stenosis Severe right internal carotid stenosis (greater than 90%)-asymptomatic Coronary artery disease with myocardial infarction in December 2012 with PTCA and stenting right coronary artery with drug eluting stent    Plan:     Discussed situation with Dr. Jerral Bonito who feels patient is stable from a cardiology standpoint to proceed with surgery Believe we should proceed with right carotid endarterectomy initially because of severity of lesion to be followed in the near future by left  carotid endarterectomy. We'll continue Plavix. Patient's wife is in hospital with infectious process and he will need to determine if he can have this surgery done later this week as I have recommended and he will be back with [place] to schedule

## 2012-03-11 NOTE — Discharge Summary (Signed)
[facility] Discharge Summary  Vincent Gilbert Dec 20, 1945 66 y.o. male  [id number]  Admission Date: 03/11/2012  Discharge Date: 03/12/12  Physician: Pryor Ochoa, MD  Admission Diagnosis: ICA STENOSIS BIL   HPI:   This is a 66 y.o. male patient was referred for carotid occlusive disease. He suffered a stroke which sounded like a left brain event with aphasia, left visual changes, and right upper extremity numbness. These symptoms all cleared within 24 hours. He did receive TPA from the stroke team. He was found to have severe bilateral carotid occlusive disease right worse than left. He was referred for further evaluation. He has no previous history of stroke or TIAs  Hospital Course:  The patient was admitted to the hospital and taken to the operating room on 03/11/2012 and underwent right carotid endarterectomy.  The pt tolerated the procedure well and was transported to the PACU in good condition.   By POD 1, the pt neuro status in tact.  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  His drain was removed and he was discharged home.  The remainder of the hospital course consisted of increasing mobilization and increasing intake of solids without difficulty.    Basename 03/11/12 0617 03/10/12 1330  NA 131* 129*  K 4.5 3.6  CL -- 90*  CO2 -- 31  GLUCOSE 107* 110*  BUN -- 15  CALCIUM -- 9.2    Basename 03/11/12 0617 03/10/12 1330  WBC -- 7.1  HGB 14.3 13.6  HCT 42.0 37.8*  PLT -- 243    Basename 03/10/12 1330  INR 0.94     Discharge Instructions:   The patient is discharged to home with extensive instructions on wound care and progressive ambulation.  They are instructed not to drive or perform any heavy lifting until returning to see the physician in his office.  Discharge Orders    Future Orders Please Complete By Expires   Resume previous diet      Driving Restrictions      Comments:   No driving  for 2 weeks   Lifting restrictions      Comments:   No lifting for 6 weeks   Call MD for:  temperature >100.5      Call MD for:  redness, tenderness, or signs of infection (pain, swelling, bleeding, redness, odor or green/yellow discharge around incision site)      Call MD for:  severe or increased pain, loss or decreased feeling  in affected limb(s)      Discharge wound care:      Comments:   Shower daily with soap and water starting 03/13/12   CAROTID Sugery: Call MD for difficulty swallowing or speaking; weakness in arms or legs that is a new symtom; severe headache.  If you have increased swelling in the neck and/or  are having difficulty breathing, CALL [phone]         Discharge Diagnosis:  ICA STENOSIS BIL  Secondary Diagnosis: Patient Active Problem List  Diagnosis  . Hypertension  . Tobacco abuse  . Alcohol abuse  . GERD (gastroesophageal reflux disease)  . Acute myocardial infarction of inferoposterior wall, initial episode of care  . Coronary artery disease  . Bruit  . Occlusion and stenosis of carotid artery with cerebral infarction   Past Medical History  Diagnosis Date  . Hypertension   . GERD (gastroesophageal reflux disease)   . Stroke 09/10/2011  . Coronary artery  disease   . Myocardial infarction 12/12  . Carotid artery occlusion   . Heart murmur      Vincent Gilbert, Vincent Gilbert  Home Medication Instructions AVW:[id number]   Printed on:03/11/12 8295  Medication Information                    pantoprazole (PROTONIX) 40 MG tablet Take 40 mg by mouth every morning.            azelastine (ASTELIN) 137 MCG/SPRAY nasal spray Place 1 spray into the nose Twice daily.           aspirin EC 81 MG tablet Take 81 mg by mouth every morning.            clopidogrel (PLAVIX) 75 MG tablet Take 75 mg by mouth every morning.            metoprolol succinate (TOPROL-XL) 50 MG 24 hr tablet Take 50-100 mg by mouth at bedtime. Take 100 MG in the morning and take 50 MG at night.  Take with or immediately following a meal.           olmesartan-hydrochlorothiazide (BENICAR HCT) 40-12.5 MG per tablet Take 1 tablet by mouth every morning.           rosuvastatin (CRESTOR) 40 MG tablet Take 40 mg by mouth daily after supper.           naproxen sodium (ANAPROX) 220 MG tablet Take 220 mg by mouth as needed. For pain.           metoprolol succinate (TOPROL-XL) 100 MG 24 hr tablet Take 100 mg by mouth daily. Take with or immediately following a meal.           oxyCODONE (ROXICODONE) 5 MG immediate release tablet Take 1 tablet (5 mg total) by mouth every 6 (six) hours as needed for pain. #30 NR            Disposition: home  Patient's condition: is Good  Follow up: 1. Dr.  Hart Rochester in 2 weeks.   Doreatha Massed, PA-C [facility] [phone] 03/11/2012  9:54 AM

## 2012-03-11 NOTE — Anesthesia Postprocedure Evaluation (Signed)
  Anesthesia Post-op Note  Patient: Vincent Gilbert  Procedure(s) Performed: Procedure(s) (LRB): ENDARTERECTOMY CAROTID (Right)  Patient Location: PACU  Anesthesia Type: General  Level of Consciousness: awake  Airway and Oxygen Therapy: Patient Spontanous Breathing  Post-op Pain: mild  Post-op Assessment: Post-op Vital signs reviewed, Patient's Cardiovascular Status Stable, Respiratory Function Stable, Patent Airway, No signs of Nausea or vomiting and Pain level controlled  Post-op Vital Signs: stable  Complications: No apparent anesthesia complications

## 2012-03-12 ENCOUNTER — Encounter (HOSPITAL_COMMUNITY): Payer: Self-pay | Admitting: Vascular Surgery

## 2012-03-12 LAB — BASIC METABOLIC PANEL
BUN: 8 mg/dL (ref 6–23)
Chloride: 93 mEq/L — ABNORMAL LOW (ref 96–112)
GFR calc Af Amer: >90 mL/min (ref >90)
GFR calc non Af Amer: 88 mL/min — ABNORMAL LOW (ref >90)
Glucose, Bld: 119 mg/dL — ABNORMAL HIGH (ref 70–99)
Potassium: 3.7 mEq/L (ref 3.5–5.1)
Sodium: 130 mEq/L — ABNORMAL LOW (ref 135–145)

## 2012-03-12 LAB — CBC
HCT: 29.7 % — ABNORMAL LOW (ref 39.0–52.0)
Hemoglobin: 10.6 g/dL — ABNORMAL LOW (ref 13.0–17.0)
MCHC: 35.1 g/dL (ref 30.0–36.0)
RDW: 12.6 % (ref 11.5–15.5)
WBC: 6.5 10*3/uL (ref 4.0–10.5)

## 2012-03-12 NOTE — Progress Notes (Addendum)
[facility] Progress Note  03/12/2012 7:19 AM POD 1  Subjective:  No complaints  Afebrile x 24 hrs  93%RA Filed Vitals:   03/12/12 0400  BP: 132/70  Pulse: 56  Temp: 97.8 F (36.6 C)  Resp: 10     Physical Exam: Neuro:  In tact.  Asymmetrical smile with mandibular palsy. Incision:  C/d/i without drainage.  CBC    Component Value Date/Time   WBC 6.5 03/12/2012 0409   RBC 3.08* 03/12/2012 0409   HGB 10.6* 03/12/2012 0409   HCT 29.7* 03/12/2012 0409   PLT 161 03/12/2012 0409   MCV 99.0 03/12/2012 0409   MCH 34.7* 03/12/2012 0409   MCHC 35.1 03/12/2012 0409   RDW 12.6 03/12/2012 0409   LYMPHSABS 2.1 09/10/2011 1601   MONOABS 1.5* 09/10/2011 1601   EOSABS 0.0 09/10/2011 1601   BASOSABS 0.0 09/10/2011 1601    BMET    Component Value Date/Time   NA 131* 03/11/2012 0617   K 4.5 03/11/2012 0617   CL 90* 03/10/2012 1330   CO2 31 03/10/2012 1330   GLUCOSE 107* 03/11/2012 0617   BUN 15 03/10/2012 1330   CREATININE 0.86 03/10/2012 1330   CALCIUM 9.2 03/10/2012 1330   GFRNONAA 90* 03/10/2012 1330   GFRAA >90 03/10/2012 1330     Intake/Output Summary (Last 24 hours) at 03/12/12 0719 Last data filed at 03/12/12 0100  Gross per 24 hour  Intake   1825 ml  Output    870 ml  Net    955 ml   JP Drain output:  Total of 95cc, but there is ~ 20cc in the bulb that was out since MN.   Assessment/Plan:  This is a 66 y.o. male who is s/p right CEA POD 1  -? Leave drain for a bit longer.  Pt is on plavix and has had a moderate amt of drainage out of JP.  Will d/w MD. -mandibular palsy, which was present in PACU yesterday. -continue to mobilize. -acute surgical blood loss anemia-tolerating.   Doreatha Massed, PA-C [facility] [phone]  Agree with above.  JP drained 20 cc serous dng. D/C JP and D/C to home.  Di Kindle. Edilia Bo, MD, FACS Beeper [phone] 03/12/2012

## 2012-03-12 NOTE — Care Management Note (Signed)
    Page 1 of 1   03/12/2012     4:34:52 PM   CARE MANAGEMENT NOTE 03/12/2012  Patient:  Vincent Gilbert, Vincent Gilbert   Account Number:  [id number]  Date Initiated:  03/11/2012  Documentation initiated by:  Donn Pierini  Subjective/Objective Assessment:   Pt admitted s/p carotid     Action/Plan:   PTA pt lived at home with spouse, independent   Anticipated DC Date:  03/12/2012   Anticipated DC Plan:  HOME/SELF CARE      DC Planning Services  CM consult      Choice offered to / List presented to:             Status of service:  Completed, signed off Medicare Important Message given?   (If response is "NO", the following Medicare IM given date fields will be blank) Date Medicare IM given:   Date Additional Medicare IM given:    Discharge Disposition:  HOME/SELF CARE  Per UR Regulation:  Reviewed for med. necessity/level of care/duration of stay  If discussed at Long Length of Stay Meetings, dates discussed:    Comments:  PCP- Vyas  03/12/12- 1600- Donn Pierini RN, BSN [phone] Pt discharged home with no needs post op  03/11/12- 1650- Donn Pierini RN, BSN [phone] UR completed, pt admitted from PACU, anticipate d/c home in am - return home-

## 2012-03-12 NOTE — Plan of Care (Signed)
Problem: Consults Goal: Diagnosis CEA/CES/AAA Stent Outcome: Completed/Met Date Met:  03/12/12 Carotid Endarterectomy (CEA)

## 2012-03-12 NOTE — Progress Notes (Signed)
Patient discharged home with medication and discharge instructions. 

## 2012-03-13 LAB — TYPE AND SCREEN
ABO/RH(D): A POS
Antibody Screen: NEGATIVE
Unit division: 0
Unit division: 0

## 2012-03-15 ENCOUNTER — Telehealth: Payer: Self-pay

## 2012-03-15 NOTE — Telephone Encounter (Signed)
Phone call from pt.  Reported numbness in right neck incision and extending into right jawbone.  Asking " is this is normal after carotid surgery?"  Advised pt. that this is common in area of incision, as nerves were cut during the procedure, and it will take time for improvement through healing.  Denies any difficulty swallowing or breathing.  States some swelling at right neck incision.  Advised to watch for signs of infection; ie: redness, warmth, increased swelling/inflammation, fevers/chills/ incisional drainage.  Also advised to report any problems with swallowing or difficulty breathing.  Encour. pt. to call w/ any further questions.  Verbalized understanding.

## 2012-03-16 NOTE — Procedures (Unsigned)
CAROTID DUPLEX EXAM  INDICATION:  Carotid stenosis.  HISTORY: Diabetes:  No. Cardiac:  Stent. Hypertension:  Yes. Smoking:  Yes. Previous Surgery:  No. CV History:  History of stroke in December 2012, per patient. Amaurosis Fugax No, Paresthesias No, Hemiparesis No                                      RIGHT             LEFT Brachial systolic pressure:         182               184 Brachial Doppler waveforms:         Normal            Normal Vertebral direction of flow:        Not visualized    Antegrade DUPLEX VELOCITIES (cm/sec) CCA peak systolic                   77                118 ECA peak systolic                   173               105 ICA peak systolic                   417               152 ICA end diastolic                   142               48 PLAQUE MORPHOLOGY:                  Mixed             Mixed PLAQUE AMOUNT:                      Severe            Moderate PLAQUE LOCATION:                    ICA/ECA/CCA       ICA/ECA/CCA  IMPRESSION: 1. Doppler velocities suggest an 80-99% stenosis of the right proximal     internal carotid artery and a 40-59% stenosis of the left proximal     internal carotid artery. 2. The right vertebral artery was not adequately visualized.  ___________________________________________ Quita Skye Hart [place], M.D.  CH/MEDQ  D:  03/11/2012  T:  03/11/2012  Job:  [id number]

## 2012-03-29 ENCOUNTER — Encounter: Payer: Self-pay | Admitting: Vascular Surgery

## 2012-03-30 ENCOUNTER — Encounter: Payer: Self-pay | Admitting: Vascular Surgery

## 2012-03-30 ENCOUNTER — Ambulatory Visit (INDEPENDENT_AMBULATORY_CARE_PROVIDER_SITE_OTHER): Payer: BC Managed Care – PPO | Admitting: Vascular Surgery

## 2012-03-30 VITALS — BP 169/76 | HR 75 | Temp 98.1°F | Ht 69.0 in | Wt 115.0 lb

## 2012-03-30 DIAGNOSIS — I63239 Cerebral infarction due to unspecified occlusion or stenosis of unspecified carotid arteries: Secondary | ICD-10-CM

## 2012-03-30 DIAGNOSIS — I6529 Occlusion and stenosis of unspecified carotid artery: Secondary | ICD-10-CM

## 2012-03-30 NOTE — Progress Notes (Signed)
Subjective:     Patient ID: Vincent Gilbert, male   DOB: [birth year], 66 y.o.   MRN: [id number]  HPI this 66 year old male returns 2 weeks post right carotid endarterectomy for severe right internal carotid stenosis. Following a left brain stroke which he suffered recently but recovered from very rapidly he was found to have a 90% right internal carotid stenosis and a moderate left internal carotid stenosis which was the symptomatic side. He has had a right carotid endarterectomy performed without incident. He does have some numbness anterior to the incision which is typical as well as some weakness on the right corner of his lower lip. He understands that these will improve or resolve with time. He has had no further neurologic symptoms since his surgery and has remained on his Plavix. He has also had no cardiac symptoms such as chest pain or dyspnea on exertion. Past Medical History  Diagnosis Date  . Hypertension   . GERD (gastroesophageal reflux disease)   . Stroke 09/10/2011  . Coronary artery disease   . Myocardial infarction 12/12  . Carotid artery occlusion   . Heart murmur     History  Substance Use Topics  . Smoking status: Current Everyday Smoker -- 1.5 packs/day for 40 years    Types: Cigarettes  . Smokeless tobacco: Never Used   Comment: now smokes 6-7 cigarettes/day now, trying to quit  . Alcohol Use: 25.2 oz/week    42 Cans of beer per week     2-3x/ wk    Family History  Problem Relation Age of Onset  . Heart disease Mother   . Heart disease Father     Allergies  Allergen Reactions  . Codeine Palpitations    Current outpatient prescriptions:aspirin EC 81 MG tablet, Take 81 mg by mouth every morning. , Disp: , Rfl: ;  azelastine (ASTELIN) 137 MCG/SPRAY nasal spray, Place 1 spray into the nose Twice daily., Disp: , Rfl: ;  clopidogrel (PLAVIX) 75 MG tablet, Take 75 mg by mouth every morning. , Disp: , Rfl: ;  metoprolol succinate (TOPROL-XL) 100 MG 24 hr tablet,  Take 100 mg by mouth daily. Take with or immediately following a meal., Disp: , Rfl:  metoprolol succinate (TOPROL-XL) 50 MG 24 hr tablet, Take 50-100 mg by mouth at bedtime. Take 100 MG in the morning and take 50 MG at night. Take with or immediately following a meal., Disp: , Rfl: ;  naproxen sodium (ANAPROX) 220 MG tablet, Take 220 mg by mouth as needed. For pain., Disp: , Rfl: ;  olmesartan-hydrochlorothiazide (BENICAR HCT) 40-12.5 MG per tablet, Take 1 tablet by mouth every morning., Disp: , Rfl:  pantoprazole (PROTONIX) 40 MG tablet, Take 40 mg by mouth every morning. , Disp: , Rfl: ;  rosuvastatin (CRESTOR) 40 MG tablet, Take 40 mg by mouth daily after supper., Disp: , Rfl:   BP 169/76  Pulse 75  Temp 98.1 F (36.7 C) (Oral)  Ht 5\' 9"  (1.753 m)  Wt 115 lb (52.164 kg)  BMI 16.98 kg/m2  SpO2 100%  Body mass index is 16.98 kg/(m^2).           Review of Systems     Objective:   Physical Exam blood pressure 169/76 heart rate 75 respirations 16 General well-developed well-nourished male no apparent distress alert and oriented x3 Right neck incision well healed. Decreased sensation anterior to neck incision. Mild right marginal mandibular nerve paresis. Left carotid with 3+ pulse and soft bruit. Neurologic exam normal Lungs  no rhonchi or wheezing Cardiovascular regular rhythm no murmurs Lower extremities well-perfused with 3+ femoral pulses    Assessment:     Doing well post right carotid endarterectomy for asymptomatic severe stenosis Symptomatic left internal carotid occlusive disease and needs left CE    Plan:     Plan left carotid endarterectomy on Thursday, August 1. If patient develops any new symptoms in the interim he'll be in touch with [place] immediately. He will continue Plavix throughout the perioperative course. Risks and benefits of surgery thoroughly discussed with patient and he would like to proceed

## 2012-04-21 ENCOUNTER — Other Ambulatory Visit: Payer: Self-pay | Admitting: Cardiology

## 2012-06-11 ENCOUNTER — Telehealth: Payer: Self-pay | Admitting: *Deleted

## 2012-06-11 NOTE — Telephone Encounter (Signed)
I called Vincent Gilbert to reschedule his Left CEA that had been cancelled because his sister passed away. He said his wife had just gotten out of the hospital with heart problem and he could not schedule at present.I went over the signs of stroke and to go to the Er if had any.He verbalized understanding.

## 2012-08-12 ENCOUNTER — Other Ambulatory Visit: Payer: Self-pay | Admitting: Cardiology

## 2012-09-07 ENCOUNTER — Other Ambulatory Visit: Payer: Self-pay | Admitting: Cardiology

## 2012-10-15 ENCOUNTER — Other Ambulatory Visit: Payer: Self-pay | Admitting: Cardiology

## 2012-10-25 ENCOUNTER — Other Ambulatory Visit: Payer: Self-pay | Admitting: Cardiology

## 2012-11-24 ENCOUNTER — Telehealth: Payer: Self-pay | Admitting: Cardiology

## 2012-11-24 NOTE — Telephone Encounter (Signed)
Patient wants to know if he is to continue taking Plavix 75 mg.

## 2012-11-24 NOTE — Telephone Encounter (Signed)
Patient informed that this can be discussed at his f/u appointment that is scheduled for March,20,2014 at 9:30 am.

## 2012-12-09 ENCOUNTER — Ambulatory Visit (INDEPENDENT_AMBULATORY_CARE_PROVIDER_SITE_OTHER): Payer: BC Managed Care – PPO | Admitting: Cardiology

## 2012-12-09 ENCOUNTER — Encounter: Payer: Self-pay | Admitting: Cardiology

## 2012-12-09 DIAGNOSIS — F17200 Nicotine dependence, unspecified, uncomplicated: Secondary | ICD-10-CM

## 2012-12-09 DIAGNOSIS — I10 Essential (primary) hypertension: Secondary | ICD-10-CM

## 2012-12-09 DIAGNOSIS — I2510 Atherosclerotic heart disease of native coronary artery without angina pectoris: Secondary | ICD-10-CM

## 2012-12-09 DIAGNOSIS — Z720 Tobacco use: Secondary | ICD-10-CM

## 2012-12-09 DIAGNOSIS — I6529 Occlusion and stenosis of unspecified carotid artery: Secondary | ICD-10-CM

## 2012-12-09 DIAGNOSIS — I2119 ST elevation (STEMI) myocardial infarction involving other coronary artery of inferior wall: Secondary | ICD-10-CM

## 2012-12-09 MED ORDER — AMLODIPINE BESYLATE 5 MG PO TABS: 5.0000 mg | ORAL_TABLET | Freq: Every day | ORAL | Status: DC

## 2012-12-09 NOTE — Patient Instructions (Addendum)
Your physician recommends that you schedule a follow-up appointment in: 1 month. Your physician has recommended you make the following change in your medication: Stop plavix. Start amlodipine 5 mg daily. Your new prescription has been sent to your pharmacy. All other medications will remain the same. Your physician has requested that you regularly monitor and record your blood pressure readings at home. Please check your blood pressure twice a day for 2 weeks. Please use the same machine at the same times of day to check your readings and record them. Please bring results to our office.

## 2012-12-09 NOTE — Progress Notes (Signed)
HPI The patient presents for followup of known coronary disease.  Since I last saw him he has done well.  The patient denies any new symptoms such as chest discomfort, neck or arm discomfort. There has been no new shortness of breath, PND or orthopnea. There have been no reported palpitations, presyncope or syncope. He is quite active in his yard.  With this he gets no symptoms.  He still smokes and drinks a six pack daily.   Allergies  Allergen Reactions  . Codeine Palpitations    Current Outpatient Prescriptions  Medication Sig Dispense Refill  . aspirin EC 81 MG tablet Take 81 mg by mouth every morning.       Marland Kitchen azelastine (ASTELIN) 137 MCG/SPRAY nasal spray Place 1 spray into the nose 2 (two) times daily as needed.       . clopidogrel (PLAVIX) 75 MG tablet Take 75 mg by mouth every morning.       Marland Kitchen levocetirizine (XYZAL) 5 MG tablet Take 5 mg by mouth daily.       . metoprolol succinate (TOPROL-XL) 50 MG 24 hr tablet       . olmesartan-hydrochlorothiazide (BENICAR HCT) 40-12.5 MG per tablet Take 1 tablet by mouth every morning.      . pantoprazole (PROTONIX) 40 MG tablet Take 40 mg by mouth every morning.       Marland Kitchen PROAIR HFA 108 (90 BASE) MCG/ACT inhaler Inhale 2 puffs into the lungs every 6 (six) hours as needed.       . rosuvastatin (CRESTOR) 40 MG tablet Take 40 mg by mouth daily after supper.       No current facility-administered medications for this visit.    Past Medical History  Diagnosis Date  . Hypertension   . GERD (gastroesophageal reflux disease)   . Stroke 09/10/2011  . Coronary artery disease   . Myocardial infarction 12/12  . Carotid artery occlusion   . Heart murmur     Past Surgical History  Procedure Laterality Date  . Cardiac catheterization  09-15-2011    90% mid RCA stenosis. Normal EF  . Coronary angioplasty with stent placement  09-15-2011    mid RCA: 3.5 X32 mm Promus DES  . Vasectomy    . Eye surgery  rt  . Endarterectomy  03/11/2012   Procedure: ENDARTERECTOMY CAROTID;  Surgeon: Pryor Ochoa, MD;  Location: [facility] OR;  Service: Vascular;  Laterality: Right;    ROS:  As stated in the HPI and negative for all other systems.  PHYSICAL EXAM BP 213/102  Pulse 66  Ht 5\' 9"  (1.753 m)  Wt 118 lb (53.524 kg)  BMI 17.42 kg/m2 GENERAL:  Well appearing HEENT:  Pupils equal round and reactive, fundi not visualized, oral mucosa unremarkable NECK:  No jugular venous distention, waveform within normal limits, carotid upstroke brisk and symmetric, left bruits, no thyromegaly LYMPHATICS:  No cervical, inguinal adenopathy LUNGS:  Clear to auscultation bilaterally BACK:  No CVA tenderness CHEST:  Unremarkable HEART:  PMI not displaced or sustained,S1 and S2 within normal limits, no S3, no S4, no clicks, no rubs, no murmurs ABD:  Flat, positive bowel sounds normal in frequency in pitch, midline abdominal bruits, no rebound, no guarding, no midline pulsatile mass, no hepatomegaly, no splenomegaly EXT:  2 plus pulses throughout, no edema, no cyanosis no clubbing SKIN:  No rashes no nodules NEURO:  Cranial nerves II through XII grossly intact, motor grossly intact throughout PSYCH:  Cognitively intact, oriented to person  place and time   ASSESSMENT AND PLAN  CAD:  The patient has no new sypmtoms.  No further cardiovascular testing is indicated.  We will continue with aggressive risk reduction and meds as listed.  He can stop his Plavix  HTN:  His low pressure is very elevated. I'm going to add Norvasc 5 mg daily to his regimen. I've given him instructions on how his blood pressure diary. He will do this for 2 weeks and present these data.  PVD:  He needs continued risk reduction.  HYPERLIPIDEMIA:  His target dose of Crestor.  TOBACCO ABUSE:   He understands the need to stop smoking. He wants to try on his own.

## 2012-12-30 ENCOUNTER — Other Ambulatory Visit: Payer: Self-pay | Admitting: Cardiology

## 2013-01-05 ENCOUNTER — Telehealth: Payer: Self-pay | Admitting: *Deleted

## 2013-01-05 NOTE — Telephone Encounter (Signed)
Patient informed. 

## 2013-01-05 NOTE — Telephone Encounter (Signed)
Message copied by Eustace Moore on Wed Jan 05, 2013 10:07 AM ------      Message from: Rollene Rotunda      Created: Fri Dec 31, 2012 11:28 AM       Please tell him that I reviewed the BPs and that they were much better.   ------

## 2013-01-07 ENCOUNTER — Other Ambulatory Visit (HOSPITAL_COMMUNITY): Payer: Self-pay | Admitting: Neurology

## 2013-02-09 ENCOUNTER — Ambulatory Visit (INDEPENDENT_AMBULATORY_CARE_PROVIDER_SITE_OTHER): Payer: BC Managed Care – PPO | Admitting: Cardiology

## 2013-02-09 ENCOUNTER — Encounter: Payer: Self-pay | Admitting: Cardiology

## 2013-02-09 VITALS — BP 167/85 | HR 71 | Ht 69.0 in | Wt 117.1 lb

## 2013-02-09 DIAGNOSIS — Z720 Tobacco use: Secondary | ICD-10-CM

## 2013-02-09 DIAGNOSIS — F17200 Nicotine dependence, unspecified, uncomplicated: Secondary | ICD-10-CM

## 2013-02-09 DIAGNOSIS — I2119 ST elevation (STEMI) myocardial infarction involving other coronary artery of inferior wall: Secondary | ICD-10-CM

## 2013-02-09 DIAGNOSIS — I2510 Atherosclerotic heart disease of native coronary artery without angina pectoris: Secondary | ICD-10-CM

## 2013-02-09 DIAGNOSIS — I10 Essential (primary) hypertension: Secondary | ICD-10-CM

## 2013-02-09 NOTE — Patient Instructions (Signed)
Continue all current medications. Your physician wants you to follow up in:  1 year.  You will receive a reminder letter in the mail one-two months in advance.  If you don't receive a letter, please call our office to schedule the follow up appointment   

## 2013-02-09 NOTE — Progress Notes (Signed)
HPI The patient presents for followup of known coronary disease and HTN.  Since I last saw him he has done well.  His blood pressure has been much better controlled with the addition of Norvasc. I reviewed her blood pressure diary. He tolerated this medication without lightheadedness. The patient denies any new symptoms such as chest discomfort, neck or arm discomfort. There has been no new shortness of breath, PND or orthopnea. There have been no reported palpitations, presyncope or syncope. He is quite active in his yard.  With this he gets no symptoms.  He still smokes and drinks.   Allergies  Allergen Reactions  . Codeine Palpitations    Current Outpatient Prescriptions  Medication Sig Dispense Refill  . amLODipine (NORVASC) 5 MG tablet Take 1 tablet (5 mg total) by mouth daily.  180 tablet  3  . aspirin EC 81 MG tablet Take 81 mg by mouth every morning.       . clopidogrel (PLAVIX) 75 MG tablet Take 75 mg by mouth daily.      . metoprolol succinate (TOPROL-XL) 50 MG 24 hr tablet TAKE 3 TABLETS BY MOUTH EVERY DAY  90 tablet  1  . olmesartan-hydrochlorothiazide (BENICAR HCT) 40-12.5 MG per tablet Take 1 tablet by mouth every morning.      . pantoprazole (PROTONIX) 40 MG tablet Take 40 mg by mouth every morning.       Marland Kitchen PROAIR HFA 108 (90 BASE) MCG/ACT inhaler Inhale 2 puffs into the lungs every 6 (six) hours as needed.       . rosuvastatin (CRESTOR) 40 MG tablet Take 40 mg by mouth daily after supper.       No current facility-administered medications for this visit.    Past Medical History  Diagnosis Date  . Hypertension   . GERD (gastroesophageal reflux disease)   . Stroke 09/10/2011  . Coronary artery disease   . Myocardial infarction 12/12  . Carotid artery occlusion   . Heart murmur     Past Surgical History  Procedure Laterality Date  . Cardiac catheterization  09-15-2011    90% mid RCA stenosis. Normal EF  . Coronary angioplasty with stent placement  09-15-2011   mid RCA: 3.5 X32 mm Promus DES  . Vasectomy    . Eye surgery  rt  . Endarterectomy  03/11/2012    Procedure: ENDARTERECTOMY CAROTID;  Surgeon: Pryor Ochoa, MD;  Location: [facility] OR;  Service: Vascular;  Laterality: Right;    ROS:  As stated in the HPI and negative for all other systems.  PHYSICAL EXAM BP 167/85  Pulse 71  Ht 5\' 9"  (1.753 m)  Wt 117 lb 1.9 oz (53.125 kg)  BMI 17.29 kg/m2 GENERAL:  Well appearing, thin HEENT:  Pupils equal round and reactive, fundi not visualized, oral mucosa unremarkable NECK:  No jugular venous distention, waveform within normal limits, carotid upstroke brisk and symmetric, bilateral bruits, no thyromegaly LYMPHATICS:  No cervical, inguinal adenopathy LUNGS:  Clear to auscultation bilaterally BACK:  No CVA tenderness CHEST:  Unremarkable HEART:  PMI not displaced or sustained,S1 and S2 within normal limits, no S3, no S4, no clicks, no rubs, no murmurs ABD:  Flat, positive bowel sounds normal in frequency in pitch, midline abdominal bruits, no rebound, no guarding, no midline pulsatile mass, no hepatomegaly, no splenomegaly EXT:  2 plus pulses throughout, no edema, no cyanosis no clubbing  EKG:  Sinus rhythm, rate 66, axis within normal limits, intervals within normal limits, no acute ST-T  wave changes.  02/09/2013  ASSESSMENT AND PLAN  CAD:  The patient has no new sypmtoms.  No further cardiovascular testing is indicated.  We will continue with aggressive risk reduction and meds as listed.    HTN:  His  blood pressure is much better. Although his readings today is slightly elevated. I did review a blood pressure diary and he was at target and he will continue the current medicines.  CAROTID STENOSIS:  He needs continued risk reduction.   He has been reluctant to followup on his carotids because he has numbness from his previous carotid endarterectomy. I discussed the importance of followup and he says he will contact Dr. Candie Chroman  office  HYPERLIPIDEMIA:  He is on target dose of Crestor.  TOBACCO ABUSE:   He understands the need to stop smoking. We talked about this again today.

## 2013-05-01 ENCOUNTER — Other Ambulatory Visit: Payer: Self-pay | Admitting: Cardiology

## 2013-06-01 ENCOUNTER — Other Ambulatory Visit: Payer: Self-pay | Admitting: Cardiology

## 2013-08-10 ENCOUNTER — Encounter: Payer: Self-pay | Admitting: Cardiology

## 2013-08-17 ENCOUNTER — Other Ambulatory Visit: Payer: Self-pay | Admitting: Cardiology

## 2013-09-28 ENCOUNTER — Other Ambulatory Visit: Payer: Self-pay | Admitting: Cardiology

## 2013-11-23 ENCOUNTER — Telehealth: Payer: Self-pay | Admitting: Cardiology

## 2013-11-23 NOTE — Telephone Encounter (Signed)
Pt wants to be notified when [place] sends request for medical recorders. Both numbers noted in chart are good to contact pt back at.

## 2014-01-25 ENCOUNTER — Other Ambulatory Visit: Payer: Self-pay | Admitting: *Deleted

## 2014-01-25 MED ORDER — METOPROLOL SUCCINATE ER 50 MG PO TB24: 150.0000 mg | ORAL_TABLET | Freq: Every day | ORAL | Status: DC

## 2014-02-23 ENCOUNTER — Other Ambulatory Visit: Payer: Self-pay | Admitting: *Deleted

## 2014-02-23 MED ORDER — AMLODIPINE BESYLATE 5 MG PO TABS: 5.0000 mg | ORAL_TABLET | Freq: Every day | ORAL | Status: DC

## 2014-04-11 ENCOUNTER — Encounter: Payer: Self-pay | Admitting: Cardiology

## 2014-04-11 ENCOUNTER — Ambulatory Visit (INDEPENDENT_AMBULATORY_CARE_PROVIDER_SITE_OTHER): Payer: Medicare Other | Admitting: Cardiology

## 2014-04-11 VITALS — BP 151/78 | HR 73 | Ht 69.0 in | Wt 116.0 lb

## 2014-04-11 DIAGNOSIS — I10 Essential (primary) hypertension: Secondary | ICD-10-CM

## 2014-04-11 DIAGNOSIS — I6529 Occlusion and stenosis of unspecified carotid artery: Secondary | ICD-10-CM

## 2014-04-11 DIAGNOSIS — I2510 Atherosclerotic heart disease of native coronary artery without angina pectoris: Secondary | ICD-10-CM

## 2014-04-11 NOTE — Progress Notes (Signed)
Clinical Summary Vincent Gilbert is a 68 y.o.male last seen by Dr Percival Spanish, this is our first visit together. He is seen for the following medical problems.  1. CAD - hx of interoposterior STEMI in 2012, treated with tPA in setting of acute stroke with resolution of ST segment changes..  - had cath 08/2011 as described below, received DES to RCA. LVEF by LVgram 55-60%.  - echo 08/2011 LVEF 50-55%  - denies chest pain. Has DOE with high levels of activity that is fairly stable, somewhat limited by allergies and cough - compliant with meds   2. HTN - does not check at home - compliant with meds  3. Carotid stenosis - followed by Dr Kellie Simmering, patient with prior CEA - patient reports has not followed up with vascular in sometime, they had considered doing CEA on other side but he is not sure if that is still the plan.   4. Hyperlipidemia - no recent panel in our system - [facility]  5. Tobacco abuse - <1ppd - precontemplative  Past Medical History  Diagnosis Date  . Hypertension   . GERD (gastroesophageal reflux disease)   . Stroke 09/10/2011  . Coronary artery disease   . Myocardial infarction 12/12  . Carotid artery occlusion   . Heart murmur      Allergies  Allergen Reactions  . Codeine Palpitations     Current Outpatient Prescriptions  Medication Sig Dispense Refill  . amLODipine (NORVASC) 5 MG tablet Take 1 tablet (5 mg total) by mouth daily.  180 tablet  0  . aspirin EC 81 MG tablet Take 81 mg by mouth every morning.       . clopidogrel (PLAVIX) 75 MG tablet Take 75 mg by mouth daily.      . metoprolol succinate (TOPROL-XL) 50 MG 24 hr tablet TAKE 3 TABLETS BY MOUTH EVERY DAY  90 tablet  3  . metoprolol succinate (TOPROL-XL) 50 MG 24 hr tablet Take 3 tablets (150 mg total) by mouth daily.  90 tablet  1  . olmesartan-hydrochlorothiazide (BENICAR HCT) 40-12.5 MG per tablet Take 1 tablet by mouth every morning.      . pantoprazole (PROTONIX) 40 MG  tablet Take 40 mg by mouth every morning.       Marland Kitchen PROAIR HFA 108 (90 BASE) MCG/ACT inhaler Inhale 2 puffs into the lungs every 6 (six) hours as needed.       . rosuvastatin (CRESTOR) 40 MG tablet Take 40 mg by mouth daily after supper.       No current facility-administered medications for this visit.     Past Surgical History  Procedure Laterality Date  . Cardiac catheterization  09-15-2011    90% mid RCA stenosis. Normal EF  . Coronary angioplasty with stent placement  09-15-2011    mid RCA: 3.5 X32 mm Promus DES  . Vasectomy    . Eye surgery  rt  . Endarterectomy  03/11/2012    Procedure: ENDARTERECTOMY CAROTID;  Surgeon: Mal Misty, MD;  Location: [place];  Service: Vascular;  Laterality: Right;     Allergies  Allergen Reactions  . Codeine Palpitations      Family History  Problem Relation Age of Onset  . Heart disease Mother   . Heart disease Father      Social History Vincent Gilbert reports that he has been smoking Cigarettes.  He has a 60 pack-year smoking history. He has never used smokeless tobacco. Vincent Gilbert  reports that he drinks about 25.2 ounces of alcohol per week.   Review of Systems CONSTITUTIONAL: No weight loss, fever, chills, weakness or fatigue.  HEENT: Eyes: No visual loss, blurred vision, double vision or yellow sclerae.No hearing loss, sneezing, congestion, runny nose or sore throat.  SKIN: No rash or itching.  CARDIOVASCULAR: per HPI RESPIRATORY: No shortness of breath, cough or sputum.  GASTROINTESTINAL: No anorexia, nausea, vomiting or diarrhea. No abdominal pain or blood.  GENITOURINARY: No burning on urination, no polyuria NEUROLOGICAL: No headache, dizziness, syncope, paralysis, ataxia, numbness or tingling in the extremities. No change in bowel or bladder control.  MUSCULOSKELETAL: No muscle, back pain, joint pain or stiffness.  LYMPHATICS: No enlarged nodes. No history of splenectomy.  PSYCHIATRIC: No history of depression or  anxiety.  ENDOCRINOLOGIC: No reports of sweating, cold or heat intolerance. No polyuria or polydipsia.  Marland Kitchen   Physical Examination p 73 bp 135/70 Wt 116 lbs BMI 17 Gen: resting comfortably, no acute distress HEENT: no scleral icterus, pupils equal round and reactive, no palptable cervical adenopathy,  CV: RRR, no m/r/g, no JVD Resp: Clear to auscultation bilaterally GI: abdomen is soft, non-tender, non-distended, normal bowel sounds, no hepatosplenomegaly MSK: extremities are warm, no edema.  Skin: warm, no rash Neuro:  no focal deficits Psych: appropriate affect   Diagnostic Studies 08/2011 Cath Hemodynamics:  AO 171/68  LV 178/15  Coronary angiography:  Coronary dominance: right  Left mainstem: Widely patent with no obstructive disease  Left anterior descending (LAD): The LAD is a large caliber vessel that reaches the left ventricular apex. The proximal LAD is widely patent and smooth. At the first septal perforator there is 20-30% LAD stenosis. The first diagonal branch is widely patent.  Left circumflex (LCx): The left circumflex is relatively small in distribution. It supplies an atrial branch and a small intermediate branch. There is no significant obstructive disease present  Right coronary artery (RCA): This is a large, dominant vessel. The proximal vessel is widely patent. The mid vessel after the acute marginal branch is diffusely diseased and narrowed into a tight 90% stenosis. There is a long segment of stenosis present. Distally the vessel is patent with mild nonobstructive disease. It supplies a PDA branch and 2 large posterolateral branches without significant disease.  Left ventriculography: There is hypokinesis of the basal inferior wall. The overall left ventricular ejection fraction is preserved at 55-60%. There is no significant mitral regurgitation.   PCI Data:  Vessel - RCA/Segment - mid  Percent Stenosis (pre) 90  TIMI-flow 3  Stent 3.5 x 32 mm Promus  drug-eluting  Percent Stenosis (post) 0  TIMI-flow (post) 3  Final Conclusions:  1. Severe mid RCA stenosis was successful PCI using a single drug-eluting stent  2. No significant disease in the left main, LAD, and left circumflex  3. Mild segmental LV contraction abnormality with preserved overall LV function  Recommendations: Aspirin and Plavix uninterrupted for 12 months, aggressive risk reduction measures, tobacco cessation. The patient will be eligible for hospital discharge tomorrow morning.  08/2011 Echo Study Conclusions  - Left ventricle: The cavity size was normal. Wall thickness was normal. Systolic function was normal. The estimated ejection fraction was in the range of 50% to 55%. Wall motion was normal; there were no regional wall motion abnormalities. Doppler parameters are consistent with abnormal left ventricular relaxation (grade 1 diastolic dysfunction). - Atrial septum: No defect or patent foramen ovale was identified. Impressions:  - No cardiac source of emboli was indentified.  Assessment and Plan  1. CAD - no current symptoms, continue risk factor modification and secondary prevention  2. HTN - at goal, continue current meds - he will keep bp log and bring to his next provider appointment  3. Carotid stenosis - will get him set back up with vascular  4. Hyperlipidemia - no recent labs in our system, followed by [facility] - defer management, goal LDL<70. He is on high dose statin  5. Tobacco abuse - advised to stop, he is precontemplative. Not interested in nicotine replacement therapy.   F/u 1 year   Arnoldo Lenis, M.D., F.A.C.C.

## 2014-04-11 NOTE — Patient Instructions (Signed)
   Referral to Dr. Kellie Simmering for follow up on carotid stenosis - last seen 2013 Continue all current medications. Your physician has requested that you regularly monitor and record your blood pressure readings at home. Please take reading approximately 2 hours after medication & return for MD review.  Your physician wants you to follow up in:  1 year.  You will receive a reminder letter in the mail one-two months in advance.  If you don't receive a letter, please call our office to schedule the follow up appointment

## 2014-04-17 ENCOUNTER — Other Ambulatory Visit: Payer: Self-pay | Admitting: *Deleted

## 2014-04-17 ENCOUNTER — Other Ambulatory Visit: Payer: Self-pay

## 2014-04-17 DIAGNOSIS — I6529 Occlusion and stenosis of unspecified carotid artery: Secondary | ICD-10-CM

## 2014-04-17 MED ORDER — METOPROLOL SUCCINATE ER 50 MG PO TB24: ORAL_TABLET | ORAL | Status: DC

## 2014-04-26 ENCOUNTER — Ambulatory Visit (HOSPITAL_COMMUNITY)
Admission: RE | Admit: 2014-04-26 | Discharge: 2014-04-26 | Disposition: A | Discharge status: Home / Self Care | Payer: Medicare Other | Source: Ambulatory Visit | Attending: Vascular Surgery | Admitting: Vascular Surgery

## 2014-04-26 DIAGNOSIS — I6529 Occlusion and stenosis of unspecified carotid artery: Secondary | ICD-10-CM | POA: Insufficient documentation

## 2014-04-26 DIAGNOSIS — Z48812 Encounter for surgical aftercare following surgery on the circulatory system: Secondary | ICD-10-CM | POA: Diagnosis not present

## 2014-05-01 ENCOUNTER — Encounter: Payer: Self-pay | Admitting: Vascular Surgery

## 2014-05-02 ENCOUNTER — Encounter: Payer: Self-pay | Admitting: Vascular Surgery

## 2014-05-02 ENCOUNTER — Ambulatory Visit (INDEPENDENT_AMBULATORY_CARE_PROVIDER_SITE_OTHER): Payer: Medicare Other | Admitting: Vascular Surgery

## 2014-05-02 VITALS — BP 157/75 | HR 64 | Resp 18 | Ht 69.0 in | Wt 119.0 lb

## 2014-05-02 DIAGNOSIS — Z48812 Encounter for surgical aftercare following surgery on the circulatory system: Secondary | ICD-10-CM

## 2014-05-02 DIAGNOSIS — I6529 Occlusion and stenosis of unspecified carotid artery: Secondary | ICD-10-CM

## 2014-05-02 NOTE — Progress Notes (Signed)
Subjective:     Patient ID: Vincent Gilbert, male   DOB: Nov 25, 1945, 68 y.o.   MRN: [id number]  HPI this 68 year old male returns today for continued followup regarding his carotid occlusive disease. In May of 2013 he suffered a severe left brain event consisting of aphasia and right-sided weakness. He was treated with TPA and the symptoms all resolved within 24 hours. He was found to have a tight right ICA stenosis and a moderate left ICA stenosis. Underwent right carotid endarterectomy by me in June of 2013. It was recommended that he undergo left carotid endarterectomy because of the embolic symptoms which she had experienced but he never returned for followup. Since that time he has had no neurologic events including lateralizing weakness, aphasia, amaurosis fugax, diplopia, blurred vision, or syncope. He does have a history of myocardial infarction in 2012 and has a cardiac stent. He takes aspirin daily.  Past Medical History  Diagnosis Date  . Hypertension   . GERD (gastroesophageal reflux disease)   . Stroke 09/10/2011  . Coronary artery disease   . Myocardial infarction 12/12  . Carotid artery occlusion   . Heart murmur     History  Substance Use Topics  . Smoking status: Current Every Day Smoker -- 0.75 packs/day for 40 years    Types: Cigarettes  . Smokeless tobacco: Never Used     Comment: now smokes 6-7 cigarettes/day now, trying to quit  . Alcohol Use: 25.2 oz/week    42 Cans of beer per week     Comment: 2-3x/ wk    Family History  Problem Relation Age of Onset  . Heart disease Mother   . Heart disease Father     Allergies  Allergen Reactions  . Codeine Palpitations    Current outpatient prescriptions:amLODipine (NORVASC) 5 MG tablet, Take 1 tablet (5 mg total) by mouth daily., Disp: 180 tablet, Rfl: 0;  aspirin EC 81 MG tablet, Take 81 mg by mouth every morning. , Disp: , Rfl: ;  cetirizine (ZYRTEC) 10 MG tablet, Take 10 mg by mouth daily., Disp: , Rfl: ;   metoprolol succinate (TOPROL-XL) 50 MG 24 hr tablet, Take 1 tab every morning & 2 tabs every evening, Disp: 90 tablet, Rfl: 6 Multiple Vitamin (MULTIVITAMIN) tablet, Take 1 tablet by mouth daily., Disp: , Rfl: ;  olmesartan-hydrochlorothiazide (BENICAR HCT) 40-12.5 MG per tablet, Take 1 tablet by mouth every morning., Disp: , Rfl: ;  pantoprazole (PROTONIX) 40 MG tablet, Take 40 mg by mouth every morning. , Disp: , Rfl: ;  PROAIR HFA 108 (90 BASE) MCG/ACT inhaler, Inhale 2 puffs into the lungs every 6 (six) hours as needed. , Disp: , Rfl:  rosuvastatin (CRESTOR) 40 MG tablet, Take 40 mg by mouth daily after supper., Disp: , Rfl: ;  amoxicillin (AMOXIL) 875 MG tablet, Take 875 mg by mouth daily., Disp: , Rfl:   BP 157/75  Pulse 64  Resp 18  Ht 5\' 9"  (1.753 m)  Wt 119 lb (53.978 kg)  BMI 17.57 kg/m2  Body mass index is 17.57 kg/(m^2).           Review of Systems does complain of dyspnea on exertion, productive cough, weakness in his arms and legs. Denies any other active vascular symptoms. All systems negative and a complete review of systems    Objective:   Physical Exam BP 157/75  Pulse 64  Resp 18  Ht 5\' 9"  (1.753 m)  Wt 119 lb (53.978 kg)  BMI 17.57 kg/m2  Gen.-alert and oriented x3 in no apparent distress HEENT normal for age Lungs no rhonchi or wheezing Cardiovascular regular rhythm no murmurs carotid pulses 3+ palpable no bruits audible Abdomen soft nontender no palpable masses Musculoskeletal free of  major deformities Skin clear -no rashes Neurologic normal Lower extremities 3+ femoral and dorsalis pedis pulses palpable bilaterally with no edema  Data ordered a carotid duplex exam which I reviewed and interpreted. The right internal carotid artery is widely patent at the endarterectomy site. The left internal carotid artery has moderate stenosis approximating 60-70% which is slightly irregular.        Assessment:     Status post right carotid endarterectomy for  severe stenosis following a left brain event. Stable moderate left ICA stenosis which patient did not return for recommended left carotid endarterectomy 2 years ago-has been asymptomatic since that time on aspirin therapy    Plan:     At this point with patient having no left brain symptoms for the past 2 years we'll continue to monitor this left ICA stenosis. He was counseled if he has any left brain event he should immediately contact us. Otherwise we'll see him back in one year with a followup carotid duplex exam. Continue daily aspirin.

## 2014-05-02 NOTE — Addendum Note (Signed)
Addended by: Peter Minium K on: 05/02/2014 05:00 PM   Modules accepted: Orders

## 2014-08-30 ENCOUNTER — Encounter (HOSPITAL_COMMUNITY): Payer: Self-pay | Admitting: Cardiovascular Disease

## 2014-12-28 DIAGNOSIS — H538 Other visual disturbances: Secondary | ICD-10-CM | POA: Diagnosis not present

## 2014-12-28 DIAGNOSIS — Z961 Presence of intraocular lens: Secondary | ICD-10-CM | POA: Diagnosis not present

## 2014-12-28 DIAGNOSIS — H26491 Other secondary cataract, right eye: Secondary | ICD-10-CM | POA: Diagnosis not present

## 2015-01-08 ENCOUNTER — Ambulatory Visit (HOSPITAL_COMMUNITY)
Admission: RE | Admit: 2015-01-08 | Discharge: 2015-01-08 | Disposition: A | Discharge status: Home / Self Care | Payer: Medicare Other | Source: Ambulatory Visit | Attending: Ophthalmology | Admitting: Ophthalmology

## 2015-01-08 ENCOUNTER — Encounter (HOSPITAL_COMMUNITY)
Admission: RE | Disposition: A | Discharge status: Home / Self Care | Payer: Self-pay | Source: Ambulatory Visit | Attending: Ophthalmology

## 2015-01-08 DIAGNOSIS — H26491 Other secondary cataract, right eye: Secondary | ICD-10-CM | POA: Insufficient documentation

## 2015-01-08 DIAGNOSIS — H538 Other visual disturbances: Secondary | ICD-10-CM | POA: Diagnosis not present

## 2015-01-08 HISTORY — PX: YAG LASER APPLICATION: SHX6189

## 2015-01-08 SURGERY — TREATMENT, USING YAG LASER
Anesthesia: LOCAL | Laterality: Right

## 2015-01-08 MED ORDER — TROPICAMIDE 1 % OP SOLN
OPHTHALMIC | Status: AC
  Filled 2015-01-08: qty 3

## 2015-01-08 MED ORDER — APRACLONIDINE HCL 1 % OP SOLN
OPHTHALMIC | Status: AC
  Filled 2015-01-08: qty 0.1

## 2015-01-08 MED ORDER — TETRACAINE HCL 0.5 % OP SOLN
1.0000 [drp] | Freq: Once | OPHTHALMIC | Status: AC
  Administered 2015-01-08: 1 [drp] via OPHTHALMIC

## 2015-01-08 MED ORDER — TROPICAMIDE 1 % OP SOLN
1.0000 [drp] | OPHTHALMIC | Status: AC
  Administered 2015-01-08 (×3): 1 [drp] via OPHTHALMIC

## 2015-01-08 MED ORDER — APRACLONIDINE HCL 1 % OP SOLN
1.0000 [drp] | OPHTHALMIC | Status: AC
  Administered 2015-01-08: 10:00:00 via OPHTHALMIC
  Administered 2015-01-08 (×2): 1 [drp] via OPHTHALMIC

## 2015-01-08 MED ORDER — TETRACAINE HCL 0.5 % OP SOLN
OPHTHALMIC | Status: AC
  Filled 2015-01-08: qty 2

## 2015-01-08 NOTE — Brief Op Note (Signed)
BORA BRONER 01/08/2015  Williams Che, MD  Pre-op Diagnosis:  secondary cataract right eye  Post-op Diagnosis: * No post-op diagnosis entered *  Yag laser self-test completed: Yes.    Indications:  See scqanned office H&P  Procedure: YAG capsulotomy   right eye  Eye protection worn by staff:  Yes.   Laser In Use sign on door:  Yes.    Laser:  [employer] duet  Power Setting:  1.7 mJ/burst Anatomical site treated:  Posterior capsule OD Number of applications:  19 Total energy delivered: 31.47 mJ Results:  Open visual axis   Patient was instructed to go to the office, as previously scheduled, for intraocular pressure:  No.  Patient verbalizes understanding of discharge instructions:  Yes.    Notes:  Gomoscopy: Pt tolerated procedure well without complications

## 2015-01-08 NOTE — H&P (Signed)
I have reviewed the pre printed H&P, the patient was re-examined, and I have identified no significant interval changes in the patient's medical condition.  There is no change in the plan of care since the history and physical of record. 

## 2015-01-08 NOTE — Discharge Instructions (Signed)
Vincent Gilbert  01/08/2015     Instructions    Activity: No Restrictions.   Diet: Resume Diet you were on at home.   Pain Medication: Tylenol if Needed.   CONTACT YOUR DOCTOR IF YOU HAVE PAIN, REDNESS IN YOUR EYE, OR DECREASED VISION.   Follow-up:in the next few days with Williams Che, MD.   Dr. Gershon Crane: [phone]  Dr. Iona Hansen: [phone]  Dr. Geoffry Paradise: [phone]   If you find that you cannot contact your physician, but feel that your signs and   Symptoms warrant a physician's attention, call the Emergency Room at   [phone].   Othern/a.

## 2015-01-10 ENCOUNTER — Encounter (HOSPITAL_COMMUNITY): Payer: Self-pay | Admitting: Ophthalmology

## 2015-04-20 DIAGNOSIS — Z418 Encounter for other procedures for purposes other than remedying health state: Secondary | ICD-10-CM | POA: Diagnosis not present

## 2015-04-20 DIAGNOSIS — I10 Essential (primary) hypertension: Secondary | ICD-10-CM | POA: Diagnosis not present

## 2015-04-20 DIAGNOSIS — J449 Chronic obstructive pulmonary disease, unspecified: Secondary | ICD-10-CM | POA: Diagnosis not present

## 2015-04-20 DIAGNOSIS — F17200 Nicotine dependence, unspecified, uncomplicated: Secondary | ICD-10-CM | POA: Diagnosis not present

## 2015-05-04 ENCOUNTER — Ambulatory Visit (INDEPENDENT_AMBULATORY_CARE_PROVIDER_SITE_OTHER): Payer: Medicare Other | Admitting: Cardiology

## 2015-05-04 ENCOUNTER — Encounter: Payer: Self-pay | Admitting: *Deleted

## 2015-05-04 ENCOUNTER — Encounter: Payer: Self-pay | Admitting: Cardiology

## 2015-05-04 VITALS — BP 140/70 | HR 69 | Ht 69.0 in | Wt 118.0 lb

## 2015-05-04 DIAGNOSIS — I2510 Atherosclerotic heart disease of native coronary artery without angina pectoris: Secondary | ICD-10-CM | POA: Diagnosis not present

## 2015-05-04 DIAGNOSIS — I10 Essential (primary) hypertension: Secondary | ICD-10-CM | POA: Diagnosis not present

## 2015-05-04 DIAGNOSIS — I6529 Occlusion and stenosis of unspecified carotid artery: Secondary | ICD-10-CM | POA: Diagnosis not present

## 2015-05-04 MED ORDER — LISINOPRIL 40 MG PO TABS: 40.0000 mg | ORAL_TABLET | Freq: Every day | ORAL | Status: DC

## 2015-05-04 NOTE — Patient Instructions (Signed)
Your physician wants you to follow-up in: 1 year with Dr. Bryna Colander will receive a reminder letter in the mail two months in advance. If you don't receive a letter, please call our office to schedule the follow-up appointment.  Your physician has recommended you make the following change in your medication:   STOP BENICAR  START LISINOPRIL 40 MG DAILY  WE WILL REQUEST LABS FROM [place]  Thank you for choosing [facility]!!

## 2015-05-04 NOTE — Progress Notes (Signed)
Patient ID: JERRELL MANGEL, male   DOB: 07/19/1946, 69 y.o.   MRN: [id number]     Clinical Summary Mr. Cowger is a 69 y.o.male seen today for follow up of the following medical problems.   1. CAD - hx of interoposterior STEMI in 2012, treated with tPA in setting of acute stroke with resolution of ST segment changes..  - had cath 08/2011 as described below, received DES to RCA. LVEF by LVgram 55-60%.  - echo 08/2011 LVEF 50-55%  - denies any chest pain. Denies any SOB or DOE   2. HTN - does not check at home - compliant with meds  3. Carotid stenosis - followed by Dr Kellie Simmering, patient with prior CEA  4. Hyperlipidemia - no recent panel in our system - [facility]  5. COPD - [facility] Past Medical History  Diagnosis Date  . Hypertension   . GERD (gastroesophageal reflux disease)   . Stroke 09/10/2011  . Coronary artery disease   . Myocardial infarction 12/12  . Carotid artery occlusion   . Heart murmur      Allergies  Allergen Reactions  . Codeine Palpitations     Current Outpatient Prescriptions  Medication Sig Dispense Refill  . amLODipine (NORVASC) 5 MG tablet Take 1 tablet (5 mg total) by mouth daily. 180 tablet 0  . aspirin EC 81 MG tablet Take 81 mg by mouth every morning.     . budesonide-formoterol (SYMBICORT) 80-4.5 MCG/ACT inhaler Inhale 2 puffs into the lungs 2 (two) times daily.    . cetirizine (ZYRTEC) 10 MG tablet Take 10 mg by mouth daily.    . fluticasone (FLONASE) 50 MCG/ACT nasal spray Place 2 sprays into both nostrils daily.  4  . MAGNESIUM PO Take 1 tablet by mouth daily.    . metoprolol succinate (TOPROL-XL) 50 MG 24 hr tablet Take 1 tab every morning & 2 tabs every evening (Patient taking differently: Take 50-100 mg by mouth 2 (two) times daily. Take 2 tablets every morning & 1 tablet every evening) 90 tablet 6  . Multiple Vitamin (MULTIVITAMIN) tablet Take 1 tablet by mouth daily.    Marland Kitchen olmesartan (BENICAR) 40 MG tablet  Take 40 mg by mouth daily.    Marland Kitchen oral electrolytes (THERMOTABS) TABS tablet Take 1 tablet by mouth daily.    . pantoprazole (PROTONIX) 40 MG tablet Take 40 mg by mouth every morning.     . rosuvastatin (CRESTOR) 40 MG tablet Take 40 mg by mouth daily after supper.    . tiotropium (SPIRIVA) 18 MCG inhalation capsule Place 18 mcg into inhaler and inhale daily.     No current facility-administered medications for this visit.     Past Surgical History  Procedure Laterality Date  . Cardiac catheterization  09-15-2011    90% mid RCA stenosis. Normal EF  . Coronary angioplasty with stent placement  09-15-2011    mid RCA: 3.5 X32 mm Promus DES  . Vasectomy    . Eye surgery  rt  . Endarterectomy  03/11/2012    Procedure: ENDARTERECTOMY CAROTID;  Surgeon: Mal Misty, MD;  Location: [place];  Service: Vascular;  Laterality: Right;  . Left heart catheterization with coronary angiogram N/A 09/15/2011    Procedure: LEFT HEART CATHETERIZATION WITH CORONARY ANGIOGRAM;  Surgeon: Sherren Mocha, MD;  Location: [facility] CATH LAB;  Service: Cardiovascular;  Laterality: N/A;  . Percutaneous coronary stent intervention (pci-s) N/A 09/15/2011    Procedure: PERCUTANEOUS CORONARY STENT INTERVENTION (PCI-S);  Surgeon: Sherren Mocha, MD;  Location[facility] CATH LAB;  Service: Cardiovascular;  Laterality: N/A;  . Yag laser application Right 8/41/6606    Procedure: YAG LASER APPLICATION;  Surgeon: Williams Che, MD;  Location: [facility];  Service: Ophthalmology;  Laterality: Right;     Allergies  Allergen Reactions  . Codeine Palpitations      Family History  Problem Relation Age of Onset  . Heart disease Mother   . Heart disease Father      Social History Mr. Wingerter reports that he has been smoking Cigarettes.  He has a 30 pack-year smoking history. He has never used smokeless tobacco. Mr. Wint reports that he drinks about 25.2 oz of alcohol per week.   Review of Systems CONSTITUTIONAL: No weight  loss, fever, chills, weakness or fatigue.  HEENT: Eyes: No visual loss, blurred vision, double vision or yellow sclerae.No hearing loss, sneezing, congestion, runny nose or sore throat.  SKIN: No rash or itching.  CARDIOVASCULAR: per HPI RESPIRATORY: No shortness of breath, cough or sputum.  GASTROINTESTINAL: No anorexia, nausea, vomiting or diarrhea. No abdominal pain or blood.  GENITOURINARY: No burning on urination, no polyuria NEUROLOGICAL: No headache, dizziness, syncope, paralysis, ataxia, numbness or tingling in the extremities. No change in bowel or bladder control.  MUSCULOSKELETAL: No muscle, back pain, joint pain or stiffness.  LYMPHATICS: No enlarged nodes. No history of splenectomy.  PSYCHIATRIC: No history of depression or anxiety.  ENDOCRINOLOGIC: No reports of sweating, cold or heat intolerance. No polyuria or polydipsia.  Marland Kitchen   Physical Examination Filed Vitals:   05/04/15 1135  BP: 140/70  Pulse: 69   Filed Vitals:   05/04/15 1135  Height: '5\' 9"'$  (1.753 m)  Weight: 118 lb (53.524 kg)    Gen: resting comfortably, no acute distress HEENT: no scleral icterus, pupils equal round and reactive, no palptable cervical adenopathy,  CV: RRR, no m/r/g, no jvd. Left carotid bruit Resp: Clear to auscultation bilaterally GI: abdomen is soft, non-tender, non-distended, normal bowel sounds, no hepatosplenomegaly MSK: extremities are warm, no edema.  Skin: warm, no rash Neuro:  no focal deficits Psych: appropriate affect   Diagnostic Studies 08/2011 Cath Hemodynamics:  AO 171/68  LV 178/15  Coronary angiography:  Coronary dominance: right  Left mainstem: Widely patent with no obstructive disease  Left anterior descending (LAD): The LAD is a large caliber vessel that reaches the left ventricular apex. The proximal LAD is widely patent and smooth. At the first septal perforator there is 20-30% LAD stenosis. The first diagonal branch is widely patent.  Left circumflex  (LCx): The left circumflex is relatively small in distribution. It supplies an atrial branch and a small intermediate branch. There is no significant obstructive disease present  Right coronary artery (RCA): This is a large, dominant vessel. The proximal vessel is widely patent. The mid vessel after the acute marginal branch is diffusely diseased and narrowed into a tight 90% stenosis. There is a long segment of stenosis present. Distally the vessel is patent with mild nonobstructive disease. It supplies a PDA branch and 2 large posterolateral branches without significant disease.  Left ventriculography: There is hypokinesis of the basal inferior wall. The overall left ventricular ejection fraction is preserved at 55-60%. There is no significant mitral regurgitation.   PCI Data:  Vessel - RCA/Segment - mid  Percent Stenosis (pre) 90  TIMI-flow 3  Stent 3.5 x 32 mm Promus drug-eluting  Percent Stenosis (post) 0  TIMI-flow (post) 3  Final Conclusions:  1.  Severe mid RCA stenosis was successful PCI using a single drug-eluting stent  2. No significant disease in the left main, LAD, and left circumflex  3. Mild segmental LV contraction abnormality with preserved overall LV function  Recommendations: Aspirin and Plavix uninterrupted for 12 months, aggressive risk reduction measures, tobacco cessation. The patient will be eligible for hospital discharge tomorrow morning.  08/2011 Echo Study Conclusions  - Left ventricle: The cavity size was normal. Wall thickness was normal. Systolic function was normal. The estimated ejection fraction was in the range of 50% to 55%. Wall motion was normal; there were no regional wall motion abnormalities. Doppler parameters are consistent with abnormal left ventricular relaxation (grade 1 diastolic dysfunction). - Atrial septum: No defect or patent foramen ovale was identified. Impressions:  - No cardiac source of emboli was  indentified.    Assessment and Plan  1. CAD - no current symptoms, continue risk factor modification and secondary prevention  2. HTN - at goal, continue current meds  3. Carotid stenosis - continue to follow w/ vascular  4. Hyperlipidemia - request labs from [place] - continue high dose statin    F/u 1 year   Arnoldo Lenis, M.D.

## 2015-05-07 ENCOUNTER — Encounter: Payer: Self-pay | Admitting: Vascular Surgery

## 2015-05-08 ENCOUNTER — Encounter (HOSPITAL_COMMUNITY): Payer: Medicare Other

## 2015-05-08 ENCOUNTER — Ambulatory Visit: Payer: Medicare Other | Admitting: Vascular Surgery

## 2015-06-27 DIAGNOSIS — Z23 Encounter for immunization: Secondary | ICD-10-CM | POA: Diagnosis not present

## 2015-07-05 ENCOUNTER — Encounter: Payer: Self-pay | Admitting: Vascular Surgery

## 2015-07-10 ENCOUNTER — Encounter: Payer: Self-pay | Admitting: Vascular Surgery

## 2015-07-10 ENCOUNTER — Ambulatory Visit (HOSPITAL_COMMUNITY)
Admission: RE | Admit: 2015-07-10 | Discharge: 2015-07-10 | Disposition: A | Discharge status: Home / Self Care | Payer: Medicare Other | Source: Ambulatory Visit | Attending: Vascular Surgery | Admitting: Vascular Surgery

## 2015-07-10 ENCOUNTER — Ambulatory Visit (INDEPENDENT_AMBULATORY_CARE_PROVIDER_SITE_OTHER): Payer: Medicare Other | Admitting: Vascular Surgery

## 2015-07-10 VITALS — BP 136/78 | HR 70 | Temp 98.0°F | Resp 18 | Ht 69.0 in | Wt 113.0 lb

## 2015-07-10 DIAGNOSIS — Z48812 Encounter for surgical aftercare following surgery on the circulatory system: Secondary | ICD-10-CM | POA: Diagnosis not present

## 2015-07-10 DIAGNOSIS — I6522 Occlusion and stenosis of left carotid artery: Secondary | ICD-10-CM

## 2015-07-10 DIAGNOSIS — I6521 Occlusion and stenosis of right carotid artery: Secondary | ICD-10-CM | POA: Diagnosis not present

## 2015-07-10 DIAGNOSIS — I2510 Atherosclerotic heart disease of native coronary artery without angina pectoris: Secondary | ICD-10-CM | POA: Insufficient documentation

## 2015-07-10 DIAGNOSIS — I6523 Occlusion and stenosis of bilateral carotid arteries: Secondary | ICD-10-CM | POA: Diagnosis not present

## 2015-07-10 DIAGNOSIS — I10 Essential (primary) hypertension: Secondary | ICD-10-CM | POA: Diagnosis not present

## 2015-07-10 NOTE — Progress Notes (Signed)
Subjective:     Patient ID: Vincent Gilbert, male   DOB: Jan 13, 1946, 69 y.o.   MRN: [id number]  HPI This 69 year old male suffered a severe left brain event in May 2013. He was treated with TPA and this resolved within 24 hours. He was found to have a severe right ICA stenosis and a moderate left ICA stenosis. He initially underwent right carotid endarterectomy by me and did well. He never returned for his left carotid endarterectomy which had been recommended. He has been treated with aspirin since that time with no neurologic symptoms. He denies lateralizing weakness, aphasia, amaurosis fugax, diplopia, blurred vision, or syncope. He has had laser surgery on his right eye.   Past Medical History  Diagnosis Date  . Hypertension   . GERD (gastroesophageal reflux disease)   . Stroke ([place]) 09/10/2011  . Coronary artery disease   . Myocardial infarction ([place]) 12/12  . Carotid artery occlusion   . Heart murmur     Social History  Substance Use Topics  . Smoking status: Current Every Day Smoker -- 0.75 packs/day for 40 years    Types: Cigarettes  . Smokeless tobacco: Never Used     Comment: now smokes 6-7 cigarettes/day now, trying to quit  . Alcohol Use: 25.2 oz/week    42 Cans of beer per week     Comment: 2-3x/ wk    Family History  Problem Relation Age of Onset  . Heart disease Mother   . Heart disease Father     Allergies  Allergen Reactions  . Oxycodone     nausea  . Codeine Palpitations     Current outpatient prescriptions:  .  amLODipine (NORVASC) 10 MG tablet, Take 10 mg by mouth daily., Disp: , Rfl:  .  aspirin EC 81 MG tablet, Take 81 mg by mouth every morning. , Disp: , Rfl:  .  budesonide-formoterol (SYMBICORT) 80-4.5 MCG/ACT inhaler, Inhale 2 puffs into the lungs 2 (two) times daily., Disp: , Rfl:  .  cetirizine (ZYRTEC) 10 MG tablet, Take 10 mg by mouth daily., Disp: , Rfl:  .  fluticasone (FLONASE) 50 MCG/ACT nasal spray, Place 2 sprays into both nostrils  daily., Disp: , Rfl: 4 .  MAGNESIUM PO, Take 1 tablet by mouth daily., Disp: , Rfl:  .  metoprolol succinate (TOPROL-XL) 100 MG 24 hr tablet, Take 100 mg by mouth daily. Take with or immediately following a meal., Disp: , Rfl:  .  Multiple Vitamin (MULTIVITAMIN) tablet, Take 1 tablet by mouth daily., Disp: , Rfl:  .  oral electrolytes (THERMOTABS) TABS tablet, Take 1 tablet by mouth daily., Disp: , Rfl:  .  pantoprazole (PROTONIX) 40 MG tablet, Take 40 mg by mouth every morning. , Disp: , Rfl:  .  rosuvastatin (CRESTOR) 40 MG tablet, Take 40 mg by mouth daily after supper., Disp: , Rfl:  .  tiotropium (SPIRIVA) 18 MCG inhalation capsule, Place 18 mcg into inhaler and inhale daily., Disp: , Rfl:  .  lisinopril (PRINIVIL,ZESTRIL) 40 MG tablet, Take 1 tablet (40 mg total) by mouth daily. (Patient not taking: Reported on 07/10/2015), Disp: 90 tablet, Rfl: 3  Filed Vitals:   07/10/15 1316 07/10/15 1319  BP: 132/75 136/78  Pulse: 70 70  Temp: 98 F (36.7 C)   Resp: 18   Height: '5\' 9"'$  (1.753 m)   Weight: 113 lb (51.256 kg)   SpO2: 94%     Body mass index is 16.68 kg/(m^2).  Review of Systems Patient continues to smoke one hlf pack cigarettes per day. Denies chest pain but does have dyspnea on exertion from COPD. Denies lower extremity claudication symptoms. All other systems negative and complete review of systems     Objective:   Physical Exam BP 136/78 mmHg  Pulse 70  Temp(Src) 98 F (36.7 C)  Resp 18  Ht '5\' 9"'$  (1.753 m)  Wt 113 lb (51.256 kg)  BMI 16.68 kg/m2  SpO2 94%  Gen.-alert and oriented x3 in no apparent distress HEENT normal for age Lungs no rhonchi or wheezing Cardiovascular regular rhythm no murmurs carotid pulses 3+ palpable no bruits audible Abdomen soft nontender no palpable masses Musculoskeletal free of  major deformities Skin clear -no rashes Neurologic normal Lower extremities 3+ femoral and dorsalis pedis pulses palpable bilaterally with  no edema   today I reviewed the carotid duplex exam which I interpreted. Also compared this to the previous carotid study done in August 2015. Left ICA stenosis appears to be 60-70% and has progressed slightly since last year's exam. Right carotid endarterectomy site continues to be widely patent.      Assessment:      status post right carotid endarterectomy for severe stenosis-asymptomatic  status post 3 years ago left [place] event which cleared with TPA -patient has moderate left ICA stenosis which has not been treated surgically  ongoing tobacco abuse Coronary artery disease  GERD Hypertension     Plan:      patient will continue daily aspirin and return in 1 year for repeat carotid duplex exam If he develops any neurologic symptoms he will be in touch with [place] for earlier follow-up  Once again he was encouraged to discontinue smoking

## 2015-07-16 NOTE — Addendum Note (Signed)
Addended by: Dorthula Rue L on: 07/16/2015 11:30 AM   Modules accepted: Orders

## 2015-10-30 ENCOUNTER — Encounter: Payer: Self-pay | Admitting: *Deleted

## 2015-10-30 ENCOUNTER — Encounter: Payer: Self-pay | Admitting: Cardiology

## 2015-10-30 ENCOUNTER — Ambulatory Visit (INDEPENDENT_AMBULATORY_CARE_PROVIDER_SITE_OTHER): Payer: Medicare Other | Admitting: Cardiology

## 2015-10-30 VITALS — BP 150/86 | HR 69 | Ht 69.0 in | Wt 119.0 lb

## 2015-10-30 DIAGNOSIS — R079 Chest pain, unspecified: Secondary | ICD-10-CM | POA: Diagnosis not present

## 2015-10-30 DIAGNOSIS — I10 Essential (primary) hypertension: Secondary | ICD-10-CM | POA: Diagnosis not present

## 2015-10-30 DIAGNOSIS — I2510 Atherosclerotic heart disease of native coronary artery without angina pectoris: Secondary | ICD-10-CM | POA: Diagnosis not present

## 2015-10-30 DIAGNOSIS — E785 Hyperlipidemia, unspecified: Secondary | ICD-10-CM

## 2015-10-30 DIAGNOSIS — I6529 Occlusion and stenosis of unspecified carotid artery: Secondary | ICD-10-CM

## 2015-10-30 NOTE — Progress Notes (Signed)
Patient ID: Vincent Gilbert, male   DOB: 03/20/1946, 70 y.o.   MRN: [id number]     Clinical Summary Vincent Gilbert is a 70 y.o.male seen today for follow up of the following medical problems.   1. CAD - hx of interoposterior STEMI in 2012, treated with tPA in setting of acute stroke with resolution of ST segment changes..  - had cath 08/2011 as described below, received DES to RCA. LVEF by LVgram 55-60%.  - echo 08/2011 LVEF 50-55%  - notes some occasional chest pain. Pressure midchest 2/10, mild SOB. Tends to occur with activity. Not positional. No relation to food. Lasts up to 30-45 min. Occurs once a week. Ongoing for over a year, stable frequency and severity.  - DOE with walking to mailbox which is stable.  - compliant with meds   2. HTN - does not check at home - compliant with meds  3. Carotid stenosis - followed by Dr Kellie Simmering, patient with prior CEA  4. Hyperlipidemia - 12/2014: TC 135 HDL 74 TG 150 LDL 31 - compliant with statin  5. [facility]    Past Medical History  Diagnosis Date  . Hypertension   . GERD (gastroesophageal reflux disease)   . Stroke ([place]) 09/10/2011  . Coronary artery disease   . Myocardial infarction ([place]) 12/12  . Carotid artery occlusion   . Heart murmur      Allergies  Allergen Reactions  . Oxycodone     nausea  . Codeine Palpitations     Current Outpatient Prescriptions  Medication Sig Dispense Refill  . amLODipine (NORVASC) 10 MG tablet Take 10 mg by mouth daily.    Marland Kitchen aspirin EC 81 MG tablet Take 81 mg by mouth every morning.     . budesonide-formoterol (SYMBICORT) 80-4.5 MCG/ACT inhaler Inhale 2 puffs into the lungs 2 (two) times daily.    . cetirizine (ZYRTEC) 10 MG tablet Take 10 mg by mouth daily.    . fluticasone (FLONASE) 50 MCG/ACT nasal spray Place 2 sprays into both nostrils daily.  4  . lisinopril (PRINIVIL,ZESTRIL) 40 MG tablet Take 1 tablet (40 mg total) by mouth daily. (Patient not taking: Reported  on 07/10/2015) 90 tablet 3  . MAGNESIUM PO Take 1 tablet by mouth daily.    . metoprolol succinate (TOPROL-XL) 100 MG 24 hr tablet Take 100 mg by mouth daily. Take with or immediately following a meal.    . Multiple Vitamin (MULTIVITAMIN) tablet Take 1 tablet by mouth daily.    Marland Kitchen oral electrolytes (THERMOTABS) TABS tablet Take 1 tablet by mouth daily.    . pantoprazole (PROTONIX) 40 MG tablet Take 40 mg by mouth every morning.     . rosuvastatin (CRESTOR) 40 MG tablet Take 40 mg by mouth daily after supper.    . tiotropium (SPIRIVA) 18 MCG inhalation capsule Place 18 mcg into inhaler and inhale daily.     No current facility-administered medications for this visit.     Past Surgical History  Procedure Laterality Date  . Cardiac catheterization  09-15-2011    90% mid RCA stenosis. Normal EF  . Coronary angioplasty with stent placement  09-15-2011    mid RCA: 3.5 X32 mm Promus DES  . Vasectomy    . Eye surgery  rt  . Endarterectomy  03/11/2012    Procedure: ENDARTERECTOMY CAROTID;  Surgeon: Mal Misty, MD;  Location: [facility];  Service: Vascular;  Laterality: Right;  . Left heart catheterization with coronary angiogram N/A  09/15/2011    Procedure: LEFT HEART CATHETERIZATION WITH CORONARY ANGIOGRAM;  Surgeon: Sherren Mocha, MD;  Location: [facility] CATH LAB;  Service: Cardiovascular;  Laterality: N/A;  . Percutaneous coronary stent intervention (pci-s) N/A 09/15/2011    Procedure: PERCUTANEOUS CORONARY STENT INTERVENTION (PCI-S);  Surgeon: Sherren Mocha, MD;  Location: [facility] CATH LAB;  Service: Cardiovascular;  Laterality: N/A;  . Yag laser application Right 11/03/9415    Procedure: YAG LASER APPLICATION;  Surgeon: Williams Che, MD;  [facility];  Service: Ophthalmology;  Laterality: Right;     Allergies  Allergen Reactions  . Oxycodone     nausea  . Codeine Palpitations      Family History  Problem Relation Age of Onset  . Heart disease Mother   . Heart disease Father       Social History Mr. Waterhouse reports that he has been smoking Cigarettes.  He has a 30 pack-year smoking history. He has never used smokeless tobacco. Mr. Beaubrun reports that he drinks about 25.2 oz of alcohol per week.   Review of Systems CONSTITUTIONAL: No weight loss, fever, chills, weakness or fatigue.  HEENT: Eyes: No visual loss, blurred vision, double vision or yellow sclerae.No hearing loss, sneezing, congestion, runny nose or sore throat.  SKIN: No rash or itching.  CARDIOVASCULAR: per hpi RESPIRATORY: No shortness of breath, cough or sputum.  GASTROINTESTINAL: No anorexia, nausea, vomiting or diarrhea. No abdominal pain or blood.  GENITOURINARY: No burning on urination, no polyuria NEUROLOGICAL: No headache, dizziness, syncope, paralysis, ataxia, numbness or tingling in the extremities. No change in bowel or bladder control.  MUSCULOSKELETAL: No muscle, back pain, joint pain or stiffness.  LYMPHATICS: No enlarged nodes. No history of splenectomy.  PSYCHIATRIC: No history of depression or anxiety.  ENDOCRINOLOGIC: No reports of sweating, cold or heat intolerance. No polyuria or polydipsia.  Marland Kitchen   Physical Examination Filed Vitals:   10/30/15 0847  BP: 150/86  Pulse: 69   Filed Vitals:   10/30/15 0847  Height: '5\' 9"'$  (1.753 m)  Weight: 119 lb (53.978 kg)   Manual bp 140/90  Gen: resting comfortably, no acute distress HEENT: no scleral icterus, pupils equal round and reactive, no palptable cervical adenopathy,  CV: RRR, no m/r/g, no jvd Resp: Clear to auscultation bilaterally GI: abdomen is soft, non-tender, non-distended, normal bowel sounds, no hepatosplenomegaly MSK: extremities are warm, no edema.  Skin: warm, no rash Neuro:  no focal deficits Psych: appropriate affect   Diagnostic Studies 08/2011 Cath  Hemodynamics:  AO 171/68  LV 178/15  Coronary angiography:  Coronary dominance: right  Left mainstem: Widely patent with no obstructive disease   Left anterior descending (LAD): The LAD is a large caliber vessel that reaches the left ventricular apex. The proximal LAD is widely patent and smooth. At the first septal perforator there is 20-30% LAD stenosis. The first diagonal Josedejesus Marcum is widely patent.  Left circumflex (LCx): The left circumflex is relatively small in distribution. It supplies an atrial Jaymond Waage and a small intermediate Horald Birky. There is no significant obstructive disease present  Right coronary artery (RCA): This is a large, dominant vessel. The proximal vessel is widely patent. The mid vessel after the acute marginal Glenwood Revoir is diffusely diseased and narrowed into a tight 90% stenosis. There is a long segment of stenosis present. Distally the vessel is patent with mild nonobstructive disease. It supplies a PDA Lorris Carducci and 2 large posterolateral branches without significant disease.  Left ventriculography: There is hypokinesis of the basal inferior wall. The  overall left ventricular ejection fraction is preserved at 55-60%. There is no significant mitral regurgitation.  PCI Data:  Vessel - RCA/Segment - mid  Percent Stenosis (pre) 90  TIMI-flow 3  Stent 3.5 x 32 mm Promus drug-eluting  Percent Stenosis (post) 0  TIMI-flow (post) 3  Final Conclusions:  1. Severe mid RCA stenosis was successful PCI using a single drug-eluting stent  2. No significant disease in the left main, LAD, and left circumflex  3. Mild segmental LV contraction abnormality with preserved overall LV function  Recommendations: Aspirin and Plavix uninterrupted for 12 months, aggressive risk reduction measures, tobacco cessation. The patient will be eligible for hospital discharge tomorrow morning.  08/2011 Echo  Study Conclusions  - Left ventricle: The cavity size was normal. Wall thickness was normal. Systolic function was normal. The estimated ejection fraction was in the range of 50% to 55%. Wall motion was normal; there were no regional wall  motion abnormalities. Doppler parameters are consistent with abnormal left ventricular relaxation (grade 1 diastolic dysfunction). - Atrial septum: No defect or patent foramen ovale was identified. Impressions:  - No cardiac source of emboli was indentified.     10/30/15 Clinic EKG: SR, no ischemic changes   Assessment and Plan  1. CAD - recent chest pain symptoms, ekg in clinic without acute ischemic changes  will obtain exercise nuclear stress test. Hold beta blocker and norvasc on day of test  2. HTN - at goal by manual bp, contnue current meds  3. Carotid stenosis - continue to follow w/ vascular  4. Hyperlipidemia - at goal - continue high dose statin   F/u 3 months   Arnoldo Lenis, M.D.

## 2015-10-30 NOTE — Patient Instructions (Signed)
Your physician recommends that you schedule a follow-up appointment in: 3 MONTHS WITH DR. [place]  Your physician recommends that you continue on your current medications as directed. Please refer to the Current Medication list given to you today.  Your physician has requested that you have en exercise stress myoview. For further information please visit [email or web address]. Please follow instruction sheet, as given.  PLEASE HOLD TOPROL THE DAY OF YOUR TEST  Thank you for choosing [facility]!!

## 2015-11-14 ENCOUNTER — Encounter (HOSPITAL_COMMUNITY): Payer: Medicare Other

## 2015-11-14 ENCOUNTER — Ambulatory Visit (HOSPITAL_COMMUNITY): Payer: Medicare Other

## 2015-11-23 ENCOUNTER — Encounter (HOSPITAL_COMMUNITY)
Admission: RE | Admit: 2015-11-23 | Discharge: 2015-11-23 | Disposition: A | Discharge status: Home / Self Care | Payer: Medicare Other | Source: Ambulatory Visit | Attending: Cardiology | Admitting: Cardiology

## 2015-11-23 ENCOUNTER — Inpatient Hospital Stay (HOSPITAL_COMMUNITY): Admission: RE | Admit: 2015-11-23 | Payer: Medicare Other | Source: Ambulatory Visit

## 2015-11-23 ENCOUNTER — Encounter (HOSPITAL_COMMUNITY): Payer: Self-pay

## 2015-11-23 DIAGNOSIS — I519 Heart disease, unspecified: Secondary | ICD-10-CM | POA: Insufficient documentation

## 2015-11-23 DIAGNOSIS — R079 Chest pain, unspecified: Secondary | ICD-10-CM | POA: Insufficient documentation

## 2015-11-23 LAB — NM MYOCAR MULTI W/SPECT W/WALL MOTION / EF
CHL CUP NUCLEAR SDS: 4
CHL CUP NUCLEAR SSS: 12
CSEPHR: 75 %
CSEPPHR: 115 {beats}/min
Estimated workload: 7 METS
Exercise duration (min): 5 min
Exercise duration (sec): 20 s
LV sys vol: 31 mL
LVDIAVOL: 72 mL
MPHR: 153 {beats}/min
RATE: 0.04
RPE: 18
Rest HR: 69 {beats}/min
SRS: 8
TID: 0.95

## 2015-11-23 MED ORDER — TECHNETIUM TC 99M SESTAMIBI - CARDIOLITE
30.0000 | Freq: Once | INTRAVENOUS | Status: AC | PRN
  Administered 2015-11-23: 30 via INTRAVENOUS

## 2015-11-23 MED ORDER — SODIUM CHLORIDE 0.9% FLUSH
INTRAVENOUS | Status: AC
  Administered 2015-11-23: 10 mL via INTRAVENOUS
  Filled 2015-11-23: qty 10

## 2015-11-23 MED ORDER — TECHNETIUM TC 99M SESTAMIBI GENERIC - CARDIOLITE
10.0000 | Freq: Once | INTRAVENOUS | Status: AC | PRN
  Administered 2015-11-23: 10 via INTRAVENOUS

## 2015-11-23 MED ORDER — REGADENOSON 0.4 MG/5ML IV SOLN
INTRAVENOUS | Status: AC
  Administered 2015-11-23: 0.4 mg via INTRAVENOUS
  Filled 2015-11-23: qty 5

## 2015-11-27 ENCOUNTER — Telehealth: Payer: Self-pay | Admitting: *Deleted

## 2015-11-27 NOTE — Telephone Encounter (Signed)
-----   Message from Arnoldo Lenis, MD sent at 11/26/2015  4:17 PM EST ----- Stress test overall looks ok, no strong evidence for significant blockages. He needs to f/u with me within the next 3-4 weeks  Zandra Abts MD

## 2015-11-27 NOTE — Telephone Encounter (Signed)
Pt aware, routed to pcp, scheduled appt 12/19/15

## 2015-12-19 ENCOUNTER — Ambulatory Visit (INDEPENDENT_AMBULATORY_CARE_PROVIDER_SITE_OTHER): Payer: Medicare Other | Admitting: Cardiology

## 2015-12-19 ENCOUNTER — Encounter: Payer: Self-pay | Admitting: Cardiology

## 2015-12-19 VITALS — BP 152/82 | HR 68 | Ht 69.0 in | Wt 118.0 lb

## 2015-12-19 DIAGNOSIS — E785 Hyperlipidemia, unspecified: Secondary | ICD-10-CM | POA: Diagnosis not present

## 2015-12-19 DIAGNOSIS — R079 Chest pain, unspecified: Secondary | ICD-10-CM

## 2015-12-19 DIAGNOSIS — I2510 Atherosclerotic heart disease of native coronary artery without angina pectoris: Secondary | ICD-10-CM

## 2015-12-19 DIAGNOSIS — I6529 Occlusion and stenosis of unspecified carotid artery: Secondary | ICD-10-CM

## 2015-12-19 DIAGNOSIS — I10 Essential (primary) hypertension: Secondary | ICD-10-CM

## 2015-12-19 NOTE — Patient Instructions (Signed)
Your physician wants you to follow-up in: [place] DR. BRANCH You will receive a reminder letter in the mail two months in advance. If you don't receive a letter, please call our office to schedule the follow-up appointment.  Your physician recommends that you continue on your current medications as directed. Please refer to the Current Medication list given to you today.  PLEASE CALL us TO VERIFY METOPROLOL DOSE  Thank you for choosing [facility]!!

## 2015-12-19 NOTE — Progress Notes (Signed)
Patient ID: PAO HAFFEY, male   DOB: 1945/10/09, 70 y.o.   MRN: [id number]     Clinical Summary Mr. Jewkes is a 70 y.o.male seen today for follow up of the following medical problems.    1. CAD - hx of interoposterior STEMI in 2012, treated with tPA in setting of acute stroke with resolution of ST segment changes..  - had cath 08/2011 as described below, received DES to RCA. LVEF by LVgram 55-60%.  - echo 08/2011 LVEF 50-55% - last visit he reported some episodes of chest pain.   - completed Lexiscan nuclear stress 11/2015, partially reversible inferolateral wall defect though some possible artifact by adjacent gut radiotracer uptake. Cannot exlcuded small region of ischemia.  - breathing mainly affected by sinus congestion. Symptoms unchanged since last visit. STill with mild 2/10 chest pressure with activity.   2. HTN - compliant with meds  3. Carotid stenosis - followed by Dr Kellie Simmering, patient with prior CEA  4. Hyperlipidemia - 12/2014: TC 135 HDL 74 TG 150 LDL 31 - he takes his crestor daily  5. [facility] - [facility] Past Medical History  Diagnosis Date  . Hypertension   . GERD (gastroesophageal reflux disease)   . Stroke ([place]) 09/10/2011  . Coronary artery disease   . Myocardial infarction ([place]) 12/12  . Carotid artery occlusion   . Heart murmur      Allergies  Allergen Reactions  . Oxycodone     nausea  . Codeine Palpitations     Current Outpatient Prescriptions  Medication Sig Dispense Refill  . albuterol (PROVENTIL HFA;VENTOLIN HFA) 108 (90 Base) MCG/ACT inhaler Inhale 2 puffs into the lungs every 6 (six) hours as needed for wheezing or shortness of breath.    Marland Kitchen amLODipine (NORVASC) 10 MG tablet Take 10 mg by mouth daily.    Marland Kitchen aspirin EC 81 MG tablet Take 81 mg by mouth every morning.     . budesonide-formoterol (SYMBICORT) 80-4.5 MCG/ACT inhaler Inhale 2 puffs into the lungs 2 (two) times daily.    . cetirizine (ZYRTEC) 10 MG tablet Take 10 mg by  mouth daily.    . fluticasone (FLONASE) 50 MCG/ACT nasal spray Place 2 sprays into both nostrils daily.  4  . lisinopril (PRINIVIL,ZESTRIL) 40 MG tablet Take 1 tablet (40 mg total) by mouth daily. 90 tablet 3  . metoprolol succinate (TOPROL-XL) 100 MG 24 hr tablet Take 100 mg by mouth daily. Take with or immediately following a meal.    . Multiple Vitamin (MULTIVITAMIN) tablet Take 1 tablet by mouth daily.    Marland Kitchen oral electrolytes (THERMOTABS) TABS tablet Take 1 tablet by mouth daily.    . pantoprazole (PROTONIX) 40 MG tablet Take 40 mg by mouth every morning.     . Potassium 99 MG TABS Take 1 tablet by mouth daily.    . rosuvastatin (CRESTOR) 40 MG tablet Take 40 mg by mouth daily after supper.    . tiotropium (SPIRIVA) 18 MCG inhalation capsule Place 18 mcg into inhaler and inhale daily.     No current facility-administered medications for this visit.     Past Surgical History  Procedure Laterality Date  . Cardiac catheterization  09-15-2011    90% mid RCA stenosis. Normal EF  . Coronary angioplasty with stent placement  09-15-2011    mid RCA: 3.5 X32 mm Promus DES  . Vasectomy    . Eye surgery  rt  . Endarterectomy  03/11/2012    Procedure: ENDARTERECTOMY CAROTID;  Surgeon: Mal Misty, MD;  Location: [facility];  Service: Vascular;  Laterality: Right;  . Left heart catheterization with coronary angiogram N/A 09/15/2011    Procedure: LEFT HEART CATHETERIZATION WITH CORONARY ANGIOGRAM;  Surgeon: Sherren Mocha, MD;  Location: [facility] CATH LAB;  Service: Cardiovascular;  Laterality: N/A;  . Percutaneous coronary stent intervention (pci-s) N/A 09/15/2011    Procedure: PERCUTANEOUS CORONARY STENT INTERVENTION (PCI-S);  Surgeon: Sherren Mocha, MD;  Location: [facility] CATH LAB;  Service: Cardiovascular;  Laterality: N/A;  . Yag laser application Right 0/04/6760    Procedure: YAG LASER APPLICATION;  Surgeon: Williams Che, MD;  [facility];  Service: Ophthalmology;  Laterality: Right;      Allergies  Allergen Reactions  . Oxycodone     nausea  . Codeine Palpitations      Family History  Problem Relation Age of Onset  . Heart disease Mother   . Heart disease Father      Social History Mr. Chamberlin reports that he has been smoking Cigarettes.  He started smoking about 59 years ago. He has a 30 pack-year smoking history. He has never used smokeless tobacco. Mr. Stukey reports that he drinks about 25.2 oz of alcohol per week.   Review of Systems CONSTITUTIONAL: No weight loss, fever, chills, weakness or fatigue.  HEENT: Eyes: No visual loss, blurred vision, double vision or yellow sclerae.No hearing loss, sneezing, congestion, runny nose or sore throat.  SKIN: No rash or itching.  CARDIOVASCULAR: per HPI RESPIRATORY: No shortness of breath, cough or sputum.  GASTROINTESTINAL: No anorexia, nausea, vomiting or diarrhea. No abdominal pain or blood.  GENITOURINARY: No burning on urination, no polyuria NEUROLOGICAL: No headache, dizziness, syncope, paralysis, ataxia, numbness or tingling in the extremities. No change in bowel or bladder control.  MUSCULOSKELETAL: No muscle, back pain, joint pain or stiffness.  LYMPHATICS: No enlarged nodes. No history of splenectomy.  PSYCHIATRIC: No history of depression or anxiety.  ENDOCRINOLOGIC: No reports of sweating, cold or heat intolerance. No polyuria or polydipsia.  Marland Kitchen   Physical Examination Filed Vitals:   12/19/15 1038  BP: 152/82  Pulse: 68   Filed Vitals:   12/19/15 1038  Height: '5\' 9"'$  (1.753 m)  Weight: 118 lb (53.524 kg)    Gen: resting comfortably, no acute distress HEENT: no scleral icterus, pupils equal round and reactive, no palptable cervical adenopathy,  CV: RRR, no m/r/g, no jvd Resp: Clear to auscultation bilaterally GI: abdomen is soft, non-tender, non-distended, normal bowel sounds, no hepatosplenomegaly MSK: extremities are warm, no edema.  Skin: warm, no rash Neuro:  no focal  deficits Psych: appropriate affect   Diagnostic Studies 08/2011 Cath  Hemodynamics:  AO 171/68  LV 178/15  Coronary angiography:  Coronary dominance: right  Left mainstem: Widely patent with no obstructive disease  Left anterior descending (LAD): The LAD is a large caliber vessel that reaches the left ventricular apex. The proximal LAD is widely patent and smooth. At the first septal perforator there is 20-30% LAD stenosis. The first diagonal Braeleigh Pyper is widely patent.  Left circumflex (LCx): The left circumflex is relatively small in distribution. It supplies an atrial Tymar Polyak and a small intermediate Rosamaria Donn. There is no significant obstructive disease present  Right coronary artery (RCA): This is a large, dominant vessel. The proximal vessel is widely patent. The mid vessel after the acute marginal Creed Kail is diffusely diseased and narrowed into a tight 90% stenosis. There is a long segment of stenosis present. Distally the vessel is patent with  mild nonobstructive disease. It supplies a PDA Omaria Plunk and 2 large posterolateral branches without significant disease.  Left ventriculography: There is hypokinesis of the basal inferior wall. The overall left ventricular ejection fraction is preserved at 55-60%. There is no significant mitral regurgitation.  PCI Data:  Vessel - RCA/Segment - mid  Percent Stenosis (pre) 90  TIMI-flow 3  Stent 3.5 x 32 mm Promus drug-eluting  Percent Stenosis (post) 0  TIMI-flow (post) 3  Final Conclusions:  1. Severe mid RCA stenosis was successful PCI using a single drug-eluting stent  2. No significant disease in the left main, LAD, and left circumflex  3. Mild segmental LV contraction abnormality with preserved overall LV function  Recommendations: Aspirin and Plavix uninterrupted for 12 months, aggressive risk reduction measures, tobacco cessation. The patient will be eligible for hospital discharge tomorrow morning.  08/2011 Echo  Study  Conclusions  - Left ventricle: The cavity size was normal. Wall thickness was normal. Systolic function was normal. The estimated ejection fraction was in the range of 50% to 55%. Wall motion was normal; there were no regional wall motion abnormalities. Doppler parameters are consistent with abnormal left ventricular relaxation (grade 1 diastolic dysfunction). - Atrial septum: No defect or patent foramen ovale was identified. Impressions:  - No cardiac source of emboli was indentified.     10/30/15 Clinic EKG: SR, no ischemic changes    Assessment and Plan  1. CAD - mild stable exertional symptoms, stress test overall low risk. Continue medical therapy. We will increase his beta blocker for additional antiangial effects. He is unclear what dose he is currently taking, he will call and notify us so we may make the appropriate increase  2. HTN - elevated, we will increase his beta blocker  3. Carotid stenosis - continue to follow w/ vascular  4. Hyperlipidemia - at goal - we will continue high dose statin    F/u 6 months   Arnoldo Lenis, M.D.

## 2015-12-20 ENCOUNTER — Telehealth: Payer: Self-pay | Admitting: *Deleted

## 2015-12-20 MED ORDER — METOPROLOL TARTRATE 100 MG PO TABS: 100.0000 mg | ORAL_TABLET | Freq: Two times a day (BID) | ORAL | Status: DC

## 2015-12-20 NOTE — Telephone Encounter (Signed)
-----   Message from Arnoldo Lenis, MD sent at 12/20/2015 11:15 AM EDT ----- Regarding: RE: FYI - metoprolol  Have him change it to 2 tables in the morning and 2 tablets at night, that should be '100mg'$  bid  Zandra Abts MD ----- Message -----    From: Massie Maroon, CMA    Sent: 12/19/2015  11:48 AM      To: Arnoldo Lenis, MD Subject: FYI - metoprolol                               Pt says he is taking metoprolol tartrate 50 mg 2 in the morning and 1 in the evening.   Kaleth Koy

## 2015-12-20 NOTE — Telephone Encounter (Signed)
Pt voiced understanding of med change. Will sent rx to [facility] per pt and pt says next time he is at [place] he will get rx correct with them.

## 2016-02-07 ENCOUNTER — Ambulatory Visit: Payer: Medicare Other | Admitting: Cardiology

## 2016-06-02 DIAGNOSIS — Z23 Encounter for immunization: Secondary | ICD-10-CM | POA: Diagnosis not present

## 2016-07-02 DIAGNOSIS — Z23 Encounter for immunization: Secondary | ICD-10-CM | POA: Diagnosis not present

## 2016-07-11 ENCOUNTER — Encounter: Payer: Self-pay | Admitting: Family

## 2016-07-15 ENCOUNTER — Ambulatory Visit (HOSPITAL_COMMUNITY)
Admission: RE | Admit: 2016-07-15 | Discharge: 2016-07-15 | Disposition: A | Discharge status: Home / Self Care | Payer: Medicare Other | Source: Ambulatory Visit | Attending: Vascular Surgery | Admitting: Vascular Surgery

## 2016-07-15 ENCOUNTER — Ambulatory Visit (INDEPENDENT_AMBULATORY_CARE_PROVIDER_SITE_OTHER): Payer: Medicare Other | Admitting: Family

## 2016-07-15 ENCOUNTER — Encounter: Payer: Self-pay | Admitting: Family

## 2016-07-15 VITALS — BP 130/70 | HR 70 | Temp 99.0°F | Resp 16 | Ht 69.0 in | Wt 110.0 lb

## 2016-07-15 DIAGNOSIS — I6523 Occlusion and stenosis of bilateral carotid arteries: Secondary | ICD-10-CM | POA: Diagnosis not present

## 2016-07-15 DIAGNOSIS — F17200 Nicotine dependence, unspecified, uncomplicated: Secondary | ICD-10-CM | POA: Diagnosis not present

## 2016-07-15 DIAGNOSIS — Z9889 Other specified postprocedural states: Secondary | ICD-10-CM | POA: Diagnosis not present

## 2016-07-15 DIAGNOSIS — I6522 Occlusion and stenosis of left carotid artery: Secondary | ICD-10-CM

## 2016-07-15 NOTE — Progress Notes (Signed)
Chief Complaint: Follow up Extracranial Carotid Artery Stenosis   History of Present Illness  Vincent Gilbert is a 69 y.o. male patient of Dr. Kellie Simmering who suffered a severe left brain event in May 2013. He was treated with TPA and this resolved within 24 hours. He was found to have a severe right ICA stenosis and a moderate left ICA stenosis. He initially underwent right carotid endarterectomy by Dr. Kellie Simmering and did well. He never returned for his left carotid endarterectomy which had been recommended. He has been treated with aspirin since that time with no neurologic symptoms. He denies lateralizing weakness, aphasia, amaurosis fugax, diplopia, blurred vision, or syncope. He has had laser surgery on his right eye.   He has COPD, takes   Pt Diabetic: no Pt smoker: smoker  (1/2 ppd, he quit 2-3 x for about a week, started smoking at age 27 yrs)  Pt meds include: Statin : yes ASA: yes Other anticoagulants/antiplatelets: no   Past Medical History:  Diagnosis Date  . Carotid artery occlusion   . Coronary artery disease   . GERD (gastroesophageal reflux disease)   . Heart murmur   . Hypertension   . Myocardial infarction 12/12  . Stroke [facility]) 09/10/2011    Social History Social History  Substance Use Topics  . Smoking status: Current Every Day Smoker    Packs/day: 0.75    Years: 40.00    Types: Cigarettes    Start date: 02/26/1956  . Smokeless tobacco: Never Used     Comment: 1/2 pk per day  . Alcohol use 25.2 oz/week    42 Cans of beer per week     Comment: 2-3x/ wk    Family History Family History  Problem Relation Age of Onset  . Heart disease Mother   . Heart disease Father     Surgical History Past Surgical History:  Procedure Laterality Date  . CARDIAC CATHETERIZATION  09-15-2011   90% mid RCA stenosis. Normal EF  . CORONARY ANGIOPLASTY WITH STENT PLACEMENT  09-15-2011   mid RCA: 3.5 X32 mm Promus DES  . ENDARTERECTOMY  03/11/2012   Procedure:  ENDARTERECTOMY CAROTID;  Surgeon: Mal Misty, MD;  Location: [place];  Service: Vascular;  Laterality: Right;  . EYE SURGERY  rt  . LEFT HEART CATHETERIZATION WITH CORONARY ANGIOGRAM N/A 09/15/2011   Procedure: LEFT HEART CATHETERIZATION WITH CORONARY ANGIOGRAM;  Surgeon: Sherren Mocha, MD;  Location: [facility] CATH LAB;  Service: Cardiovascular;  Laterality: N/A;  . PERCUTANEOUS CORONARY STENT INTERVENTION (PCI-S) N/A 09/15/2011   Procedure: PERCUTANEOUS CORONARY STENT INTERVENTION (PCI-S);  Surgeon: Sherren Mocha, MD;  Location: [facility] CATH LAB;  Service: Cardiovascular;  Laterality: N/A;  . VASECTOMY    . YAG LASER APPLICATION Right 3/32/9518   Procedure: YAG LASER APPLICATION;  Surgeon: Williams Che, MD;  Location: [facility];  Service: Ophthalmology;  Laterality: Right;    Allergies  Allergen Reactions  . Oxycodone     nausea  . Codeine Palpitations    Current Outpatient Prescriptions  Medication Sig Dispense Refill  . albuterol (PROVENTIL HFA;VENTOLIN HFA) 108 (90 Base) MCG/ACT inhaler Inhale 2 puffs into the lungs every 6 (six) hours as needed for wheezing or shortness of breath.    Marland Kitchen amLODipine (NORVASC) 10 MG tablet Take 10 mg by mouth daily.    Marland Kitchen aspirin EC 81 MG tablet Take 81 mg by mouth every morning.     . budesonide-formoterol (SYMBICORT) 80-4.5 MCG/ACT inhaler Inhale 2 puffs into the lungs 2 (  two) times daily.    . cetirizine (ZYRTEC) 10 MG tablet Take 10 mg by mouth daily.    . fluticasone (FLONASE) 50 MCG/ACT nasal spray Place 2 sprays into both nostrils daily.  4  . lisinopril (PRINIVIL,ZESTRIL) 40 MG tablet Take 1 tablet (40 mg total) by mouth daily. 90 tablet 3  . metoprolol (LOPRESSOR) 100 MG tablet Take 1 tablet (100 mg total) by mouth 2 (two) times daily. 60 tablet 3  . Multiple Vitamin (MULTIVITAMIN) tablet Take 1 tablet by mouth daily.    Marland Kitchen oral electrolytes (THERMOTABS) TABS tablet Take 1 tablet by mouth daily.    . pantoprazole (PROTONIX) 40 MG tablet Take 40 mg by  mouth every morning.     . Potassium 99 MG TABS Take 1 tablet by mouth daily.    . rosuvastatin (CRESTOR) 40 MG tablet Take 40 mg by mouth daily after supper.    . tiotropium (SPIRIVA) 18 MCG inhalation capsule Place 18 mcg into inhaler and inhale daily.     No current facility-administered medications for this visit.     Review of Systems : See HPI for pertinent positives and negatives.  Physical Examination  Vitals:   07/15/16 1302 07/15/16 1304  BP: 134/68 130/70  Pulse: 70   Resp: 16   Temp: 99 F (37.2 C)   TempSrc: Oral   SpO2: 92%   Weight: 110 lb (49.9 kg)   Height: '5\' 9"'$  (1.753 m)    Body mass index is 16.24 kg/m.  General: WDWN thin male in NAD GAIT: normal Eyes: PERRLA Pulmonary:  Respirations are non-labored, limited air movement, CTAB.  Cardiac: regular rhythm, no detected murmur.  VASCULAR EXAM Carotid Bruits Right Left   Negative Negative    Aorta is moderately palpable. Radial pulses are 2+ palpable and equal.                                                                                                                            LE Pulses Right Left       FEMORAL  3+ palpable  not palpable        POPLITEAL  not palpable   not palpable       POSTERIOR TIBIAL  not palpable   not palpable        DORSALIS PEDIS      ANTERIOR TIBIAL 2+ palpable  not palpable     Gastrointestinal: soft, nontender, BS WNL, no r/g, scaphoid,  no palpable masses.  Musculoskeletal: No muscle atrophy/wasting. M/S 5/5 throughout, extremities without ischemic changes.  Neurologic: A&O X 3; Appropriate Affect, Speech is normal CN 2-12 intact, pain and light touch intact in extremities, Motor exam as listed above.    Assessment: Vincent Gilbert is a 70 y.o. male who is s/p  right carotid endarterectomy in 2013. He suffered a severe left brain event in May 2013. He was treated with TPA and this resolved within 24 hours. He was found to  have a severe right ICA  stenosis and a moderate left ICA stenosis. He has not had any subsequent strokes of TIA's.   DATA Today's carotid duplex suggests 40-59% bilateral ICA stenosis.  Bilateral vertebral arteries are antegrade. Bilateral subclavian arteries are multiphasic. Disease progression of the right ICA compared to the exam on 07/10/15; the left ICA remains stable.    Plan:  The patient was counseled re smoking cessation and given several free resources re smoking cessation.   Follow-up in 1 year with Carotid Duplex scan.   I discussed in depth with the patient the nature of atherosclerosis, and emphasized the importance of maximal medical management including strict control of blood pressure, blood glucose, and lipid levels, obtaining regular exercise, and cessation of smoking.  The patient is aware that without maximal medical management the underlying atherosclerotic disease process will progress, limiting the benefit of any interventions. The patient was given information about stroke prevention and what symptoms should prompt the patient to seek immediate medical care. Thank you for allowing [place] to participate in this patient's care.  Clemon Chambers, RN, MSN, FNP-C [facility] Office: [phone]  Clinic Physician: Early  07/15/16 1:07 PM

## 2016-07-15 NOTE — Patient Instructions (Addendum)
Stroke Prevention Some medical conditions and behaviors are associated with an increased chance of having a stroke. You may prevent a stroke by making healthy choices and managing medical conditions. HOW CAN I REDUCE MY RISK OF HAVING A STROKE?   Stay physically active. Get at least 30 minutes of activity on most or all days.  Do not smoke. It may also be helpful to avoid exposure to secondhand smoke.  Limit alcohol use. Moderate alcohol use is considered to be:  No more than 2 drinks per day for men.  No more than 1 drink per day for nonpregnant women.  Eat healthy foods. This involves:  Eating 5 or more servings of fruits and vegetables a day.  Making dietary changes that address high blood pressure (hypertension), high cholesterol, diabetes, or obesity.  Manage your cholesterol levels.  Making food choices that are high in fiber and low in saturated fat, trans fat, and cholesterol may control cholesterol levels.  Take any prescribed medicines to control cholesterol as directed by your health care provider.  Manage your diabetes.  Controlling your carbohydrate and sugar intake is recommended to manage diabetes.  Take any prescribed medicines to control diabetes as directed by your health care provider.  Control your hypertension.  Making food choices that are low in salt (sodium), saturated fat, trans fat, and cholesterol is recommended to manage hypertension.  Ask your health care provider if you need treatment to lower your blood pressure. Take any prescribed medicines to control hypertension as directed by your health care provider.  If you are 18-39 years of age, have your blood pressure checked every 3-5 years. If you are 40 years of age or older, have your blood pressure checked every year.  Maintain a healthy weight.  Reducing calorie intake and making food choices that are low in sodium, saturated fat, trans fat, and cholesterol are recommended to manage  weight.  Stop drug abuse.  Avoid taking birth control pills.  Talk to your health care provider about the risks of taking birth control pills if you are over 35 years old, smoke, get migraines, or have ever had a blood clot.  Get evaluated for sleep disorders (sleep apnea).  Talk to your health care provider about getting a sleep evaluation if you snore a lot or have excessive sleepiness.  Take medicines only as directed by your health care provider.  For some people, aspirin or blood thinners (anticoagulants) are helpful in reducing the risk of forming abnormal blood clots that can lead to stroke. If you have the irregular heart rhythm of atrial fibrillation, you should be on a blood thinner unless there is a good reason you cannot take them.  Understand all your medicine instructions.  Make sure that other conditions (such as anemia or atherosclerosis) are addressed. SEEK IMMEDIATE MEDICAL CARE IF:   You have sudden weakness or numbness of the face, arm, or leg, especially on one side of the body.  Your face or eyelid droops to one side.  You have sudden confusion.  You have trouble speaking (aphasia) or understanding.  You have sudden trouble seeing in one or both eyes.  You have sudden trouble walking.  You have dizziness.  You have a loss of balance or coordination.  You have a sudden, severe headache with no known cause.  You have new chest pain or an irregular heartbeat. Any of these symptoms may represent a serious problem that is an emergency. Do not wait to see if the symptoms will   go away. Get medical help at once. Call your local emergency services (911 in [place] Do not drive yourself to the hospital.   This information is not intended to replace advice given to you by your health care provider. Make sure you discuss any questions you have with your health care provider.   Document Released: 10/16/2004 Document Revised: 09/29/2014 Document Reviewed:  03/11/2013 Elsevier Interactive Patient Education 2016 Elsevier Inc.     Steps to Quit Smoking  Smoking tobacco can be harmful to your health and can affect almost every organ in your body. Smoking puts you, and those around you, at risk for developing many serious chronic diseases. Quitting smoking is difficult, but it is one of the best things that you can do for your health. It is never too late to quit. WHAT ARE THE BENEFITS OF QUITTING SMOKING? When you quit smoking, you lower your risk of developing serious diseases and conditions, such as:  Lung cancer or lung disease, such as COPD.  Heart disease.  Stroke.  Heart attack.  Infertility.  Osteoporosis and bone fractures. Additionally, symptoms such as coughing, wheezing, and shortness of breath may get better when you quit. You may also find that you get sick less often because your body is stronger at fighting off colds and infections. If you are pregnant, quitting smoking can help to reduce your chances of having a baby of low birth weight. HOW DO I GET READY TO QUIT? When you decide to quit smoking, create a plan to make sure that you are successful. Before you quit:  Pick a date to quit. Set a date within the next two weeks to give you time to prepare.  Write down the reasons why you are quitting. Keep this list in places where you will see it often, such as on your bathroom mirror or in your car or wallet.  Identify the people, places, things, and activities that make you want to smoke (triggers) and avoid them. Make sure to take these actions:  Throw away all cigarettes at home, at work, and in your car.  Throw away smoking accessories, such as ashtrays and lighters.  Clean your car and make sure to empty the ashtray.  Clean your home, including curtains and carpets.  Tell your family, friends, and coworkers that you are quitting. Support from your loved ones can make quitting easier.  Talk with your health care  provider about your options for quitting smoking.  Find out what treatment options are covered by your health insurance. WHAT STRATEGIES CAN I USE TO QUIT SMOKING?  Talk with your healthcare provider about different strategies to quit smoking. Some strategies include:  Quitting smoking altogether instead of gradually lessening how much you smoke over a period of time. Research shows that quitting "cold turkey" is more successful than gradually quitting.  Attending in-person counseling to help you build problem-solving skills. You are more likely to have success in quitting if you attend several counseling sessions. Even short sessions of 10 minutes can be effective.  Finding resources and support systems that can help you to quit smoking and remain smoke-free after you quit. These resources are most helpful when you use them often. They can include:  Online chats with a counselor.  Telephone quitlines.  Printed self-help materials.  Support groups or group counseling.  Text messaging programs.  Mobile phone applications.  Taking medicines to help you quit smoking. (If you are pregnant or breastfeeding, talk with your health care provider first.) Some   medicines contain nicotine and some do not. Both types of medicines help with cravings, but the medicines that include nicotine help to relieve withdrawal symptoms. Your health care provider may recommend:  Nicotine patches, gum, or lozenges.  Nicotine inhalers or sprays.  Non-nicotine medicine that is taken by mouth. Talk with your health care provider about combining strategies, such as taking medicines while you are also receiving in-person counseling. Using these two strategies together makes you more likely to succeed in quitting than if you used either strategy on its own. If you are pregnant or breastfeeding, talk with your health care provider about finding counseling or other support strategies to quit smoking. Do not take  medicine to help you quit smoking unless told to do so by your health care provider. WHAT THINGS CAN I DO TO MAKE IT EASIER TO QUIT? Quitting smoking might feel overwhelming at first, but there is a lot that you can do to make it easier. Take these important actions:  Reach out to your family and friends and ask that they support and encourage you during this time. Call telephone quitlines, reach out to support groups, or work with a counselor for support.  Ask people who smoke to avoid smoking around you.  Avoid places that trigger you to smoke, such as bars, parties, or smoke-break areas at work.  Spend time around people who do not smoke.  Lessen stress in your life, because stress can be a smoking trigger for some people. To lessen stress, try:  Exercising regularly.  Deep-breathing exercises.  Yoga.  Meditating.  Performing a body scan. This involves closing your eyes, scanning your body from head to toe, and noticing which parts of your body are particularly tense. Purposefully relax the muscles in those areas.  Download or purchase mobile phone or tablet apps (applications) that can help you stick to your quit plan by providing reminders, tips, and encouragement. There are many free apps, such as QuitGuide from the CDC (Centers for Disease Control and Prevention). You can find other support for quitting smoking (smoking cessation) through smokefree.gov and other websites. HOW WILL I FEEL WHEN I QUIT SMOKING? Within the first 24 hours of quitting smoking, you may start to feel some withdrawal symptoms. These symptoms are usually most noticeable 2-3 days after quitting, but they usually do not last beyond 2-3 weeks. Changes or symptoms that you might experience include:  Mood swings.  Restlessness, anxiety, or irritation.  Difficulty concentrating.  Dizziness.  Strong cravings for sugary foods in addition to nicotine.  Mild weight  gain.  Constipation.  Nausea.  Coughing or a sore throat.  Changes in how your medicines work in your body.  A depressed mood.  Difficulty sleeping (insomnia). After the first 2-3 weeks of quitting, you may start to notice more positive results, such as:  Improved sense of smell and taste.  Decreased coughing and sore throat.  Slower heart rate.  Lower blood pressure.  Clearer skin.  The ability to breathe more easily.  Fewer sick days. Quitting smoking is very challenging for most people. Do not get discouraged if you are not successful the first time. Some people need to make many attempts to quit before they achieve long-term success. Do your best to stick to your quit plan, and talk with your health care provider if you have any questions or concerns.   This information is not intended to replace advice given to you by your health care provider. Make sure you discuss any questions   you have with your health care provider.   Document Released: 09/02/2001 Document Revised: 01/23/2015 Document Reviewed: 01/23/2015 Elsevier Interactive Patient Education 2016 Elsevier Inc.  

## 2016-07-26 ENCOUNTER — Other Ambulatory Visit: Payer: Self-pay | Admitting: Cardiology

## 2016-08-12 NOTE — Addendum Note (Signed)
Addended by: Lianne Cure A on: 08/12/2016 01:53 PM   Modules accepted: Orders

## 2017-04-28 DIAGNOSIS — Z720 Tobacco use: Secondary | ICD-10-CM | POA: Diagnosis not present

## 2017-04-28 DIAGNOSIS — J189 Pneumonia, unspecified organism: Secondary | ICD-10-CM | POA: Diagnosis not present

## 2017-04-28 DIAGNOSIS — R0682 Tachypnea, not elsewhere classified: Secondary | ICD-10-CM | POA: Diagnosis not present

## 2017-04-28 DIAGNOSIS — R062 Wheezing: Secondary | ICD-10-CM | POA: Diagnosis not present

## 2017-04-28 DIAGNOSIS — R0902 Hypoxemia: Secondary | ICD-10-CM | POA: Diagnosis not present

## 2017-04-28 DIAGNOSIS — E871 Hypo-osmolality and hyponatremia: Secondary | ICD-10-CM | POA: Diagnosis not present

## 2017-04-28 DIAGNOSIS — J441 Chronic obstructive pulmonary disease with (acute) exacerbation: Secondary | ICD-10-CM | POA: Diagnosis not present

## 2017-04-28 DIAGNOSIS — R0602 Shortness of breath: Secondary | ICD-10-CM | POA: Diagnosis not present

## 2017-04-29 DIAGNOSIS — R911 Solitary pulmonary nodule: Secondary | ICD-10-CM | POA: Diagnosis not present

## 2017-05-13 DIAGNOSIS — R630 Anorexia: Secondary | ICD-10-CM | POA: Diagnosis not present

## 2017-05-13 DIAGNOSIS — Z299 Encounter for prophylactic measures, unspecified: Secondary | ICD-10-CM | POA: Diagnosis not present

## 2017-05-13 DIAGNOSIS — J449 Chronic obstructive pulmonary disease, unspecified: Secondary | ICD-10-CM | POA: Diagnosis not present

## 2017-05-13 DIAGNOSIS — I10 Essential (primary) hypertension: Secondary | ICD-10-CM | POA: Diagnosis not present

## 2017-05-13 DIAGNOSIS — R911 Solitary pulmonary nodule: Secondary | ICD-10-CM | POA: Diagnosis not present

## 2017-05-13 DIAGNOSIS — Z713 Dietary counseling and surveillance: Secondary | ICD-10-CM | POA: Diagnosis not present

## 2017-05-13 DIAGNOSIS — I2510 Atherosclerotic heart disease of native coronary artery without angina pectoris: Secondary | ICD-10-CM | POA: Diagnosis not present

## 2017-05-15 ENCOUNTER — Telehealth: Payer: Self-pay | Admitting: Cardiology

## 2017-05-15 ENCOUNTER — Encounter: Payer: Self-pay | Admitting: *Deleted

## 2017-05-15 NOTE — Telephone Encounter (Signed)
Patient called stating that he continues to having swelling in his feet, ankles and legs. States that was recently at [facility]. Patient is on oxygen.   Please call [phone]

## 2017-05-15 NOTE — Telephone Encounter (Signed)
Pt says for last 2-3 weeks swelling in feet mostly R side - pt denies weight gain/dizziness/SOB/chest pain says HR remains in 70s didn't know BP - says he was at [facility] recently for pneumonia (will request records) - says was put on anitibiotics/oxygen/prednisone - was mostly concerned over swelling - past due for f/u with Dr Harl Bowie (doesn't have any appts until late October)  scheduled pt with Jory Sims for 9/10

## 2017-05-15 NOTE — Telephone Encounter (Signed)
Prednisone can sometimes cause some fluid retention. We will evaluate at his appt in September, if significant increase before them have him call us back   Zandra Abts MD

## 2017-05-19 ENCOUNTER — Other Ambulatory Visit (HOSPITAL_COMMUNITY): Payer: Self-pay | Admitting: Internal Medicine

## 2017-05-19 DIAGNOSIS — R911 Solitary pulmonary nodule: Secondary | ICD-10-CM

## 2017-06-01 ENCOUNTER — Ambulatory Visit: Payer: Medicare Other | Admitting: Adult Health

## 2017-06-02 ENCOUNTER — Encounter (HOSPITAL_COMMUNITY)
Admission: RE | Admit: 2017-06-02 | Discharge: 2017-06-02 | Disposition: A | Discharge status: Home / Self Care | Payer: Medicare Other | Source: Ambulatory Visit | Attending: Internal Medicine | Admitting: Internal Medicine

## 2017-06-02 DIAGNOSIS — R911 Solitary pulmonary nodule: Secondary | ICD-10-CM | POA: Insufficient documentation

## 2017-06-02 LAB — GLUCOSE, CAPILLARY: GLUCOSE-CAPILLARY: 136 mg/dL — AB (ref 65–99)

## 2017-06-02 MED ORDER — FLUDEOXYGLUCOSE F - 18 (FDG) INJECTION
5.7000 | Freq: Once | INTRAVENOUS | Status: AC | PRN
  Administered 2017-06-02: 5.7 via INTRAVENOUS

## 2017-06-08 DIAGNOSIS — I2510 Atherosclerotic heart disease of native coronary artery without angina pectoris: Secondary | ICD-10-CM | POA: Diagnosis not present

## 2017-06-08 DIAGNOSIS — Z0000 Encounter for general adult medical examination without abnormal findings: Secondary | ICD-10-CM | POA: Diagnosis not present

## 2017-06-08 DIAGNOSIS — Z7189 Other specified counseling: Secondary | ICD-10-CM | POA: Diagnosis not present

## 2017-06-08 DIAGNOSIS — Z1389 Encounter for screening for other disorder: Secondary | ICD-10-CM | POA: Diagnosis not present

## 2017-06-08 DIAGNOSIS — F419 Anxiety disorder, unspecified: Secondary | ICD-10-CM | POA: Diagnosis not present

## 2017-06-08 DIAGNOSIS — I10 Essential (primary) hypertension: Secondary | ICD-10-CM | POA: Diagnosis not present

## 2017-06-08 DIAGNOSIS — Z299 Encounter for prophylactic measures, unspecified: Secondary | ICD-10-CM | POA: Diagnosis not present

## 2017-06-08 DIAGNOSIS — Z681 Body mass index (BMI) 19 or less, adult: Secondary | ICD-10-CM | POA: Diagnosis not present

## 2017-06-08 DIAGNOSIS — Z1211 Encounter for screening for malignant neoplasm of colon: Secondary | ICD-10-CM | POA: Diagnosis not present

## 2017-06-08 DIAGNOSIS — Z79899 Other long term (current) drug therapy: Secondary | ICD-10-CM | POA: Diagnosis not present

## 2017-06-08 DIAGNOSIS — F17210 Nicotine dependence, cigarettes, uncomplicated: Secondary | ICD-10-CM | POA: Diagnosis not present

## 2017-06-08 DIAGNOSIS — R911 Solitary pulmonary nodule: Secondary | ICD-10-CM | POA: Diagnosis not present

## 2017-06-08 DIAGNOSIS — J449 Chronic obstructive pulmonary disease, unspecified: Secondary | ICD-10-CM | POA: Diagnosis not present

## 2017-06-08 DIAGNOSIS — Z125 Encounter for screening for malignant neoplasm of prostate: Secondary | ICD-10-CM | POA: Diagnosis not present

## 2017-06-16 DIAGNOSIS — Z23 Encounter for immunization: Secondary | ICD-10-CM | POA: Diagnosis not present

## 2017-07-10 ENCOUNTER — Institutional Professional Consult (permissible substitution): Payer: Medicare Other | Admitting: Emergency Medicine

## 2017-07-20 ENCOUNTER — Encounter: Payer: Self-pay | Admitting: Family

## 2017-07-20 ENCOUNTER — Ambulatory Visit (INDEPENDENT_AMBULATORY_CARE_PROVIDER_SITE_OTHER): Payer: Medicare Other | Admitting: Family

## 2017-07-20 ENCOUNTER — Ambulatory Visit (HOSPITAL_COMMUNITY)
Admission: RE | Admit: 2017-07-20 | Discharge: 2017-07-20 | Disposition: A | Discharge status: Home / Self Care | Payer: Medicare Other | Source: Ambulatory Visit | Attending: Surgery | Admitting: Surgery

## 2017-07-20 VITALS — BP 129/68 | HR 78 | Temp 98.0°F | Ht 69.0 in | Wt 118.0 lb

## 2017-07-20 DIAGNOSIS — F17200 Nicotine dependence, unspecified, uncomplicated: Secondary | ICD-10-CM | POA: Insufficient documentation

## 2017-07-20 DIAGNOSIS — I6523 Occlusion and stenosis of bilateral carotid arteries: Secondary | ICD-10-CM

## 2017-07-20 DIAGNOSIS — Z9889 Other specified postprocedural states: Secondary | ICD-10-CM | POA: Insufficient documentation

## 2017-07-20 DIAGNOSIS — R0989 Other specified symptoms and signs involving the circulatory and respiratory systems: Secondary | ICD-10-CM | POA: Diagnosis not present

## 2017-07-20 LAB — VAS US CAROTID
LCCADSYS: -185 cm/s
LCCAPDIAS: 17 cm/s
LEFT ECA DIAS: 0 cm/s
LEFT VERTEBRAL DIAS: -5 cm/s
LICADSYS: -128 cm/s
Left CCA dist dias: -22 cm/s
Left CCA prox sys: 141 cm/s
Left ICA dist dias: -28 cm/s
Left ICA prox dias: -88 cm/s
Left ICA prox sys: -303 cm/s
RCCAPSYS: 162 cm/s
RIGHT CCA MID DIAS: -16 cm/s
RIGHT ECA DIAS: 0 cm/s
RIGHT VERTEBRAL DIAS: 0 cm/s
Right CCA prox dias: 17 cm/s
Right cca dist sys: -163 cm/s

## 2017-07-20 NOTE — Progress Notes (Signed)
Chief Complaint: Follow up Extracranial Carotid Artery Stenosis   History of Present Illness  Vincent Gilbert is a 71 y.o. male patient of Dr. Kellie Simmering who suffered a severe left brain event in May 2013. He was treated with TPA and this resolved within 24 hours. He was found to have a severe right ICA stenosis and a moderate left ICA stenosis. He initially underwent right carotid endarterectomy on 03-14-12 by Dr. Kellie Simmering and did well. He never returned for his left carotid endarterectomy which had been recommended. He has been treated with aspirin since that time with no neurologic symptoms. He denies lateralizing weakness, aphasia, amaurosis fugax, diplopia, blurred vision, or syncope. He has had laser surgery on his right eye.   He denies unilateral steal sx's in upper extremities.   He denies any subsequent stroke or TIA since 2012. He states he had an MI with coronary artery stenting at the same time he had a stroke.  He has COPD. He uses supplemental O2 when he is active, has O2 concentrator with him.   He states he had testing at the [facility] for agent orange.   He was hospitalized for pneumonia in August 2018 in [facility].    He denies claudication like sx's with walking. He does report weak feeling in anterior thighs when he climbs stairs.   Pt Diabetic: no Pt smoker: smoker  (1/3 ppd, he quit 2-3 x for about a week, started smoking at age 12 yrs)  Pt meds include: Statin : yes ASA: yes Other anticoagulants/antiplatelets: no   Past Medical History:  Diagnosis Date  . Carotid artery occlusion   . Coronary artery disease   . GERD (gastroesophageal reflux disease)   . Heart murmur   . Hypertension   . Myocardial infarction ([place]) 12/12  . Stroke [facility]) 09/10/2011    Social History Social History  Substance Use Topics  . Smoking status: Current Every Day Smoker    Packs/day: 0.75    Years: 40.00    Types: Cigarettes    Start date: 02/26/1956  .  Smokeless tobacco: Never Used     Comment: 1/2 pk per day  . Alcohol use 25.2 oz/week    42 Cans of beer per week     Comment: 2-3x/ wk    Family History Family History  Problem Relation Age of Onset  . Heart disease Mother   . Heart disease Father     Surgical History Past Surgical History:  Procedure Laterality Date  . CARDIAC CATHETERIZATION  09-15-2011   90% mid RCA stenosis. Normal EF  . CORONARY ANGIOPLASTY WITH STENT PLACEMENT  09-15-2011   mid RCA: 3.5 X32 mm Promus DES  . ENDARTERECTOMY  03/11/2012   Procedure: ENDARTERECTOMY CAROTID;  Surgeon: Mal Misty, MD;  Location: [facility];  Service: Vascular;  Laterality: Right;  . EYE SURGERY  rt  . LEFT HEART CATHETERIZATION WITH CORONARY ANGIOGRAM N/A 09/15/2011   Procedure: LEFT HEART CATHETERIZATION WITH CORONARY ANGIOGRAM;  Surgeon: Sherren Mocha, MD;  Location: [facility] CATH LAB;  Service: Cardiovascular;  Laterality: N/A;  . PERCUTANEOUS CORONARY STENT INTERVENTION (PCI-S) N/A 09/15/2011   Procedure: PERCUTANEOUS CORONARY STENT INTERVENTION (PCI-S);  Surgeon: Sherren Mocha, MD;  Location: [facility] CATH LAB;  Service: Cardiovascular;  Laterality: N/A;  . VASECTOMY    . YAG LASER APPLICATION Right 0/25/4270   Procedure: YAG LASER APPLICATION;  Surgeon: Williams Che, MD;  Location: [facility];  Service: Ophthalmology;  Laterality: Right;  Allergies  Allergen Reactions  . Oxycodone     nausea  . Codeine Palpitations    Current Outpatient Prescriptions  Medication Sig Dispense Refill  . albuterol (PROVENTIL HFA;VENTOLIN HFA) 108 (90 Base) MCG/ACT inhaler Inhale 2 puffs into the lungs every 6 (six) hours as needed for wheezing or shortness of breath.    Marland Kitchen amLODipine (NORVASC) 10 MG tablet Take 10 mg by mouth daily.    Marland Kitchen aspirin EC 81 MG tablet Take 81 mg by mouth every morning.     . budesonide-formoterol (SYMBICORT) 80-4.5 MCG/ACT inhaler Inhale 2 puffs into the lungs 2 (two) times daily.    . cetirizine (ZYRTEC) 10 MG tablet  Take 10 mg by mouth daily.    . fluticasone (FLONASE) 50 MCG/ACT nasal spray Place 2 sprays into both nostrils daily.  4  . lisinopril (PRINIVIL,ZESTRIL) 40 MG tablet Take 1 tablet (40 mg total) by mouth daily. 90 tablet 3  . metoprolol (LOPRESSOR) 100 MG tablet TAKE 1 TABLET (100 MG TOTAL) BY MOUTH 2 (TWO) TIMES DAILY. 60 tablet 3  . Multiple Vitamin (MULTIVITAMIN) tablet Take 1 tablet by mouth daily.    Marland Kitchen oral electrolytes (THERMOTABS) TABS tablet Take 1 tablet by mouth daily.    . pantoprazole (PROTONIX) 40 MG tablet Take 40 mg by mouth every morning.     . Potassium 99 MG TABS Take 1 tablet by mouth daily.    . rosuvastatin (CRESTOR) 40 MG tablet Take 40 mg by mouth daily after supper.    . tiotropium (SPIRIVA) 18 MCG inhalation capsule Place 18 mcg into inhaler and inhale daily.     No current facility-administered medications for this visit.     Review of Systems : See HPI for pertinent positives and negatives.  Physical Examination  Vitals:   07/20/17 1423 07/20/17 1424 07/20/17 1425  BP: (!) 173/76 (!) 153/79 129/68  Pulse: 81  78  Temp: 98 F (36.7 C)    SpO2:  94% 96%  Weight: 118 lb (53.5 kg)    Height: 5\' 9"  (1.753 m)     Body mass index is 17.43 kg/m.  General: Cachectic thin male in NAD GAIT: normal Eyes: PERRLA Pulmonary:  Respirations are somewhat labored at rest with some use of accessory muscles, limited air movement in all fields, no rales, rhonchi, or wheezes. Occasional moist cough. O2 concentrator connected to pt via nasal cannula.   Cardiac: regular rhythm, no detected murmur.  VASCULAR EXAM Carotid Bruits Right Left   Negative Negative     Abdominal aortic pulse is moderately palpable. Radial pulses are 2+ palpable and equal.                                                                                                                                          LE Pulses Right Left       FEMORAL  3+ palpable  not  palpable        POPLITEAL   not palpable   not palpable       POSTERIOR TIBIAL  1+ palpable   not palpable        DORSALIS PEDIS      ANTERIOR TIBIAL 2+ palpable  faintly palpable     Gastrointestinal: soft, nontender, BS WNL, no r/g, scaphoid abdomen, no palpable masses.  Musculoskeletal: No muscle atrophy/wasting. M/S 5/5 throughout, extremities without ischemic changes.  Neurologic: A&O X 3; appropriate affect, speech is normal, CN 2-12 intact, pain and light touch intact in extremities, Motor exam as listed above.     Assessment: COBAIN MORICI is a 71 y.o. male whowho is s/p  right carotid endarterectomy in 2013. He suffered a severe left brain event in May 2013. He was treated with TPA and this resolved within 24 hours. He was found to have a severe right ICA stenosis and a moderate left ICA stenosis. He has not had any subsequent strokes of TIA's.    He has faintly palpable left DP pulse, right pedal pulses are more palpable; will check ABI's when he returns. Possible claudication is anterior thighs with climbing stairs vs deconditioning.   DATA Carotid Duplex (07/20/17): Right ICA: CEA site with 40-59% stenosis, most likely due to vessel diameter change and/or hyperplasia at the proximal and mid ICA. Left ICA: 60-79% stenosis.  Right vertebral artery is antegrade with no diastolic flow, left vertebral artery flow is antegrade. Bilateral subclavian arteries are multiphasic. Disease progression of the left ICA compared to the exam on 07-15-16.   Plan:  The patient was counseled re smoking cessation and given several free resources re smoking cessation.   Follow-up in 6 months with Carotid Duplex scan. and ABI's.   I discussed in depth with the patient the nature of atherosclerosis, and emphasized the importance of maximal medical management including strict control of blood pressure, blood glucose, and lipid levels, obtaining regular exercise, and cessation of smoking.  The  patient is aware that without maximal medical management the underlying atherosclerotic disease process will progress, limiting the benefit of any interventions. The patient was given information about stroke prevention and what symptoms should prompt the patient to seek immediate medical care. Thank you for allowing [place] to participate in this patient's care.  Clemon Chambers, RN, MSN, FNP-C [facility] Office: [phone]  Clinic Physician: Trula Slade  07/20/17 2:31 PM

## 2017-07-20 NOTE — Patient Instructions (Addendum)
Steps to Quit Smoking Smoking tobacco can be bad for your health. It can also affect almost every organ in your body. Smoking puts you and people around you at risk for many serious long-lasting (chronic) diseases. Quitting smoking is hard, but it is one of the best things that you can do for your health. It is never too late to quit. What are the benefits of quitting smoking? When you quit smoking, you lower your risk for getting serious diseases and conditions. They can include:  Lung cancer or lung disease.  Heart disease.  Stroke.  Heart attack.  Not being able to have children (infertility).  Weak bones (osteoporosis) and broken bones (fractures).  If you have coughing, wheezing, and shortness of breath, those symptoms may get better when you quit. You may also get sick less often. If you are pregnant, quitting smoking can help to lower your chances of having a baby of low birth weight. What can I do to help me quit smoking? Talk with your doctor about what can help you quit smoking. Some things you can do (strategies) include:  Quitting smoking totally, instead of slowly cutting back how much you smoke over a period of time.  Going to in-person counseling. You are more likely to quit if you go to many counseling sessions.  Using resources and support systems, such as: ? Online chats with a counselor. ? Phone quitlines. ? Printed self-help materials. ? Support groups or group counseling. ? Text messaging programs. ? Mobile phone apps or applications.  Taking medicines. Some of these medicines may have nicotine in them. If you are pregnant or breastfeeding, do not take any medicines to quit smoking unless your doctor says it is okay. Talk with your doctor about counseling or other things that can help you.  Talk with your doctor about using more than one strategy at the same time, such as taking medicines while you are also going to in-person counseling. This can help make  quitting easier. What things can I do to make it easier to quit? Quitting smoking might feel very hard at first, but there is a lot that you can do to make it easier. Take these steps:  Talk to your family and friends. Ask them to support and encourage you.  Call phone quitlines, reach out to support groups, or work with a counselor.  Ask people who smoke to not smoke around you.  Avoid places that make you want (trigger) to smoke, such as: ? Bars. ? Parties. ? Smoke-break areas at work.  Spend time with people who do not smoke.  Lower the stress in your life. Stress can make you want to smoke. Try these things to help your stress: ? Getting regular exercise. ? Deep-breathing exercises. ? Yoga. ? Meditating. ? Doing a body scan. To do this, close your eyes, focus on one area of your body at a time from head to toe, and notice which parts of your body are tense. Try to relax the muscles in those areas.  Download or buy apps on your mobile phone or tablet that can help you stick to your quit plan. There are many free apps, such as QuitGuide from the CDC (Centers for Disease Control and Prevention). You can find more support from smokefree.gov and other websites.  This information is not intended to replace advice given to you by your health care provider. Make sure you discuss any questions you have with your health care provider. Document Released: 07/05/2009 Document   Revised: 05/06/2016 Document Reviewed: 01/23/2015 Elsevier Interactive Patient Education  2018 Elsevier Inc.     Stroke Prevention Some medical conditions and behaviors are associated with an increased chance of having a stroke. You may prevent a stroke by making healthy choices and managing medical conditions. How can I reduce my risk of having a stroke?  Stay physically active. Get at least 30 minutes of activity on most or all days.  Do not smoke. It may also be helpful to avoid exposure to secondhand  smoke.  Limit alcohol use. Moderate alcohol use is considered to be: ? No more than 2 drinks per day for men. ? No more than 1 drink per day for nonpregnant women.  Eat healthy foods. This involves: ? Eating 5 or more servings of fruits and vegetables a day. ? Making dietary changes that address high blood pressure (hypertension), high cholesterol, diabetes, or obesity.  Manage your cholesterol levels. ? Making food choices that are high in fiber and low in saturated fat, trans fat, and cholesterol may control cholesterol levels. ? Take any prescribed medicines to control cholesterol as directed by your health care provider.  Manage your diabetes. ? Controlling your carbohydrate and sugar intake is recommended to manage diabetes. ? Take any prescribed medicines to control diabetes as directed by your health care provider.  Control your hypertension. ? Making food choices that are low in salt (sodium), saturated fat, trans fat, and cholesterol is recommended to manage hypertension. ? Ask your health care provider if you need treatment to lower your blood pressure. Take any prescribed medicines to control hypertension as directed by your health care provider. ? If you are 18-39 years of age, have your blood pressure checked every 3-5 years. If you are 40 years of age or older, have your blood pressure checked every year.  Maintain a healthy weight. ? Reducing calorie intake and making food choices that are low in sodium, saturated fat, trans fat, and cholesterol are recommended to manage weight.  Stop drug abuse.  Avoid taking birth control pills. ? Talk to your health care provider about the risks of taking birth control pills if you are over 35 years old, smoke, get migraines, or have ever had a blood clot.  Get evaluated for sleep disorders (sleep apnea). ? Talk to your health care provider about getting a sleep evaluation if you snore a lot or have excessive sleepiness.  Take  medicines only as directed by your health care provider. ? For some people, aspirin or blood thinners (anticoagulants) are helpful in reducing the risk of forming abnormal blood clots that can lead to stroke. If you have the irregular heart rhythm of atrial fibrillation, you should be on a blood thinner unless there is a good reason you cannot take them. ? Understand all your medicine instructions.  Make sure that other conditions (such as anemia or atherosclerosis) are addressed. Get help right away if:  You have sudden weakness or numbness of the face, arm, or leg, especially on one side of the body.  Your face or eyelid droops to one side.  You have sudden confusion.  You have trouble speaking (aphasia) or understanding.  You have sudden trouble seeing in one or both eyes.  You have sudden trouble walking.  You have dizziness.  You have a loss of balance or coordination.  You have a sudden, severe headache with no known cause.  You have new chest pain or an irregular heartbeat. Any of these symptoms may represent   a serious problem that is an emergency. Do not wait to see if the symptoms will go away. Get medical help at once. Call your local emergency services (911 in [place] Do not drive yourself to the hospital. This information is not intended to replace advice given to you by your health care provider. Make sure you discuss any questions you have with your health care provider. Document Released: 10/16/2004 Document Revised: 02/14/2016 Document Reviewed: 03/11/2013 Elsevier Interactive Patient Education  2017 Elsevier Inc.  

## 2017-07-30 NOTE — Addendum Note (Signed)
Addended by: Lianne Cure A on: 07/30/2017 12:23 PM   Modules accepted: Orders

## 2017-10-12 DIAGNOSIS — H26492 Other secondary cataract, left eye: Secondary | ICD-10-CM | POA: Diagnosis not present

## 2018-01-19 ENCOUNTER — Encounter (HOSPITAL_COMMUNITY): Payer: Medicare Other

## 2018-01-19 ENCOUNTER — Ambulatory Visit: Payer: Medicare Other | Admitting: Family

## 2018-03-18 ENCOUNTER — Inpatient Hospital Stay (HOSPITAL_COMMUNITY)
Admission: EM | Admit: 2018-03-18 | Discharge: 2018-03-27 | DRG: 193 | Disposition: A | Discharge status: Home Health | Payer: Medicare Other | Attending: Internal Medicine | Admitting: Internal Medicine

## 2018-03-18 ENCOUNTER — Encounter (HOSPITAL_COMMUNITY): Payer: Self-pay

## 2018-03-18 ENCOUNTER — Emergency Department (HOSPITAL_COMMUNITY): Payer: Medicare Other

## 2018-03-18 DIAGNOSIS — I10 Essential (primary) hypertension: Secondary | ICD-10-CM | POA: Diagnosis present

## 2018-03-18 DIAGNOSIS — R062 Wheezing: Secondary | ICD-10-CM | POA: Diagnosis not present

## 2018-03-18 DIAGNOSIS — F17210 Nicotine dependence, cigarettes, uncomplicated: Secondary | ICD-10-CM | POA: Diagnosis present

## 2018-03-18 DIAGNOSIS — E43 Unspecified severe protein-calorie malnutrition: Secondary | ICD-10-CM | POA: Diagnosis present

## 2018-03-18 DIAGNOSIS — Z955 Presence of coronary angioplasty implant and graft: Secondary | ICD-10-CM

## 2018-03-18 DIAGNOSIS — A419 Sepsis, unspecified organism: Secondary | ICD-10-CM

## 2018-03-18 DIAGNOSIS — R0602 Shortness of breath: Secondary | ICD-10-CM | POA: Diagnosis not present

## 2018-03-18 DIAGNOSIS — Z8673 Personal history of transient ischemic attack (TIA), and cerebral infarction without residual deficits: Secondary | ICD-10-CM

## 2018-03-18 DIAGNOSIS — J181 Lobar pneumonia, unspecified organism: Principal | ICD-10-CM | POA: Diagnosis present

## 2018-03-18 DIAGNOSIS — R05 Cough: Secondary | ICD-10-CM | POA: Diagnosis not present

## 2018-03-18 DIAGNOSIS — R197 Diarrhea, unspecified: Secondary | ICD-10-CM | POA: Diagnosis present

## 2018-03-18 DIAGNOSIS — J189 Pneumonia, unspecified organism: Secondary | ICD-10-CM | POA: Diagnosis present

## 2018-03-18 DIAGNOSIS — E785 Hyperlipidemia, unspecified: Secondary | ICD-10-CM | POA: Diagnosis present

## 2018-03-18 DIAGNOSIS — R911 Solitary pulmonary nodule: Secondary | ICD-10-CM | POA: Diagnosis present

## 2018-03-18 DIAGNOSIS — I2510 Atherosclerotic heart disease of native coronary artery without angina pectoris: Secondary | ICD-10-CM | POA: Diagnosis present

## 2018-03-18 DIAGNOSIS — Z7982 Long term (current) use of aspirin: Secondary | ICD-10-CM

## 2018-03-18 DIAGNOSIS — Z9981 Dependence on supplemental oxygen: Secondary | ICD-10-CM

## 2018-03-18 DIAGNOSIS — Z9852 Vasectomy status: Secondary | ICD-10-CM

## 2018-03-18 DIAGNOSIS — Z681 Body mass index (BMI) 19 or less, adult: Secondary | ICD-10-CM

## 2018-03-18 DIAGNOSIS — J441 Chronic obstructive pulmonary disease with (acute) exacerbation: Secondary | ICD-10-CM

## 2018-03-18 DIAGNOSIS — I252 Old myocardial infarction: Secondary | ICD-10-CM

## 2018-03-18 DIAGNOSIS — Z7951 Long term (current) use of inhaled steroids: Secondary | ICD-10-CM

## 2018-03-18 DIAGNOSIS — Z79899 Other long term (current) drug therapy: Secondary | ICD-10-CM

## 2018-03-18 DIAGNOSIS — R609 Edema, unspecified: Secondary | ICD-10-CM | POA: Diagnosis not present

## 2018-03-18 DIAGNOSIS — Z885 Allergy status to narcotic agent status: Secondary | ICD-10-CM

## 2018-03-18 DIAGNOSIS — F1010 Alcohol abuse, uncomplicated: Secondary | ICD-10-CM | POA: Diagnosis present

## 2018-03-18 DIAGNOSIS — R0789 Other chest pain: Secondary | ICD-10-CM | POA: Diagnosis not present

## 2018-03-18 DIAGNOSIS — K219 Gastro-esophageal reflux disease without esophagitis: Secondary | ICD-10-CM | POA: Diagnosis present

## 2018-03-18 DIAGNOSIS — J9621 Acute and chronic respiratory failure with hypoxia: Secondary | ICD-10-CM | POA: Diagnosis present

## 2018-03-18 DIAGNOSIS — Z8249 Family history of ischemic heart disease and other diseases of the circulatory system: Secondary | ICD-10-CM

## 2018-03-18 DIAGNOSIS — J439 Emphysema, unspecified: Secondary | ICD-10-CM | POA: Diagnosis present

## 2018-03-18 DIAGNOSIS — R011 Cardiac murmur, unspecified: Secondary | ICD-10-CM | POA: Diagnosis present

## 2018-03-18 HISTORY — DX: Chronic obstructive pulmonary disease, unspecified: J44.9

## 2018-03-18 LAB — CBC
HCT: 41.3 % (ref 39.0–52.0)
Hemoglobin: 14.2 g/dL (ref 13.0–17.0)
MCH: 33.9 pg (ref 26.0–34.0)
MCHC: 34.4 g/dL (ref 30.0–36.0)
MCV: 98.6 fL (ref 78.0–100.0)
PLATELETS: 314 10*3/uL (ref 150–400)
RBC: 4.19 MIL/uL — ABNORMAL LOW (ref 4.22–5.81)
RDW: 11.8 % (ref 11.5–15.5)
WBC: 16.2 10*3/uL — ABNORMAL HIGH (ref 4.0–10.5)

## 2018-03-18 LAB — BASIC METABOLIC PANEL
Anion gap: 8 (ref 5–15)
BUN: 6 mg/dL — ABNORMAL LOW (ref 8–23)
CALCIUM: 8.9 mg/dL (ref 8.9–10.3)
CO2: 33 mmol/L — AB (ref 22–32)
CREATININE: 0.52 mg/dL — AB (ref 0.61–1.24)
Chloride: 86 mmol/L — ABNORMAL LOW (ref 98–111)
GFR calc Af Amer: >60 mL/min (ref >60)
GFR calc non Af Amer: >60 mL/min (ref >60)
GLUCOSE: 119 mg/dL — AB (ref 70–99)
Potassium: 3.6 mmol/L (ref 3.5–5.1)
Sodium: 127 mmol/L — ABNORMAL LOW (ref 135–145)

## 2018-03-18 MED ORDER — CEFTRIAXONE SODIUM 1 G IJ SOLR
1.0000 g | Freq: Once | INTRAMUSCULAR | Status: AC
Start: 2018-03-18 — End: 2018-03-19
  Administered 2018-03-18: 1 g via INTRAVENOUS
  Filled 2018-03-18: qty 10

## 2018-03-18 MED ORDER — SODIUM CHLORIDE 0.9 % IV BOLUS
1000.0000 mL | Freq: Once | INTRAVENOUS | Status: AC
  Administered 2018-03-19: 1000 mL via INTRAVENOUS

## 2018-03-18 MED ORDER — ALBUTEROL SULFATE (2.5 MG/3ML) 0.083% IN NEBU
2.5000 mg | INHALATION_SOLUTION | Freq: Once | RESPIRATORY_TRACT | Status: AC
  Administered 2018-03-18: 2.5 mg via RESPIRATORY_TRACT
  Filled 2018-03-18: qty 3

## 2018-03-18 MED ORDER — SODIUM CHLORIDE 0.9 % IV SOLN
500.0000 mg | Freq: Once | INTRAVENOUS | Status: AC
  Administered 2018-03-19: 500 mg via INTRAVENOUS
  Filled 2018-03-18: qty 500

## 2018-03-18 MED ORDER — IPRATROPIUM-ALBUTEROL 0.5-2.5 (3) MG/3ML IN SOLN
3.0000 mL | Freq: Once | RESPIRATORY_TRACT | Status: AC
  Administered 2018-03-18: 3 mL via RESPIRATORY_TRACT
  Filled 2018-03-18: qty 3

## 2018-03-18 NOTE — ED Triage Notes (Signed)
Pt received 2 albuterol neb treatments and 125 mg of solumedrol by ems en route

## 2018-03-18 NOTE — ED Provider Notes (Signed)
[facility] EMERGENCY DEPARTMENT Provider Note   CSN: [id number] Arrival date & time: 03/18/18  2240     History   Chief Complaint Chief Complaint  Patient presents with  . Shortness of Breath    HPI Vincent Gilbert is a 72 y.o. male.  Patient presents to the emergency department for evaluation of difficulty breathing.  He has had increased cough and shortness of breath with generalized weakness for 2 weeks.  Over the last 2 or 3 days his cough has worsened.  Cough has mostly been nonproductive.  He has been using his breathing treatments without improvement.  Breathing worsened tonight.  He was brought to the ER by EMS from home.  Patient received 2 breathing treatments and Solu-Medrol 125 mg IV during transport.  He reports only minimal improvement.     Past Medical History:  Diagnosis Date  . Carotid artery occlusion   . COPD (chronic obstructive pulmonary disease) ([place])   . Coronary artery disease   . GERD (gastroesophageal reflux disease)   . Heart murmur   . Hypertension   . Myocardial infarction ([place]) 12/12  . Stroke [facility]) 09/10/2011    Patient Active Problem List   Diagnosis Date Noted  . Occlusion and stenosis of carotid artery without mention of cerebral infarction 03/30/2012  . Occlusion and stenosis of carotid artery with cerebral infarction 03/08/2012  . Bruit 01/06/2012  . Coronary artery disease   . Hypertension 09/10/2011  . Tobacco abuse 09/10/2011  . Alcohol abuse 09/10/2011  . GERD (gastroesophageal reflux disease) 09/10/2011  . Acute myocardial infarction of inferoposterior wall, initial episode of care [facility]) 09/10/2011    Past Surgical History:  Procedure Laterality Date  . CARDIAC CATHETERIZATION  09-15-2011   90% mid RCA stenosis. Normal EF  . CORONARY ANGIOPLASTY WITH STENT PLACEMENT  09-15-2011   mid RCA: 3.5 X32 mm Promus DES  . ENDARTERECTOMY  03/11/2012   Procedure: ENDARTERECTOMY CAROTID;  Surgeon: Mal Misty, MD;  Location:  [place];  Service: Vascular;  Laterality: Right;  . EYE SURGERY  rt  . LEFT HEART CATHETERIZATION WITH CORONARY ANGIOGRAM N/A 09/15/2011   Procedure: LEFT HEART CATHETERIZATION WITH CORONARY ANGIOGRAM;  Surgeon: Sherren Mocha, MD;  Location: [facility] CATH LAB;  Service: Cardiovascular;  Laterality: N/A;  . PERCUTANEOUS CORONARY STENT INTERVENTION (PCI-S) N/A 09/15/2011   Procedure: PERCUTANEOUS CORONARY STENT INTERVENTION (PCI-S);  Surgeon: Sherren Mocha, MD;  Location: [facility] CATH LAB;  Service: Cardiovascular;  Laterality: N/A;  . VASECTOMY    . YAG LASER APPLICATION Right 05/16/538   Procedure: YAG LASER APPLICATION;  Surgeon: Williams Che, MD;  Location: [facility];  Service: Ophthalmology;  Laterality: Right;        Home Medications    Prior to Admission medications   Medication Sig Start Date End Date Taking? Authorizing Provider  albuterol (PROVENTIL HFA;VENTOLIN HFA) 108 (90 Base) MCG/ACT inhaler Inhale 2 puffs into the lungs every 6 (six) hours as needed for wheezing or shortness of breath.   Yes [provider]  amLODipine (NORVASC) 10 MG tablet Take 10 mg by mouth daily.   Yes [provider]  aspirin EC 81 MG tablet Take 81 mg by mouth every morning.    Yes [provider]  budesonide-formoterol (SYMBICORT) 80-4.5 MCG/ACT inhaler Inhale 2 puffs into the lungs 2 (two) times daily.   Yes [provider]  cetirizine (ZYRTEC) 10 MG tablet Take 10 mg by mouth daily.   Yes [provider]  fluticasone (FLONASE) 50  MCG/ACT nasal spray Place 2 sprays into both nostrils daily. 11/15/14  Yes [provider]  lisinopril (PRINIVIL,ZESTRIL) 40 MG tablet Take 1 tablet (40 mg total) by mouth daily. 05/04/15  Yes Branch, Alphonse Guild, MD  metoprolol (LOPRESSOR) 100 MG tablet TAKE 1 TABLET (100 MG TOTAL) BY MOUTH 2 (TWO) TIMES DAILY. 07/28/16  Yes BranchAlphonse Guild, MD  mirtazapine (REMERON) 7.5 MG tablet every morning. 03/08/18  Yes [provider]  Multiple Vitamin (MULTIVITAMIN) tablet Take 1 tablet by mouth daily.   Yes [provider]  oral electrolytes (THERMOTABS) TABS tablet Take 1 tablet by mouth daily.   Yes [provider]  pantoprazole (PROTONIX) 40 MG tablet Take 40 mg by mouth every morning.    Yes [provider]  Potassium 99 MG TABS Take 1 tablet by mouth daily.   Yes [provider]  rosuvastatin (CRESTOR) 40 MG tablet Take 40 mg by mouth daily after supper.   Yes [provider]  tiotropium (SPIRIVA) 18 MCG inhalation capsule Place 18 mcg into inhaler and inhale daily.   Yes [provider]    Family History Family History  Problem Relation Age of Onset  . Heart disease Mother   . Heart disease Father     Social History Social History   Tobacco Use  . Smoking status: Current Every Day Smoker    Packs/day: 0.75    Years: 40.00    Pack years: 30.00    Types: Cigarettes    Start date: 02/26/1956  . Smokeless tobacco: Never Used  . Tobacco comment: 1/2 pk per day  Substance Use Topics  . Alcohol use: Yes    Alcohol/week: 25.2 oz    Types: 42 Cans of beer per week    Comment: 2-3x/ wk  . Drug use: No     Allergies   Oxycodone and Codeine   Review of Systems Review of Systems  Respiratory: Positive for cough, chest tightness, shortness of breath and wheezing.   All other systems reviewed and are negative.    Physical Exam Updated Vital Signs BP (!) 151/70   Pulse 82   Temp 98.7 F (37.1 C) (Oral)   Resp (!) 22   Ht 5\' 9"  (1.753 m)   Wt 52.2 kg (115 lb)   SpO2 100%   BMI 16.98 kg/m   Physical Exam  Constitutional: He is oriented to person, place, and time. He appears well-developed and well-nourished. No distress.  HENT:  Head: Normocephalic and atraumatic.  Right Ear: Hearing normal.  Left Ear: Hearing normal.  Nose: Nose normal.  Mouth/Throat: Oropharynx is clear and moist and mucous membranes are normal.  Eyes: Pupils are  equal, round, and reactive to light. Conjunctivae and EOM are normal.  Neck: Normal range of motion. Neck supple.  Cardiovascular: Regular rhythm, S1 normal and S2 normal. Exam reveals no gallop and no friction rub.  No murmur heard. Pulmonary/Chest: Effort normal. No respiratory distress. He has decreased breath sounds. He has wheezes. He has rhonchi. He exhibits no tenderness.  Abdominal: Soft. Normal appearance and bowel sounds are normal. There is no hepatosplenomegaly. There is no tenderness. There is no rebound, no guarding, no tenderness at McBurney's point and negative Murphy's sign. No hernia.  Musculoskeletal: Normal range of motion.  Neurological: He is alert and oriented to person, place, and time. He has normal strength. No cranial nerve deficit or sensory deficit. Coordination normal. GCS eye subscore is 4. GCS verbal subscore is 5. GCS motor  subscore is 6.  Skin: Skin is warm, dry and intact. No rash noted. No cyanosis.  Psychiatric: He has a normal mood and affect. His speech is normal and behavior is normal. Thought content normal.  Nursing note and vitals reviewed.    ED Treatments / Results  Labs (all labs ordered are listed, but only abnormal results are displayed) Labs Reviewed  BASIC METABOLIC PANEL - Abnormal; Notable for the following components:      Result Value   Sodium 127 (*)    Chloride 86 (*)    CO2 33 (*)    Glucose, Bld 119 (*)    BUN 6 (*)    Creatinine, Ser 0.52 (*)    All other components within normal limits  CBC - Abnormal; Notable for the following components:   WBC 16.2 (*)    RBC 4.19 (*)    All other components within normal limits  CULTURE, BLOOD (ROUTINE X 2)  CULTURE, BLOOD (ROUTINE X 2)  I-STAT CG4 LACTIC ACID, ED    EKG None  Radiology Dg Chest Portable 1 View  Result Date: 03/18/2018 CLINICAL DATA:  Dyspnea and chest tightness with cough over the past few days. EXAM: PORTABLE CHEST 1 VIEW COMPARISON:  PET CT 06/02/2017, CXR  04/28/2017 FINDINGS: Emphysematous hyperinflation of the lungs with bibasilar atelectasis. Superimposed small foci of pneumonia is not entirely excluded. Ovoid nodule projecting over the left upper lobe is stable estimated at 19 x 11 mm radiographically versus 17 x 11 mm prior PET-CT. Heart size is within normal limits. Aortic atherosclerosis without aneurysmal dilatation is seen. IMPRESSION: 1. Emphysematous hyperinflation of the lungs with bibasilar airspace disease compatible with pneumonia and/or atelectasis. 2. Stable ovoid nodule in the left upper lobe measuring 19 x 11 mm. 3. Aortic atherosclerosis. Electronically Signed   By: Ashley Royalty M.D.   On: 03/18/2018 23:31    Procedures Procedures (including critical care time)  Medications Ordered in ED Medications  cefTRIAXone (ROCEPHIN) 1 g in sodium chloride 0.9 % 100 mL IVPB (has no administration in time range)  azithromycin (ZITHROMAX) 500 mg in sodium chloride 0.9 % 250 mL IVPB (has no administration in time range)  sodium chloride 0.9 % bolus 1,000 mL (has no administration in time range)  ipratropium-albuterol (DUONEB) 0.5-2.5 (3) MG/3ML nebulizer solution 3 mL (3 mLs Nebulization Given 03/18/18 2251)  albuterol (PROVENTIL) (2.5 MG/3ML) 0.083% nebulizer solution 2.5 mg (2.5 mg Nebulization Given 03/18/18 2251)     Initial Impression / Assessment and Plan / ED Course  I have reviewed the triage vital signs and the nursing notes.  Pertinent labs & imaging results that were available during my care of the patient were reviewed by me and considered in my medical decision making (see chart for details).     Patient with history of COPD presents to the emergency department with complaints of progressively worsening shortness of breath and cough.  Symptoms have really been present for 2 weeks but in the last 2 or 3 days his breathing has worsened as has his cough.  Patient administered multiple nebulizer treatments and Solu-Medrol.  He  continues to have wheezing, rhonchi present on examination.  His air movement, however, is adequate.  His oxygenation also remains adequate.  Chest x-ray reveals bilateral pneumonia.  Patient with elevated respiratory rate and leukocytosis consistent with 2 SIRS criteria, early sepsis.  CRITICAL CARE Performed by: Orpah Greek   Total critical care time: 30 minutes  Critical care time was exclusive of separately  billable procedures and treating other patients.  Critical care was necessary to treat or prevent imminent or life-threatening deterioration.  Critical care was time spent personally by me on the following activities: development of treatment plan with patient and/or surrogate as well as nursing, discussions with consultants, evaluation of patient's response to treatment, examination of patient, obtaining history from patient or surrogate, ordering and performing treatments and interventions, ordering and review of laboratory studies, ordering and review of radiographic studies, pulse oximetry and re-evaluation of patient's condition.   Final Clinical Impressions(s) / ED Diagnoses   Final diagnoses:  Community acquired pneumonia, unspecified laterality  Sepsis, due to unspecified organism [facility])    ED Discharge Orders    None       Orpah Greek, MD 03/18/18 2346

## 2018-03-18 NOTE — ED Triage Notes (Signed)
Pt reports sob and cough worsening over the past couple of day, reports some chest tightness r/t his breathing.  Pt states he has been using more of his breathing meds at home and using more oxygen.  Pt denies chest pain.

## 2018-03-19 ENCOUNTER — Other Ambulatory Visit: Payer: Self-pay

## 2018-03-19 DIAGNOSIS — F17210 Nicotine dependence, cigarettes, uncomplicated: Secondary | ICD-10-CM | POA: Diagnosis present

## 2018-03-19 DIAGNOSIS — E785 Hyperlipidemia, unspecified: Secondary | ICD-10-CM | POA: Diagnosis present

## 2018-03-19 DIAGNOSIS — R911 Solitary pulmonary nodule: Secondary | ICD-10-CM | POA: Diagnosis present

## 2018-03-19 DIAGNOSIS — Z7982 Long term (current) use of aspirin: Secondary | ICD-10-CM | POA: Diagnosis not present

## 2018-03-19 DIAGNOSIS — Z9981 Dependence on supplemental oxygen: Secondary | ICD-10-CM | POA: Diagnosis not present

## 2018-03-19 DIAGNOSIS — Z955 Presence of coronary angioplasty implant and graft: Secondary | ICD-10-CM | POA: Diagnosis not present

## 2018-03-19 DIAGNOSIS — R197 Diarrhea, unspecified: Secondary | ICD-10-CM | POA: Diagnosis present

## 2018-03-19 DIAGNOSIS — J189 Pneumonia, unspecified organism: Secondary | ICD-10-CM | POA: Diagnosis not present

## 2018-03-19 DIAGNOSIS — Z8249 Family history of ischemic heart disease and other diseases of the circulatory system: Secondary | ICD-10-CM | POA: Diagnosis not present

## 2018-03-19 DIAGNOSIS — J439 Emphysema, unspecified: Secondary | ICD-10-CM | POA: Diagnosis present

## 2018-03-19 DIAGNOSIS — Z885 Allergy status to narcotic agent status: Secondary | ICD-10-CM | POA: Diagnosis not present

## 2018-03-19 DIAGNOSIS — J9621 Acute and chronic respiratory failure with hypoxia: Secondary | ICD-10-CM | POA: Diagnosis not present

## 2018-03-19 DIAGNOSIS — Z681 Body mass index (BMI) 19 or less, adult: Secondary | ICD-10-CM | POA: Diagnosis not present

## 2018-03-19 DIAGNOSIS — I361 Nonrheumatic tricuspid (valve) insufficiency: Secondary | ICD-10-CM | POA: Diagnosis not present

## 2018-03-19 DIAGNOSIS — I10 Essential (primary) hypertension: Secondary | ICD-10-CM | POA: Diagnosis present

## 2018-03-19 DIAGNOSIS — A419 Sepsis, unspecified organism: Secondary | ICD-10-CM | POA: Diagnosis present

## 2018-03-19 DIAGNOSIS — I2510 Atherosclerotic heart disease of native coronary artery without angina pectoris: Secondary | ICD-10-CM | POA: Diagnosis present

## 2018-03-19 DIAGNOSIS — Z9852 Vasectomy status: Secondary | ICD-10-CM | POA: Diagnosis not present

## 2018-03-19 DIAGNOSIS — R011 Cardiac murmur, unspecified: Secondary | ICD-10-CM | POA: Diagnosis present

## 2018-03-19 DIAGNOSIS — F1010 Alcohol abuse, uncomplicated: Secondary | ICD-10-CM | POA: Diagnosis not present

## 2018-03-19 DIAGNOSIS — Z8673 Personal history of transient ischemic attack (TIA), and cerebral infarction without residual deficits: Secondary | ICD-10-CM | POA: Diagnosis not present

## 2018-03-19 DIAGNOSIS — K219 Gastro-esophageal reflux disease without esophagitis: Secondary | ICD-10-CM | POA: Diagnosis present

## 2018-03-19 DIAGNOSIS — Z7951 Long term (current) use of inhaled steroids: Secondary | ICD-10-CM | POA: Diagnosis not present

## 2018-03-19 DIAGNOSIS — J441 Chronic obstructive pulmonary disease with (acute) exacerbation: Secondary | ICD-10-CM | POA: Diagnosis not present

## 2018-03-19 DIAGNOSIS — E43 Unspecified severe protein-calorie malnutrition: Secondary | ICD-10-CM | POA: Diagnosis not present

## 2018-03-19 DIAGNOSIS — J181 Lobar pneumonia, unspecified organism: Secondary | ICD-10-CM | POA: Diagnosis present

## 2018-03-19 DIAGNOSIS — I252 Old myocardial infarction: Secondary | ICD-10-CM | POA: Diagnosis not present

## 2018-03-19 DIAGNOSIS — Z79899 Other long term (current) drug therapy: Secondary | ICD-10-CM | POA: Diagnosis not present

## 2018-03-19 LAB — CBC
HCT: 36.1 % — ABNORMAL LOW (ref 39.0–52.0)
Hemoglobin: 12.2 g/dL — ABNORMAL LOW (ref 13.0–17.0)
MCH: 33.5 pg (ref 26.0–34.0)
MCHC: 33.8 g/dL (ref 30.0–36.0)
MCV: 99.2 fL (ref 78.0–100.0)
PLATELETS: 280 10*3/uL (ref 150–400)
RBC: 3.64 MIL/uL — ABNORMAL LOW (ref 4.22–5.81)
RDW: 11.8 % (ref 11.5–15.5)
WBC: 17.3 10*3/uL — ABNORMAL HIGH (ref 4.0–10.5)

## 2018-03-19 LAB — CULTURE, BLOOD (ROUTINE X 2)
Culture: NO GROWTH
Special Requests: ADEQUATE

## 2018-03-19 LAB — COMPREHENSIVE METABOLIC PANEL
ALT: 9 U/L (ref 0–44)
AST: 18 U/L (ref 15–41)
Albumin: 3.1 g/dL — ABNORMAL LOW (ref 3.5–5.0)
Alkaline Phosphatase: 65 U/L (ref 38–126)
Anion gap: 8 (ref 5–15)
BUN: 6 mg/dL — AB (ref 8–23)
CHLORIDE: 92 mmol/L — AB (ref 98–111)
CO2: 32 mmol/L (ref 22–32)
CREATININE: 0.59 mg/dL — AB (ref 0.61–1.24)
Calcium: 8.1 mg/dL — ABNORMAL LOW (ref 8.9–10.3)
GFR calc Af Amer: >60 mL/min (ref >60)
GFR calc non Af Amer: >60 mL/min (ref >60)
GLUCOSE: 141 mg/dL — AB (ref 70–99)
Potassium: 3.7 mmol/L (ref 3.5–5.1)
SODIUM: 132 mmol/L — AB (ref 135–145)
Total Bilirubin: 0.6 mg/dL (ref 0.3–1.2)
Total Protein: 6.1 g/dL — ABNORMAL LOW (ref 6.5–8.1)

## 2018-03-19 LAB — I-STAT CG4 LACTIC ACID, ED: Lactic Acid, Venous: 1.73 mmol/L (ref 0.5–1.9)

## 2018-03-19 MED ORDER — FOLIC ACID 1 MG PO TABS
1.0000 mg | ORAL_TABLET | Freq: Every day | ORAL | Status: DC
  Administered 2018-03-19 – 2018-03-27 (×10): 1 mg via ORAL
  Filled 2018-03-19 (×10): qty 1

## 2018-03-19 MED ORDER — GUAIFENESIN ER 600 MG PO TB12
600.0000 mg | ORAL_TABLET | Freq: Two times a day (BID) | ORAL | Status: DC
  Administered 2018-03-19 – 2018-03-20 (×4): 600 mg via ORAL
  Filled 2018-03-19 (×4): qty 1

## 2018-03-19 MED ORDER — LISINOPRIL 10 MG PO TABS
40.0000 mg | ORAL_TABLET | Freq: Every day | ORAL | Status: DC
  Administered 2018-03-19 – 2018-03-27 (×9): 40 mg via ORAL
  Filled 2018-03-19 (×9): qty 4

## 2018-03-19 MED ORDER — LORAZEPAM 1 MG PO TABS: 1.0000 mg | ORAL_TABLET | Freq: Four times a day (QID) | ORAL | Status: AC | PRN

## 2018-03-19 MED ORDER — LORATADINE 10 MG PO TABS
10.0000 mg | ORAL_TABLET | Freq: Every day | ORAL | Status: DC
  Administered 2018-03-19 – 2018-03-27 (×9): 10 mg via ORAL
  Filled 2018-03-19 (×9): qty 1

## 2018-03-19 MED ORDER — IPRATROPIUM BROMIDE 0.02 % IN SOLN: 0.5000 mg | Freq: Four times a day (QID) | RESPIRATORY_TRACT | Status: DC

## 2018-03-19 MED ORDER — IPRATROPIUM-ALBUTEROL 0.5-2.5 (3) MG/3ML IN SOLN
3.0000 mL | Freq: Four times a day (QID) | RESPIRATORY_TRACT | Status: DC
  Administered 2018-03-19 – 2018-03-20 (×7): 3 mL via RESPIRATORY_TRACT
  Filled 2018-03-19 (×7): qty 3

## 2018-03-19 MED ORDER — VITAMIN B-1 100 MG PO TABS
100.0000 mg | ORAL_TABLET | Freq: Every day | ORAL | Status: DC
  Administered 2018-03-19 – 2018-03-27 (×9): 100 mg via ORAL
  Filled 2018-03-19 (×9): qty 1

## 2018-03-19 MED ORDER — SODIUM CHLORIDE 0.9 % IV SOLN: INTRAVENOUS | Status: DC

## 2018-03-19 MED ORDER — ASPIRIN EC 81 MG PO TBEC
81.0000 mg | DELAYED_RELEASE_TABLET | Freq: Every morning | ORAL | Status: DC
  Administered 2018-03-19 – 2018-03-27 (×9): 81 mg via ORAL
  Filled 2018-03-19 (×10): qty 1

## 2018-03-19 MED ORDER — AMLODIPINE BESYLATE 5 MG PO TABS
10.0000 mg | ORAL_TABLET | Freq: Every day | ORAL | Status: DC
  Administered 2018-03-19 – 2018-03-27 (×9): 10 mg via ORAL
  Filled 2018-03-19 (×9): qty 2

## 2018-03-19 MED ORDER — ACETAMINOPHEN 650 MG RE SUPP: 650.0000 mg | Freq: Four times a day (QID) | RECTAL | Status: DC | PRN

## 2018-03-19 MED ORDER — BUDESONIDE 0.5 MG/2ML IN SUSP
0.5000 mg | Freq: Two times a day (BID) | RESPIRATORY_TRACT | Status: DC
  Administered 2018-03-19 – 2018-03-27 (×17): 0.5 mg via RESPIRATORY_TRACT
  Filled 2018-03-19 (×17): qty 2

## 2018-03-19 MED ORDER — IPRATROPIUM-ALBUTEROL 0.5-2.5 (3) MG/3ML IN SOLN
2.5000 mg | Freq: Four times a day (QID) | RESPIRATORY_TRACT | Status: DC
  Administered 2018-03-19: 2.5 mg via RESPIRATORY_TRACT
  Filled 2018-03-19: qty 3

## 2018-03-19 MED ORDER — THIAMINE HCL 100 MG/ML IJ SOLN: 100.0000 mg | Freq: Every day | INTRAMUSCULAR | Status: DC

## 2018-03-19 MED ORDER — METHYLPREDNISOLONE SODIUM SUCC 40 MG IJ SOLR
40.0000 mg | Freq: Four times a day (QID) | INTRAMUSCULAR | Status: DC
  Administered 2018-03-19 – 2018-03-20 (×7): 40 mg via INTRAVENOUS
  Filled 2018-03-19 (×7): qty 1

## 2018-03-19 MED ORDER — SODIUM CHLORIDE 0.9 % IV SOLN: INTRAVENOUS | Status: DC | PRN

## 2018-03-19 MED ORDER — FLUTICASONE PROPIONATE 50 MCG/ACT NA SUSP
2.0000 | Freq: Every day | NASAL | Status: DC
  Administered 2018-03-19 – 2018-03-27 (×9): 2 via NASAL
  Filled 2018-03-19: qty 16

## 2018-03-19 MED ORDER — ENOXAPARIN SODIUM 40 MG/0.4ML ~~LOC~~ SOLN
40.0000 mg | SUBCUTANEOUS | Status: DC
  Administered 2018-03-19 – 2018-03-26 (×8): 40 mg via SUBCUTANEOUS
  Filled 2018-03-19 (×9): qty 0.4

## 2018-03-19 MED ORDER — ACETAMINOPHEN 325 MG PO TABS: 650.0000 mg | ORAL_TABLET | Freq: Four times a day (QID) | ORAL | Status: DC | PRN

## 2018-03-19 MED ORDER — ORAL CARE MOUTH RINSE
15.0000 mL | Freq: Two times a day (BID) | OROMUCOSAL | Status: DC
  Administered 2018-03-19 – 2018-03-27 (×15): 15 mL via OROMUCOSAL

## 2018-03-19 MED ORDER — METOPROLOL TARTRATE 50 MG PO TABS
100.0000 mg | ORAL_TABLET | Freq: Two times a day (BID) | ORAL | Status: DC
  Administered 2018-03-19 – 2018-03-27 (×17): 100 mg via ORAL
  Filled 2018-03-19 (×17): qty 2

## 2018-03-19 MED ORDER — SODIUM CHLORIDE 0.9 % IV SOLN
1.0000 g | INTRAVENOUS | Status: DC
  Administered 2018-03-19: 1 g via INTRAVENOUS
  Filled 2018-03-19 (×2): qty 10
  Filled 2018-03-19: qty 1

## 2018-03-19 MED ORDER — ROSUVASTATIN CALCIUM 20 MG PO TABS
40.0000 mg | ORAL_TABLET | Freq: Every day | ORAL | Status: DC
  Administered 2018-03-19 – 2018-03-26 (×8): 40 mg via ORAL
  Filled 2018-03-19 (×9): qty 2

## 2018-03-19 MED ORDER — LORAZEPAM 2 MG/ML IJ SOLN: 1.0000 mg | Freq: Four times a day (QID) | INTRAMUSCULAR | Status: AC | PRN

## 2018-03-19 MED ORDER — ADULT MULTIVITAMIN W/MINERALS CH
1.0000 | ORAL_TABLET | Freq: Every day | ORAL | Status: DC
  Administered 2018-03-19 – 2018-03-27 (×9): 1 via ORAL
  Filled 2018-03-19 (×9): qty 1

## 2018-03-19 MED ORDER — SODIUM CHLORIDE 0.9 % IV SOLN
500.0000 mg | INTRAVENOUS | Status: DC
  Administered 2018-03-19: 500 mg via INTRAVENOUS
  Filled 2018-03-19 (×3): qty 500

## 2018-03-19 MED ORDER — MIRTAZAPINE 15 MG PO TABS
7.5000 mg | ORAL_TABLET | Freq: Every morning | ORAL | Status: DC
  Administered 2018-03-19 – 2018-03-27 (×9): 7.5 mg via ORAL
  Filled 2018-03-19 (×9): qty 1

## 2018-03-19 MED ORDER — PANTOPRAZOLE SODIUM 40 MG PO TBEC
40.0000 mg | DELAYED_RELEASE_TABLET | Freq: Every morning | ORAL | Status: DC
  Administered 2018-03-19 – 2018-03-27 (×9): 40 mg via ORAL
  Filled 2018-03-19 (×9): qty 1

## 2018-03-19 NOTE — Plan of Care (Signed)
progressing 

## 2018-03-19 NOTE — Progress Notes (Signed)
Patient seen and examined.  Admitted after midnight secondary to acute on chronic respiratory failure in the setting of COPD exacerbation and community-acquired pneumonia.  Patient continued to be short of breath, unable to speak in full sentences and requiring 3-4 L nasal cannula oxygen supplementation to maintain O2 sats above 90%.  Continue IV steroids, antibiotics, Mucinex, flutter valve, duo nebs and supportive care.   Please Refer to H&P written by Dr. Darrick Meigs for further info/details on admission.  Barton Dubois MD [phone]

## 2018-03-19 NOTE — ED Notes (Signed)
Hospital Provider at bedside

## 2018-03-19 NOTE — Care Management Important Message (Signed)
Important Message  Patient Details  Name: Vincent Gilbert MRN: [id number] Date of Birth: 09-07-1946   Medicare Important Message Given:  Yes    [name] 03/19/2018, 11:45 AM

## 2018-03-19 NOTE — H&P (Signed)
TRH H&P    Patient Demographics:    Vincent Gilbert, is a 72 y.o. male  MRN: [id number]  DOB - 1946-08-30  Admit Date - 03/18/2018  Referring MD/NP/PA: Dr Betsey Holiday  Outpatient Primary MD for the patient is Glenda Chroman, MD  Patient coming from: Home   Chief complaint-shortness of breath   HPI:    Vincent Gilbert  is a 72 y.o. male, with history of hypertension, MI, stroke, chronic respiratory failure on 2 L oxygen at home came to hospital with worsening shortness of breath for past 2 days.  Patient says that he has been coughing but unable to cough up any phlegm.  He denies fever or chills.  Denies nausea vomiting or diarrhea.  Patient was given Solu-Medrol by EMS in route, chest x-ray in the ED shows emphysematous hyperinflation of lungs with bibasilar airspace disease compatible with pneumonia and/atelectasis. Patient started on ceftriaxone and Zithromax. He denies dysuria urgency or frequency of urination. No headache or passing out. No abdominal pain.   Review of systems:      All other systems reviewed and are negative.   With Past History of the following :    Past Medical History:  Diagnosis Date  . Carotid artery occlusion   . COPD (chronic obstructive pulmonary disease) ([place])   . Coronary artery disease   . GERD (gastroesophageal reflux disease)   . Heart murmur   . Hypertension   . Myocardial infarction ([place]) 12/12  . Stroke [facility]) 09/10/2011      Past Surgical History:  Procedure Laterality Date  . CARDIAC CATHETERIZATION  09-15-2011   90% mid RCA stenosis. Normal EF  . CORONARY ANGIOPLASTY WITH STENT PLACEMENT  09-15-2011   mid RCA: 3.5 X32 mm Promus DES  . ENDARTERECTOMY  03/11/2012   Procedure: ENDARTERECTOMY CAROTID;  Surgeon: Mal Misty, MD;  Location: [place];  Service: Vascular;  Laterality: Right;  . EYE SURGERY  rt  . LEFT HEART CATHETERIZATION WITH CORONARY  ANGIOGRAM N/A 09/15/2011   Procedure: LEFT HEART CATHETERIZATION WITH CORONARY ANGIOGRAM;  Surgeon: Sherren Mocha, MD;  Location: [facility] CATH LAB;  Service: Cardiovascular;  Laterality: N/A;  . PERCUTANEOUS CORONARY STENT INTERVENTION (PCI-S) N/A 09/15/2011   Procedure: PERCUTANEOUS CORONARY STENT INTERVENTION (PCI-S);  Surgeon: Sherren Mocha, MD;  Location: [facility] CATH LAB;  Service: Cardiovascular;  Laterality: N/A;  . VASECTOMY    . YAG LASER APPLICATION Right 4/46/2863   Procedure: YAG LASER APPLICATION;  Surgeon: Williams Che, MD;  Location: [facility];  Service: Ophthalmology;  Laterality: Right;      Social History:      Social History   Tobacco Use  . Smoking status: Current Every Day Smoker    Packs/day: 0.75    Years: 40.00    Pack years: 30.00    Types: Cigarettes    Start date: 02/26/1956  . Smokeless tobacco: Never Used  . Tobacco comment: 1/2 pk per day  Substance Use Topics  . Alcohol use: Yes    Alcohol/week: 25.2 oz    Types: 42 Cans  of beer per week    Comment: 2-3x/ wk       Family History :     Family History  Problem Relation Age of Onset  . Heart disease Mother   . Heart disease Father       Home Medications:   Prior to Admission medications   Medication Sig Start Date End Date Taking? Authorizing Provider  albuterol (PROVENTIL HFA;VENTOLIN HFA) 108 (90 Base) MCG/ACT inhaler Inhale 2 puffs into the lungs every 6 (six) hours as needed for wheezing or shortness of breath.   Yes [provider]  amLODipine (NORVASC) 10 MG tablet Take 10 mg by mouth daily.   Yes [provider]  aspirin EC 81 MG tablet Take 81 mg by mouth every morning.    Yes [provider]  budesonide-formoterol (SYMBICORT) 80-4.5 MCG/ACT inhaler Inhale 2 puffs into the lungs 2 (two) times daily.   Yes [provider]  cetirizine (ZYRTEC) 10 MG tablet Take 10 mg by mouth daily.   Yes [provider]  fluticasone (FLONASE) 50 MCG/ACT nasal  spray Place 2 sprays into both nostrils daily. 11/15/14  Yes [provider]  lisinopril (PRINIVIL,ZESTRIL) 40 MG tablet Take 1 tablet (40 mg total) by mouth daily. 05/04/15  Yes Branch, Alphonse Guild, MD  metoprolol (LOPRESSOR) 100 MG tablet TAKE 1 TABLET (100 MG TOTAL) BY MOUTH 2 (TWO) TIMES DAILY. 07/28/16  Yes BranchAlphonse Guild, MD  mirtazapine (REMERON) 7.5 MG tablet every morning. 03/08/18  Yes [provider]  Multiple Vitamin (MULTIVITAMIN) tablet Take 1 tablet by mouth daily.   Yes [provider]  oral electrolytes (THERMOTABS) TABS tablet Take 1 tablet by mouth daily.   Yes [provider]  pantoprazole (PROTONIX) 40 MG tablet Take 40 mg by mouth every morning.    Yes [provider]  Potassium 99 MG TABS Take 1 tablet by mouth daily.   Yes [provider]  rosuvastatin (CRESTOR) 40 MG tablet Take 40 mg by mouth daily after supper.   Yes [provider]  tiotropium (SPIRIVA) 18 MCG inhalation capsule Place 18 mcg into inhaler and inhale daily.   Yes [provider]     Allergies:     Allergies  Allergen Reactions  . Oxycodone     nausea  . Codeine Palpitations     Physical Exam:   Vitals  Blood pressure (!) 142/76, pulse 87, temperature 98.7 F (37.1 C), temperature source Oral, resp. rate (!) 24, height 5\' 9"  (1.753 m), weight 52.2 kg (115 lb), SpO2 99 %.  1.  General: Appears in no acute distress  2. Psychiatric:  Intact judgement and  insight, awake alert, oriented x 3.  3. Neurologic: No focal neurological deficits, all cranial nerves intact.Strength 5/5 all 4 extremities, sensation intact all 4 extremities, plantars down going.  4. Eyes :  anicteric sclerae, moist conjunctivae with no lid lag. PERRLA.  5. ENMT:  Oropharynx clear with moist mucous membranes and good dentition  6. Neck:  supple, no cervical lymphadenopathy appriciated, No thyromegaly  7. Respiratory : Decreased breath  sounds at lung bases,mild tachypnea  8. Cardiovascular : RRR, no gallops, rubs or murmurs, no leg edema  9. Gastrointestinal:  Positive bowel sounds, abdomen soft, non-tender to palpation,no hepatosplenomegaly, no rigidity or guarding       10. Skin:  No cyanosis, normal texture and turgor, no rash, lesions or ulcers  11.Musculoskeletal:  Good muscle tone,  joints appear normal , no effusions,  normal range of motion    Data Review:    CBC Recent Labs  Lab 03/18/18 2310  WBC 16.2*  HGB 14.2  HCT 41.3  PLT 314  MCV 98.6  MCH 33.9  MCHC 34.4  RDW 11.8   ------------------------------------------------------------------------------------------------------------------  Chemistries  Recent Labs  Lab 03/18/18 2310  NA 127*  K 3.6  CL 86*  CO2 33*  GLUCOSE 119*  BUN 6*  CREATININE 0.52*  CALCIUM 8.9   ------------------------------------------------------------------------------------------------------------------  ------------------------------------------------------------------------------------------------------------------ GFR: Estimated Creatinine Clearance: 63.4 mL/min (A) (by C-G formula based on SCr of 0.52 mg/dL (L)). Liver Function Tests: No results for input(s): AST, ALT, ALKPHOS, BILITOT, PROT, ALBUMIN in the last 168 hours. No results for input(s): LIPASE, AMYLASE in the last 168 hours. No results for input(s): AMMONIA in the last 168 hours. Coagulation Profile: No results for input(s): INR, PROTIME in the last 168 hours. Cardiac Enzymes: No results for input(s): CKTOTAL, CKMB, CKMBINDEX, TROPONINI in the last 168 hours. BNP (last 3 results) No results for input(s): PROBNP in the last 8760 hours. HbA1C: No results for input(s): HGBA1C in the last 72 hours. CBG: No results for input(s): GLUCAP in the last 168 hours. Lipid Profile: No results for input(s): CHOL, HDL, LDLCALC, TRIG, CHOLHDL, LDLDIRECT in the last 72 hours. Thyroid Function  Tests: No results for input(s): TSH, T4TOTAL, FREET4, T3FREE, THYROIDAB in the last 72 hours. Anemia Panel: No results for input(s): VITAMINB12, FOLATE, FERRITIN, TIBC, IRON, RETICCTPCT in the last 72 hours.  --------------------------------------------------------------------------------------------------------------- Urine analysis:    Component Value Date/Time   COLORURINE YELLOW 03/10/2012 1312   APPEARANCEUR CLEAR 03/10/2012 1312   LABSPEC 1.017 03/10/2012 1312   PHURINE 6.5 03/10/2012 1312   GLUCOSEU NEGATIVE 03/10/2012 1312   HGBUR NEGATIVE 03/10/2012 1312   BILIRUBINUR NEGATIVE 03/10/2012 1312   KETONESUR NEGATIVE 03/10/2012 1312   PROTEINUR 100 (A) 03/10/2012 1312   UROBILINOGEN 1.0 03/10/2012 1312   NITRITE NEGATIVE 03/10/2012 1312   LEUKOCYTESUR NEGATIVE 03/10/2012 1312      Imaging Results:    Dg Chest Portable 1 View  Result Date: 03/18/2018 CLINICAL DATA:  Dyspnea and chest tightness with cough over the past few days. EXAM: PORTABLE CHEST 1 VIEW COMPARISON:  PET CT 06/02/2017, CXR 04/28/2017 FINDINGS: Emphysematous hyperinflation of the lungs with bibasilar atelectasis. Superimposed small foci of pneumonia is not entirely excluded. Ovoid nodule projecting over the left upper lobe is stable estimated at 19 x 11 mm radiographically versus 17 x 11 mm prior PET-CT. Heart size is within normal limits. Aortic atherosclerosis without aneurysmal dilatation is seen. IMPRESSION: 1. Emphysematous hyperinflation of the lungs with bibasilar airspace disease compatible with pneumonia and/or atelectasis. 2. Stable ovoid nodule in the left upper lobe measuring 19 x 11 mm. 3. Aortic atherosclerosis. Electronically Signed   By: Ashley Royalty M.D.   On: 03/18/2018 23:31    My personal review of EKG: Rhythm NSR, T wave inversions in lead V1 V2 unchanged from previous EKG from August 2018   Assessment & Plan:    Active Problems:   Hypertension   Alcohol abuse   CAP (community acquired  pneumonia)   1. Community-acquired pneumonia-blood cultures x2 have been obtained, will obtain urinary strep pneumo antigen.  Continue ceftriaxone and Zithromax. 2. COPD exacerbation-patient also presented with COPD exacerbation, will start Solu-Medrol 40 mg IV every 6 hours, DuoNeb every 6 hours.  Flutter valve every 4 hours while awake.  Mucinex 1 tablet p.o. twice daily. 3. Hypertension-blood pressure is mildly elevated, continue Norvasc, lisinopril,  metoprolol 4. CAD-stable continue aspirin, metoprolol 5. History of stroke-stable,  no new deficit. 6. Alcohol abuse-patient drinks 6 packs of beer every day, will start CIWA protocol.  No signs and symptoms of alcohol withdrawal at this time.   DVT Prophylaxis-   Lovenox   AM Labs Ordered, also please review Full Orders  Family Communication: Admission, patients condition and plan of care including tests being ordered have been discussed with the patient and his spouse at bedside* who indicate understanding and agree with the plan and Code Status.  Code Status:  Full code  Admission status: Inpatient  Time spent in minutes : 60 min   Oswald Hillock M.D on 03/19/2018 at 1:47 AM  Between 7am to 7pm - Pager - [phone]. After 7pm go to [email or web address] - password [facility]  [facility] - Office  [phone]

## 2018-03-20 LAB — BASIC METABOLIC PANEL
ANION GAP: 6 (ref 5–15)
BUN: 11 mg/dL (ref 8–23)
CALCIUM: 8.2 mg/dL — AB (ref 8.9–10.3)
CO2: 35 mmol/L — AB (ref 22–32)
Chloride: 92 mmol/L — ABNORMAL LOW (ref 98–111)
Creatinine, Ser: 0.62 mg/dL (ref 0.61–1.24)
GLUCOSE: 164 mg/dL — AB (ref 70–99)
Potassium: 3.9 mmol/L (ref 3.5–5.1)
Sodium: 133 mmol/L — ABNORMAL LOW (ref 135–145)

## 2018-03-20 LAB — CBC
HCT: 34.2 % — ABNORMAL LOW (ref 39.0–52.0)
Hemoglobin: 11.3 g/dL — ABNORMAL LOW (ref 13.0–17.0)
MCH: 33.2 pg (ref 26.0–34.0)
MCHC: 33 g/dL (ref 30.0–36.0)
MCV: 100.6 fL — AB (ref 78.0–100.0)
PLATELETS: 303 10*3/uL (ref 150–400)
RBC: 3.4 MIL/uL — ABNORMAL LOW (ref 4.22–5.81)
RDW: 12.3 % (ref 11.5–15.5)
WBC: 24.8 10*3/uL — AB (ref 4.0–10.5)

## 2018-03-20 LAB — HIV ANTIBODY (ROUTINE TESTING W REFLEX): HIV SCREEN 4TH GENERATION: NONREACTIVE

## 2018-03-20 MED ORDER — AMOXICILLIN-POT CLAVULANATE 875-125 MG PO TABS
1.0000 | ORAL_TABLET | Freq: Two times a day (BID) | ORAL | Status: AC
  Administered 2018-03-20 – 2018-03-26 (×13): 1 via ORAL
  Filled 2018-03-20 (×13): qty 1

## 2018-03-20 MED ORDER — GUAIFENESIN ER 600 MG PO TB12
1200.0000 mg | ORAL_TABLET | Freq: Two times a day (BID) | ORAL | Status: DC
  Administered 2018-03-20 – 2018-03-27 (×14): 1200 mg via ORAL
  Filled 2018-03-20 (×16): qty 2

## 2018-03-20 MED ORDER — IPRATROPIUM-ALBUTEROL 0.5-2.5 (3) MG/3ML IN SOLN
3.0000 mL | Freq: Four times a day (QID) | RESPIRATORY_TRACT | Status: DC
  Administered 2018-03-21 – 2018-03-23 (×10): 3 mL via RESPIRATORY_TRACT
  Filled 2018-03-20 (×10): qty 3

## 2018-03-20 MED ORDER — METHYLPREDNISOLONE SODIUM SUCC 125 MG IJ SOLR
60.0000 mg | Freq: Three times a day (TID) | INTRAMUSCULAR | Status: DC
  Administered 2018-03-20 – 2018-03-23 (×10): 60 mg via INTRAVENOUS
  Administered 2018-03-24: 21:00:00 via INTRAVENOUS
  Administered 2018-03-24 – 2018-03-27 (×9): 60 mg via INTRAVENOUS
  Filled 2018-03-20 (×21): qty 2

## 2018-03-20 MED ORDER — DIPHENHYDRAMINE HCL 25 MG PO CAPS
25.0000 mg | ORAL_CAPSULE | Freq: Every evening | ORAL | Status: DC | PRN
  Administered 2018-03-20 – 2018-03-24 (×4): 25 mg via ORAL
  Filled 2018-03-20 (×4): qty 1

## 2018-03-20 NOTE — Progress Notes (Signed)
PROGRESS NOTE    Vincent Gilbert  FBP:[id number] DOB: Aug 03, 1946 DOA: 03/18/2018 PCP: Glenda Chroman, MD    Brief Narrative:  72 y.o. male, with history of hypertension, MI, stroke, chronic respiratory failure on 2 L oxygen at home came to hospital with worsening shortness of breath for past 2 days.  Patient says that he has been coughing but unable to cough up any phlegm.  He denies fever or chills. Denies nausea vomiting or diarrhea.  CXR demonstrated emphysematous changes and acute bibasilar airsapce disease. Admitted for further management of COPD exacerbation and PNA.  Assessment & Plan: 1-acute on chronic respiratory failure: In the setting of COPD exacerbation and community-acquired pneumonia. -Patient has remained afebrile -Slowly improving -Still unable to speak in full sentences, short of breath with minimal exertion and requiring 4 L nasal cannula oxygen supplementation. -will continue steroids, antibiotics, nebulizer treatment, Mucinex and supportive care. -Follow clinical response.  2-hypertension -Stable and well-controlled -Continue Norvasc, lopressor and lisinopril.  3-HLD -Continue statins  4-GERD -continue PPI  5-alcohol abuse -cessation counseling provided  -continue CIWA protocol  -no withdrawal   DVT prophylaxis: lovenox  Code Status: Full code Family Communication: Side. Disposition Plan: Remains inpatient, continue steroids, continue antibiotics (will switch to oral regimen), continue oxygen supplementation/wean down as tolerated.  Continue Pulmicort, continue nebulizer treatment.  Consultants:   None   Procedures:   See below for x-ray reports.  Antimicrobials:  Anti-infectives (From admission, onward)   Start     Dose/Rate Route Frequency Ordered Stop   03/20/18 2200  amoxicillin-clavulanate (AUGMENTIN) 875-125 MG per tablet 1 tablet     1 tablet Oral Every 12 hours 03/20/18 1621     03/19/18 2200  cefTRIAXone (ROCEPHIN) 1 g in sodium  chloride 0.9 % 100 mL IVPB  Status:  Discontinued     1 g 200 mL/hr over 30 Minutes Intravenous Every 24 hours 03/19/18 0232 03/20/18 1621   03/19/18 2200  azithromycin (ZITHROMAX) 500 mg in sodium chloride 0.9 % 250 mL IVPB  Status:  Discontinued     500 mg 250 mL/hr over 60 Minutes Intravenous Every 24 hours 03/19/18 0232 03/20/18 1621   03/18/18 2345  cefTRIAXone (ROCEPHIN) 1 g in sodium chloride 0.9 % 100 mL IVPB     1 g 200 mL/hr over 30 Minutes Intravenous  Once 03/18/18 2342 03/19/18 0018   03/18/18 2345  azithromycin (ZITHROMAX) 500 mg in sodium chloride 0.9 % 250 mL IVPB     500 mg 250 mL/hr over 60 Minutes Intravenous  Once 03/18/18 2342 03/19/18 0131     Subjective: No fever, no chest pain, no nausea, no vomiting.  Still using 3-4 L nasal cannula oxygen supplementation.  Having difficulty speaking in full sentences and feeling short of breath with minimal exertion.  Positive productive cough.  Objective: Vitals:   03/20/18 0529 03/20/18 0759 03/20/18 1339 03/20/18 1503  BP: 117/82   (!) 135/55  Pulse: 89   94  Resp: 18   19  Temp: 98.4 F (36.9 C)     TempSrc: Oral     SpO2: 91% 94% 98% 99%  Weight:      Height:        Intake/Output Summary (Last 24 hours) at 03/20/2018 1629 Last data filed at 03/20/2018 1000 Gross per 24 hour  Intake 950 ml  Output 1300 ml  Net -350 ml   Filed Weights   03/18/18 2248  Weight: 52.2 kg (115 lb)    Examination: General exam: Alert,  awake, oriented x 3; short of breath, unable to speak in full sentences and with diffuse wheezing on examination.  Still requiring 3-4 L nasal cannula oxygen supplementation. Respiratory system: Wheezing, positive rhonchi throughout his lung fields; positive tachypnea with minimal exertion, and accessory muscles.  Cardiovascular system:RRR. No rubs or gallops.  No JVD. Gastrointestinal system: Abdomen is nondistended, soft and nontender. No organomegaly or masses felt. Normal bowel sounds  heard. Central nervous system: Alert and oriented. No focal neurological deficits. Extremities: No cyanosis or clubbing; FROM Skin: No rashes, lesions or ulcers Psychiatry: Judgement and insight appear normal. Mood & affect appropriate.    Data Reviewed: I have personally reviewed following labs and imaging studies  CBC: Recent Labs  Lab 03/18/18 2310 03/19/18 0507 03/20/18 0632  WBC 16.2* 17.3* 24.8*  HGB 14.2 12.2* 11.3*  HCT 41.3 36.1* 34.2*  MCV 98.6 99.2 100.6*  PLT 314 280 831   Basic Metabolic Panel: Recent Labs  Lab 03/18/18 2310 03/19/18 0507 03/20/18 0632  NA 127* 132* 133*  K 3.6 3.7 3.9  CL 86* 92* 92*  CO2 33* 32 35*  GLUCOSE 119* 141* 164*  BUN 6* 6* 11  CREATININE 0.52* 0.59* 0.62  CALCIUM 8.9 8.1* 8.2*   GFR: Estimated Creatinine Clearance: 63.4 mL/min (by C-G formula based on SCr of 0.62 mg/dL).   Liver Function Tests: Recent Labs  Lab 03/19/18 0507  AST 18  ALT 9  ALKPHOS 65  BILITOT 0.6  PROT 6.1*  ALBUMIN 3.1*   Urine analysis:    Component Value Date/Time   COLORURINE YELLOW 03/10/2012 [place] 03/10/2012 1312   LABSPEC 1.017 03/10/2012 1312   PHURINE 6.5 03/10/2012 1312   GLUCOSEU NEGATIVE 03/10/2012 1312   HGBUR NEGATIVE 03/10/2012 1312   BILIRUBINUR NEGATIVE 03/10/2012 1312   KETONESUR NEGATIVE 03/10/2012 1312   PROTEINUR 100 (A) 03/10/2012 1312   UROBILINOGEN 1.0 03/10/2012 1312   NITRITE NEGATIVE 03/10/2012 1312   LEUKOCYTESUR NEGATIVE 03/10/2012 1312    Recent Results (from the past 240 hour(s))  Culture, blood (Routine X 2) w Reflex to ID Panel     Status: None (Preliminary result)   Collection Time: 03/18/18 11:55 PM  Result Value Ref Range Status   Specimen Description BLOOD LEFT ARM  Final   Special Requests   Final    BOTTLES DRAWN AEROBIC AND ANAEROBIC Blood Culture adequate volume   Culture   Final    NO GROWTH 1 DAY Performed at [facility], [address], [place], [place] [address]     Report Status PENDING  Incomplete  Culture, blood (Routine X 2) w Reflex to ID Panel     Status: None   Collection Time: 03/19/18 12:04 AM  Result Value Ref Range Status   Specimen Description BLOOD LEFT ARM  Final   Special Requests   Final    BOTTLES DRAWN AEROBIC AND ANAEROBIC Blood Culture adequate volume   Culture   Final    NO GROWTH 84 DAYS Performed at [facility], [address], [place], [place] [address]    Report Status 03/19/2018 FINAL  Final     Radiology Studies: Dg Chest Portable 1 View  Result Date: 03/18/2018 CLINICAL DATA:  Dyspnea and chest tightness with cough over the past few days. EXAM: PORTABLE CHEST 1 VIEW COMPARISON:  PET CT 06/02/2017, CXR 04/28/2017 FINDINGS: Emphysematous hyperinflation of the lungs with bibasilar atelectasis. Superimposed small foci of pneumonia is not entirely excluded. Ovoid nodule projecting over the left upper  lobe is stable estimated at 19 x 11 mm radiographically versus 17 x 11 mm prior PET-CT. Heart size is within normal limits. Aortic atherosclerosis without aneurysmal dilatation is seen. IMPRESSION: 1. Emphysematous hyperinflation of the lungs with bibasilar airspace disease compatible with pneumonia and/or atelectasis. 2. Stable ovoid nodule in the left upper lobe measuring 19 x 11 mm. 3. Aortic atherosclerosis. Electronically Signed   By: Ashley Royalty M.D.   On: 03/18/2018 23:31    Scheduled Meds: . amLODipine  10 mg Oral Daily  . amoxicillin-clavulanate  1 tablet Oral Q12H  . aspirin EC  81 mg Oral q morning - 10a  . budesonide (PULMICORT) nebulizer solution  0.5 mg Nebulization BID  . enoxaparin (LOVENOX) injection  40 mg Subcutaneous Q24H  . fluticasone  2 spray Each Nare Daily  . folic acid  1 mg Oral Daily  . guaiFENesin  1,200 mg Oral BID  . ipratropium-albuterol  3 mL Nebulization Q6H  . lisinopril  40 mg Oral Daily  . loratadine  10 mg Oral Daily  . mouth rinse  15 mL Mouth Rinse BID  . methylPREDNISolone (SOLU-MEDROL)  injection  60 mg Intravenous Q8H  . metoprolol tartrate  100 mg Oral BID  . mirtazapine  7.5 mg Oral q morning - 10a  . multivitamin with minerals  1 tablet Oral Daily  . pantoprazole  40 mg Oral q morning - 10a  . rosuvastatin  40 mg Oral QPC supper  . thiamine  100 mg Oral Daily   Or  . thiamine  100 mg Intravenous Daily   Continuous Infusions: . sodium chloride       LOS: 1 day    Time spent: 30 minutes    Barton Dubois, MD [facility] Pager [phone]  If 7PM-7AM, please contact night-coverage [email or web address] Password [facility] 03/20/2018, 4:29 PM

## 2018-03-21 MED ORDER — TRAZODONE HCL 50 MG PO TABS
50.0000 mg | ORAL_TABLET | Freq: Once | ORAL | Status: AC
  Administered 2018-03-21: 50 mg via ORAL
  Filled 2018-03-21: qty 1

## 2018-03-21 NOTE — Progress Notes (Signed)
PROGRESS NOTE    Vincent Gilbert  JSE:[id number] DOB: 03/14/1946 DOA: 03/18/2018 PCP: Glenda Chroman, MD    Brief Narrative:  72 y.o. male, with history of hypertension, MI, stroke, chronic respiratory failure on 2 L oxygen at home came to hospital with worsening shortness of breath for past 2 days.  Patient says that he has been coughing but unable to cough up any phlegm.  He denies fever or chills. Denies nausea vomiting or diarrhea.  CXR demonstrated emphysematous changes and acute bibasilar airsapce disease. Admitted for further management of COPD exacerbation and PNA.  Assessment & Plan: 1-acute on chronic respiratory failure with hypoxia: In the setting of COPD exacerbation and community-acquired pneumonia. -Patient has remained afebrile -Slowly improving -Still with difficulties trying to speak in full sentences and is short of breath with minimal exertion. -continue weaning oxygen as tolerated. -will continue steroids, antibiotics, nebulizer treatment, Mucinex (dose adjusted to 1000mg  BID). -continue supportive care and follow clinical response.  2-hypertension -Stable and well-controlled -Continue Norvasc, lopressor and lisinopril.  3-HLD -Continue statins  4-GERD -continue PPI  5-alcohol abuse -cessation counseling provided  -continue CIWA protocol  -no withdrawal symptoms appreciated.   DVT prophylaxis: lovenox  Code Status: Full code Family Communication: Side. Disposition Plan: Remains inpatient, continue steroids, continue oral antibiotics. Continue oxygen supplementation/wean down as tolerated to baseline.  Continue Pulmicort, continue nebulizer treatment. Continue mucinex   Consultants:   None   Procedures:   See below for x-ray reports.  Antimicrobials:  Anti-infectives (From admission, onward)   Start     Dose/Rate Route Frequency Ordered Stop   03/20/18 2200  amoxicillin-clavulanate (AUGMENTIN) 875-125 MG per tablet 1 tablet     1 tablet Oral  Every 12 hours 03/20/18 1621     03/19/18 2200  cefTRIAXone (ROCEPHIN) 1 g in sodium chloride 0.9 % 100 mL IVPB  Status:  Discontinued     1 g 200 mL/hr over 30 Minutes Intravenous Every 24 hours 03/19/18 0232 03/20/18 1621   03/19/18 2200  azithromycin (ZITHROMAX) 500 mg in sodium chloride 0.9 % 250 mL IVPB  Status:  Discontinued     500 mg 250 mL/hr over 60 Minutes Intravenous Every 24 hours 03/19/18 0232 03/20/18 1621   03/18/18 2345  cefTRIAXone (ROCEPHIN) 1 g in sodium chloride 0.9 % 100 mL IVPB     1 g 200 mL/hr over 30 Minutes Intravenous  Once 03/18/18 2342 03/19/18 0018   03/18/18 2345  azithromycin (ZITHROMAX) 500 mg in sodium chloride 0.9 % 250 mL IVPB     500 mg 250 mL/hr over 60 Minutes Intravenous  Once 03/18/18 2342 03/19/18 0131     Subjective: No fever, no CP. Still with SOB, productive cough and diffuse wheezing. Unable to speak in full sentences.  Objective: Vitals:   03/21/18 0630 03/21/18 1240 03/21/18 1351 03/21/18 1446  BP: (!) 130/59 (!) 118/48  (!) 116/53  Pulse: 73 78  73  Resp: 20   16  Temp: 98.3 F (36.8 C)   98.5 F (36.9 C)  TempSrc: Oral   Oral  SpO2: 100%  97% 98%  Weight:      Height:        Intake/Output Summary (Last 24 hours) at 03/21/2018 1455 Last data filed at 03/21/2018 0810 Gross per 24 hour  Intake 480 ml  Output 1300 ml  Net -820 ml   Filed Weights   03/18/18 2248  Weight: 52.2 kg (115 lb)    Examination: General exam: Alert, awake, oriented  x 3; breathing slowly improving. Still with mild difficulty speaking in full sentences. No CP. Complaining of productive cough. Respiratory system: positive expiratory wheezing, no using accessory muscles; requiring 3-4 [place] oxygen supplementation and with diffuse rhonchi.  Cardiovascular system:RRR. No murmurs, rubs, gallops. Gastrointestinal system: Abdomen is nondistended, soft and nontender. No organomegaly or masses felt. Normal bowel sounds heard. Central nervous system: Alert and  oriented. No focal neurological deficits. Extremities: No C/C/E, +pedal pulses Skin: No rashes, lesions or ulcers Psychiatry: Judgement and insight appear normal. Mood & affect appropriate.   Data Reviewed: I have personally reviewed following labs and imaging studies  CBC: Recent Labs  Lab 03/18/18 2310 03/19/18 0507 03/20/18 0632  WBC 16.2* 17.3* 24.8*  HGB 14.2 12.2* 11.3*  HCT 41.3 36.1* 34.2*  MCV 98.6 99.2 100.6*  PLT 314 280 454   Basic Metabolic Panel: Recent Labs  Lab 03/18/18 2310 03/19/18 0507 03/20/18 0632  NA 127* 132* 133*  K 3.6 3.7 3.9  CL 86* 92* 92*  CO2 33* 32 35*  GLUCOSE 119* 141* 164*  BUN 6* 6* 11  CREATININE 0.52* 0.59* 0.62  CALCIUM 8.9 8.1* 8.2*   GFR: Estimated Creatinine Clearance: 63.4 mL/min (by C-G formula based on SCr of 0.62 mg/dL).   Liver Function Tests: Recent Labs  Lab 03/19/18 0507  AST 18  ALT 9  ALKPHOS 65  BILITOT 0.6  PROT 6.1*  ALBUMIN 3.1*   Urine analysis:    Component Value Date/Time   COLORURINE YELLOW 03/10/2012 [place] 03/10/2012 1312   LABSPEC 1.017 03/10/2012 1312   PHURINE 6.5 03/10/2012 1312   GLUCOSEU NEGATIVE 03/10/2012 1312   HGBUR NEGATIVE 03/10/2012 1312   BILIRUBINUR NEGATIVE 03/10/2012 1312   KETONESUR NEGATIVE 03/10/2012 1312   PROTEINUR 100 (A) 03/10/2012 1312   UROBILINOGEN 1.0 03/10/2012 1312   NITRITE NEGATIVE 03/10/2012 1312   LEUKOCYTESUR NEGATIVE 03/10/2012 1312    Recent Results (from the past 240 hour(s))  Culture, blood (Routine X 2) w Reflex to ID Panel     Status: None (Preliminary result)   Collection Time: 03/18/18 11:55 PM  Result Value Ref Range Status   Specimen Description BLOOD LEFT ARM  Final   Special Requests   Final    BOTTLES DRAWN AEROBIC AND ANAEROBIC Blood Culture adequate volume   Culture   Final    NO GROWTH 2 DAYS Performed at [facility], [address] [place], [place] [address]    Report Status PENDING  Incomplete  Culture, blood  (Routine X 2) w Reflex to ID Panel     Status: None   Collection Time: 03/19/18 12:04 AM  Result Value Ref Range Status   Specimen Description BLOOD LEFT ARM  Final   Special Requests   Final    BOTTLES DRAWN AEROBIC AND ANAEROBIC Blood Culture adequate volume   Culture   Final    NO GROWTH 84 DAYS Performed at [facility], [address], [place], [place] [address]    Report Status 03/19/2018 FINAL  Final     Radiology Studies: No results found.  Scheduled Meds: . amLODipine  10 mg Oral Daily  . amoxicillin-clavulanate  1 tablet Oral Q12H  . aspirin EC  81 mg Oral q morning - 10a  . budesonide (PULMICORT) nebulizer solution  0.5 mg Nebulization BID  . enoxaparin (LOVENOX) injection  40 mg Subcutaneous Q24H  . fluticasone  2 spray Each Nare Daily  . folic acid  1 mg Oral Daily  .  guaiFENesin  1,200 mg Oral BID  . ipratropium-albuterol  3 mL Nebulization Q6H WA  . lisinopril  40 mg Oral Daily  . loratadine  10 mg Oral Daily  . mouth rinse  15 mL Mouth Rinse BID  . methylPREDNISolone (SOLU-MEDROL) injection  60 mg Intravenous Q8H  . metoprolol tartrate  100 mg Oral BID  . mirtazapine  7.5 mg Oral q morning - 10a  . multivitamin with minerals  1 tablet Oral Daily  . pantoprazole  40 mg Oral q morning - 10a  . rosuvastatin  40 mg Oral QPC supper  . thiamine  100 mg Oral Daily   Or  . thiamine  100 mg Intravenous Daily   Continuous Infusions: . sodium chloride       LOS: 2 days    Time spent: 30 minutes    Barton Dubois, MD [facility] Pager [phone]  If 7PM-7AM, please contact night-coverage [email or web address] Password [email or web address] 03/21/2018, 2:55 PM

## 2018-03-22 DIAGNOSIS — E43 Unspecified severe protein-calorie malnutrition: Secondary | ICD-10-CM | POA: Diagnosis present

## 2018-03-22 MED ORDER — ENSURE ENLIVE PO LIQD
237.0000 mL | Freq: Two times a day (BID) | ORAL | Status: DC
  Administered 2018-03-22 – 2018-03-24 (×4): 237 mL via ORAL

## 2018-03-22 NOTE — Progress Notes (Signed)
Patient ambulated in hallway with nursing staff, patient O2 Sat lowest drop was to 77%on 3L. Patient complaints of heaviness in breathing toward the end. Patient voiced no other complaints and once back in bed his O2 Sat are back up to 96% on 3L

## 2018-03-22 NOTE — Progress Notes (Signed)
Initial Nutrition Assessment  DOCUMENTATION CODES:   Severe malnutrition in context of social or environmental circumstances, Underweight  INTERVENTION:   - Ensure Enlive po BID, each supplement provides 350 kcal and 20 grams of protein  - Continue MVI with minerals daily  NUTRITION DIAGNOSIS:   Severe Malnutrition related to social / environmental circumstances, chronic illness(EtOH abuse, COPD) as evidenced by moderate fat depletion, severe fat depletion, moderate muscle depletion, severe muscle depletion.  GOAL:   Patient will meet greater than or equal to 90% of their needs  MONITOR:   PO intake, Supplement acceptance, Weight trends, I & O's, Labs  REASON FOR ASSESSMENT:   Other (underweight BMI)    ASSESSMENT:   72 year old male who presented to the ED with SOB. PMH significant for COPD,  hypertension, MI, stroke, chronic respiratory failure on 2 L oxygen at home, CAD, EtOH abuse, and GERD. Admitted with CAP and COPD exacerbation.  Spoke with pt at bedside who reports having "no appetite" and that this has been the case for over 1 year. Pt endorses taking Remeron but states "it doesn't work." Pt reports typically eating 1-2 meals daily. Pt skips breakfast. Lunch may include a snack of an apple and crackers. Dinner typically includes a [employer] meal. Pt also states, "I like to drink beer" and reports drinking at least 6 beers daily.  Pt denies any recent weight loss and reports his UBW as 115-120 lbs. RD obtained bed weight of 127 lbs that was likely skewed due to multiple pillows and blankets on pt's bed. Weight history in chart is limited as pt's last measured weight appears to be from 2014.  Pt endorses drinking 1-1.5 Ensure supplements daily when he takes his medication. Pt agreeable to receiving Ensure Enlive during admission.  Pt states, "they are trying to send me home tomorrow" and reports he is not ready to go home. Pt ambassador taking dinner meal order after RD  visit.  Meal Completion: 50-100%  Medications reviewed and include: 1 mg folic acid daily, 7.5 mg Remeron daily, MVI with minerals daily, 40 mg Protonix daily, 100 mg thiamine daily  Labs reviewed: sodium 133 (L), chloride 92 (L), CO2 35 (H), hemoglobin 11.3 (L), HCT 34.2 (L)  UOP: 2650 ml x 24 hours I/O's: -1.6 L since admission  NUTRITION - FOCUSED PHYSICAL EXAM:    Most Recent Value  Orbital Region  Severe depletion  Upper Arm Region  Severe depletion  Thoracic and Lumbar Region  Severe depletion  Buccal Region  Moderate depletion  Temple Region  Moderate depletion  Clavicle Bone Region  Moderate depletion  Clavicle and Acromion Bone Region  Severe depletion  Scapular Bone Region  Unable to assess  Dorsal Hand  Moderate depletion  Patellar Region  Severe depletion  Anterior Thigh Region  Severe depletion  Posterior Calf Region  Severe depletion  Edema (RD Assessment)  None  Hair  Reviewed  Eyes  Reviewed  Mouth  Reviewed  Skin  Reviewed  Nails  Reviewed       Diet Order:   Diet Order           Diet Heart Room service appropriate? Yes; Fluid consistency: Thin  Diet effective now          EDUCATION NEEDS:   No education needs have been identified at this time  Skin:  Skin Assessment: Reviewed RN Assessment  Last BM:  03/18/18  Height:   Ht Readings from Last 1 Encounters:  03/18/18 5\' 9"  (1.753 m)  Weight:   Wt Readings from Last 1 Encounters:  03/18/18 115 lb (52.2 kg)    Ideal Body Weight:  72.7 kg  BMI:  Body mass index is 16.98 kg/m.  Estimated Nutritional Needs:   Kcal:  1600-1800 kcal/day  Protein:  80-95 grams/day  Fluid:  1.6-1.8 L/day    Gaynell Face, MS, RD, LDN Pager: [phone] Weekend/After Hours: [phone]

## 2018-03-22 NOTE — Care Management Important Message (Signed)
Important Message  Patient Details  Name: Vincent Gilbert MRN: [id number] Date of Birth: 1946/03/07   Medicare Important Message Given:  Yes    [name] 03/22/2018, 11:56 AM

## 2018-03-22 NOTE — Progress Notes (Signed)
PROGRESS NOTE    Vincent Gilbert  ZOX:[id number] DOB: 31-May-1946 DOA: 03/18/2018 PCP: Glenda Chroman, MD    Brief Narrative:  72 y.o. male, with history of hypertension, MI, stroke, chronic respiratory failure on 2 L oxygen at home came to hospital with worsening shortness of breath for past 2 days.  Patient says that he has been coughing but unable to cough up any phlegm.  He denies fever or chills. Denies nausea vomiting or diarrhea.  CXR demonstrated emphysematous changes and acute bibasilar airsapce disease. Admitted for further management of COPD exacerbation and PNA.  Assessment & Plan: 1-acute on chronic respiratory failure with hypoxia: In the setting of COPD exacerbation and community-acquired pneumonia. -Patient has remained afebrile -Slowly improving -Still with difficulties trying to speak in full sentences and is short of breath with minimal exertion. -continue weaning oxygen as tolerated. -will continue steroids, antibiotics, nebulizer treatment, Mucinex (dose adjusted to 1000mg  BID). -continue supportive care and follow clinical response. -Pulmonology has been consulted as per family request; will follow future recommendations.  Questionable use of Mucomyst nebulizer treatment.  2-hypertension -Stable and well-controlled -Continue Norvasc, lopressor and lisinopril.  3-HLD -Continue statins  4-GERD -continue PPI  5-alcohol abuse -cessation counseling provided  -continue CIWA protocol  -no withdrawal symptoms appreciated.   6- severe protein calorie malnutrition -Will follow recommendations by nutritional service and provide feeding supplements.  DVT prophylaxis: lovenox  Code Status: Full code Family Communication: Side. Disposition Plan: Remains inpatient, continue steroids, continue oral antibiotics. Continue oxygen supplementation/wean down as tolerated to baseline.  Continue Pulmicort, continue nebulizer treatment. Continue mucinex   Consultants:    Pulmonology  Procedures:   See below for x-ray reports.  Antimicrobials:  Anti-infectives (From admission, onward)   Start     Dose/Rate Route Frequency Ordered Stop   03/20/18 2200  amoxicillin-clavulanate (AUGMENTIN) 875-125 MG per tablet 1 tablet     1 tablet Oral Every 12 hours 03/20/18 1621     03/19/18 2200  cefTRIAXone (ROCEPHIN) 1 g in sodium chloride 0.9 % 100 mL IVPB  Status:  Discontinued     1 g 200 mL/hr over 30 Minutes Intravenous Every 24 hours 03/19/18 0232 03/20/18 1621   03/19/18 2200  azithromycin (ZITHROMAX) 500 mg in sodium chloride 0.9 % 250 mL IVPB  Status:  Discontinued     500 mg 250 mL/hr over 60 Minutes Intravenous Every 24 hours 03/19/18 0232 03/20/18 1621   03/18/18 2345  cefTRIAXone (ROCEPHIN) 1 g in sodium chloride 0.9 % 100 mL IVPB     1 g 200 mL/hr over 30 Minutes Intravenous  Once 03/18/18 2342 03/19/18 0018   03/18/18 2345  azithromycin (ZITHROMAX) 500 mg in sodium chloride 0.9 % 250 mL IVPB     500 mg 250 mL/hr over 60 Minutes Intravenous  Once 03/18/18 2342 03/19/18 0131     Subjective: No fever, no chest pain.  Still with shortness of breath, having productive cough and diffuse wheezing.  Oxygen desaturation with exertion and no feeling back to baseline.  Objective: Vitals:   03/22/18 0712 03/22/18 0717 03/22/18 1430 03/22/18 1451  BP:    (!) 112/47  Pulse:    72  Resp:    16  Temp:    (!) 97.5 F (36.4 C)  TempSrc:    Oral  SpO2: 99% 99% 98% 97%  Weight:      Height:        Intake/Output Summary (Last 24 hours) at 03/22/2018 1815 Last data filed at 03/22/2018  1200 Gross per 24 hour  Intake 600 ml  Output 1800 ml  Net -1200 ml   Filed Weights   03/18/18 2248  Weight: 52.2 kg (115 lb)    Examination: General exam: Alert, awake, oriented x 3; continue to a slowly improve his breathing.  Still short of breath with exertion and is saturating into the mid 70s while walking on the hallways.  Denies chest pain, no nausea, no  vomiting.  Still with ongoing productive cough.  No fever. Respiratory system: Positive rhonchi, diffuse expiratory wheezing, no crackles, no using accessory muscles; fair air movement.   Cardiovascular system:RRR. No murmurs, rubs, gallops. Gastrointestinal system: Abdomen is nondistended, soft and nontender. No organomegaly or masses felt. Normal bowel sounds heard. Central nervous system: Alert and oriented. No focal neurological deficits. Extremities: No C/C/E, +pedal pulses Skin: No rashes, lesions or ulcers Psychiatry: Judgement and insight appear normal. Mood & affect appropriate.    Data Reviewed: I have personally reviewed following labs and imaging studies  CBC: Recent Labs  Lab 03/18/18 2310 03/19/18 0507 03/20/18 0632  WBC 16.2* 17.3* 24.8*  HGB 14.2 12.2* 11.3*  HCT 41.3 36.1* 34.2*  MCV 98.6 99.2 100.6*  PLT 314 280 323   Basic Metabolic Panel: Recent Labs  Lab 03/18/18 2310 03/19/18 0507 03/20/18 0632  NA 127* 132* 133*  K 3.6 3.7 3.9  CL 86* 92* 92*  CO2 33* 32 35*  GLUCOSE 119* 141* 164*  BUN 6* 6* 11  CREATININE 0.52* 0.59* 0.62  CALCIUM 8.9 8.1* 8.2*   GFR: Estimated Creatinine Clearance: 63.4 mL/min (by C-G formula based on SCr of 0.62 mg/dL).   Liver Function Tests: Recent Labs  Lab 03/19/18 0507  AST 18  ALT 9  ALKPHOS 65  BILITOT 0.6  PROT 6.1*  ALBUMIN 3.1*   Urine analysis:    Component Value Date/Time   COLORURINE YELLOW 03/10/2012 [place] 03/10/2012 1312   LABSPEC 1.017 03/10/2012 1312   PHURINE 6.5 03/10/2012 1312   GLUCOSEU NEGATIVE 03/10/2012 1312   HGBUR NEGATIVE 03/10/2012 1312   BILIRUBINUR NEGATIVE 03/10/2012 1312   KETONESUR NEGATIVE 03/10/2012 1312   PROTEINUR 100 (A) 03/10/2012 1312   UROBILINOGEN 1.0 03/10/2012 1312   NITRITE NEGATIVE 03/10/2012 1312   LEUKOCYTESUR NEGATIVE 03/10/2012 1312    Recent Results (from the past 240 hour(s))  Culture, blood (Routine X 2) w Reflex to ID Panel      Status: None (Preliminary result)   Collection Time: 03/18/18 11:55 PM  Result Value Ref Range Status   Specimen Description BLOOD LEFT ARM  Final   Special Requests   Final    BOTTLES DRAWN AEROBIC AND ANAEROBIC Blood Culture adequate volume   Culture   Final    NO GROWTH 3 DAYS Performed at [facility], [address] [place], [place] [address]    Report Status PENDING  Incomplete  Culture, blood (Routine X 2) w Reflex to ID Panel     Status: None   Collection Time: 03/19/18 12:04 AM  Result Value Ref Range Status   Specimen Description BLOOD LEFT ARM  Final   Special Requests   Final    BOTTLES DRAWN AEROBIC AND ANAEROBIC Blood Culture adequate volume   Culture   Final    NO GROWTH 84 DAYS Performed at [facility], [address], [place], [place] [address]    Report Status 03/19/2018 FINAL  Final     Radiology Studies: No results found.  Scheduled Meds: .  amLODipine  10 mg Oral Daily  . amoxicillin-clavulanate  1 tablet Oral Q12H  . aspirin EC  81 mg Oral q morning - 10a  . budesonide (PULMICORT) nebulizer solution  0.5 mg Nebulization BID  . enoxaparin (LOVENOX) injection  40 mg Subcutaneous Q24H  . feeding supplement (ENSURE ENLIVE)  237 mL Oral BID BM  . fluticasone  2 spray Each Nare Daily  . folic acid  1 mg Oral Daily  . guaiFENesin  1,200 mg Oral BID  . ipratropium-albuterol  3 mL Nebulization Q6H WA  . lisinopril  40 mg Oral Daily  . loratadine  10 mg Oral Daily  . mouth rinse  15 mL Mouth Rinse BID  . methylPREDNISolone (SOLU-MEDROL) injection  60 mg Intravenous Q8H  . metoprolol tartrate  100 mg Oral BID  . mirtazapine  7.5 mg Oral q morning - 10a  . multivitamin with minerals  1 tablet Oral Daily  . pantoprazole  40 mg Oral q morning - 10a  . rosuvastatin  40 mg Oral QPC supper  . thiamine  100 mg Oral Daily   Or  . thiamine  100 mg Intravenous Daily   Continuous Infusions: . sodium chloride       LOS: 3 days    Time spent: 30  minutes    Barton Dubois, MD [facility] Pager [phone]  If 7PM-7AM, please contact night-coverage [email or web address] Password [facility] 03/22/2018, 6:15 PM

## 2018-03-23 ENCOUNTER — Inpatient Hospital Stay (HOSPITAL_COMMUNITY): Payer: Medicare Other

## 2018-03-23 DIAGNOSIS — I361 Nonrheumatic tricuspid (valve) insufficiency: Secondary | ICD-10-CM

## 2018-03-23 LAB — ECHOCARDIOGRAM COMPLETE
Height: 69 in
Weight: 1840 oz

## 2018-03-23 MED ORDER — ACETYLCYSTEINE 20 % IN SOLN
3.0000 mL | RESPIRATORY_TRACT | Status: DC
  Administered 2018-03-23 – 2018-03-26 (×16): 3 mL via RESPIRATORY_TRACT
  Administered 2018-03-26 (×2): 4 mL via RESPIRATORY_TRACT
  Administered 2018-03-27: 3 mL via RESPIRATORY_TRACT
  Administered 2018-03-27: 4 mL via RESPIRATORY_TRACT
  Administered 2018-03-27: 3 mL via RESPIRATORY_TRACT
  Filled 2018-03-23 (×22): qty 4

## 2018-03-23 MED ORDER — IPRATROPIUM-ALBUTEROL 0.5-2.5 (3) MG/3ML IN SOLN
3.0000 mL | RESPIRATORY_TRACT | Status: DC
  Administered 2018-03-23 – 2018-03-27 (×21): 3 mL via RESPIRATORY_TRACT
  Filled 2018-03-23 (×23): qty 3

## 2018-03-23 MED ORDER — IOHEXOL 300 MG/ML  SOLN
75.0000 mL | Freq: Once | INTRAMUSCULAR | Status: AC | PRN
  Administered 2018-03-23: 75 mL via INTRAVENOUS

## 2018-03-23 NOTE — Consult Note (Signed)
   [facility] Inpatient Consult   03/23/2018  BRANDY KABAT 1945-11-01 [id number]   Inpatient case manager requested referral for [place] Management services and post hospital discharge follow up related to a diagnosis of Pneumonia and COPD. Patient was evaluated for community based chronic disease management services with [facility] care Management Program as a benefit of patient's [employer]. Met with the patient at the bedside to explain [place] Management services. Patient states he receives some assistance through the [place]. Patient denies the need for transportation or medication affordability assistance. States his daughter takes him to his appointments. Verbal consent recieved. Patient gave [phone]  as the best number to reach him. Patient will receive post hospital discharge calls and be evaluated for monthly home visits. [facility] Care Management services do not interfere with or replace any services arranged by the inpatient care management team. RNCM left contact information and [facility] literature at the bedside. Made inpatient RNCM aware  [facility] will be following for care management. For additional questions please contact:   Kasheena Sambrano RN, [facility] Liaison  [phone]) Business Mobile [phone]) Toll free office

## 2018-03-23 NOTE — Consult Note (Signed)
Consult requested by: [facility] hospitalist Dr. Dyann Kief Consult requested for: Pneumonia/respiratory failure/COPD exacerbation  HPI: This is a 72 year old with history of hypertension previous MI previous stroke chronic hypoxic respiratory failure, COPD.  He says he was in the hospital last summer and he does not feel like he is gotten over all of that since.  He has been more short of breath.  Previously he could manage his activities of daily living but now he is getting short of breath with minimal exertion such as walking across the house.  He does wear his oxygen most of the time.  He is still smoking a little bit.  He has not coughed up any blood.  He has a little bit of swelling of his legs and the end of the day.  He denies any chest pain.  He has not had any hemoptysis.  He is coughing up purulent sputum.  He has had an extensive evaluation at the [place] including a bronchoscopy and he has had a PET scan that was negative.  There is some concern because he had exposure to agent orange.  Past Medical History:  Diagnosis Date  . Carotid artery occlusion   . COPD (chronic obstructive pulmonary disease) ([place])   . Coronary artery disease   . GERD (gastroesophageal reflux disease)   . Heart murmur   . Hypertension   . Myocardial infarction ([place]) 12/12  . Stroke [facility]) 09/10/2011     Family History  Problem Relation Age of Onset  . Heart disease Mother   . Heart disease Father      Social History   Socioeconomic History  . Marital status: Widowed    Spouse name: Not on file  . Number of children: Not on file  . Years of education: Not on file  . Highest education level: Not on file  Occupational History  . Not on file  Social Needs  . Financial resource strain: Not on file  . Food insecurity:    Worry: Not on file    Inability: Not on file  . Transportation needs:    Medical: Not on file    Non-medical: Not on file  Tobacco Use  . Smoking status: Current Every Day Smoker   Packs/day: 0.75    Years: 40.00    Pack years: 30.00    Types: Cigarettes    Start date: 02/26/1956  . Smokeless tobacco: Never Used  . Tobacco comment: 1/2 pk per day  Substance and Sexual Activity  . Alcohol use: Yes    Alcohol/week: 25.2 oz    Types: 42 Cans of beer per week    Comment: 2-3x/ wk  . Drug use: No  . Sexual activity: Not on file  Lifestyle  . Physical activity:    Days per week: Not on file    Minutes per session: Not on file  . Stress: Not on file  Relationships  . Social connections:    Talks on phone: Not on file    Gets together: Not on file    Attends religious service: Not on file    Active member of club or organization: Not on file    Attends meetings of clubs or organizations: Not on file    Relationship status: Not on file  Other Topics Concern  . Not on file  Social History Narrative  . Not on file     ROS: Except as mentioned 10 point review of systems is negative    Objective: Vital signs in last  24 hours: Temp:  [97.5 F (36.4 C)-98.8 F (37.1 C)] 98.4 F (36.9 C) (07/02 0300) Pulse Rate:  [69-78] 77 (07/02 1347) Resp:  [16-19] 17 (07/02 1347) BP: (110-132)/(47-63) 130/56 (07/02 1347) SpO2:  [94 %-100 %] 99 % (07/02 1347) Weight change:  Last BM Date: 03/18/18  Intake/Output from previous day: 07/01 0701 - 07/02 0700 In: 960 [P.O.:960] Out: 1300 [Urine:1300]  PHYSICAL EXAM Constitutional: He is thin.  He looks a little short of breath at rest.  Eyes: Pupils are reactive nose and throat are clear.  Ears nose mouth and throat: His mucous membranes are somewhat dry.  Hearing is diminished.  Throat is clear.  Cardiovascular: He is in normal rhythm and I do not hear a gallop.  Respiratory: Respiratory effort still increased and he still has rhonchi and wheezing bilaterally.  Gastrointestinal: His abdomen is soft with no masses.  Neurological: He does not have any focal findings now.  Psychiatric: He seems a little anxious.  Skin: Warm  and dry  Lab Results: Basic Metabolic Panel: No results for input(s): NA, K, CL, CO2, GLUCOSE, BUN, CREATININE, CALCIUM, MG, PHOS in the last 72 hours. Liver Function Tests: No results for input(s): AST, ALT, ALKPHOS, BILITOT, PROT, ALBUMIN in the last 72 hours. No results for input(s): LIPASE, AMYLASE in the last 72 hours. No results for input(s): AMMONIA in the last 72 hours. CBC: No results for input(s): WBC, NEUTROABS, HGB, HCT, MCV, PLT in the last 72 hours. Cardiac Enzymes: No results for input(s): CKTOTAL, CKMB, CKMBINDEX, TROPONINI in the last 72 hours. BNP: No results for input(s): PROBNP in the last 72 hours. D-Dimer: No results for input(s): DDIMER in the last 72 hours. CBG: No results for input(s): GLUCAP in the last 72 hours. Hemoglobin A1C: No results for input(s): HGBA1C in the last 72 hours. Fasting Lipid Panel: No results for input(s): CHOL, HDL, LDLCALC, TRIG, CHOLHDL, LDLDIRECT in the last 72 hours. Thyroid Function Tests: No results for input(s): TSH, T4TOTAL, FREET4, T3FREE, THYROIDAB in the last 72 hours. Anemia Panel: No results for input(s): VITAMINB12, FOLATE, FERRITIN, TIBC, IRON, RETICCTPCT in the last 72 hours. Coagulation: No results for input(s): LABPROT, INR in the last 72 hours. Urine Drug Screen: Drugs of Abuse  No results found for: LABOPIA, COCAINSCRNUR, LABBENZ, AMPHETMU, THCU, LABBARB  Alcohol Level: No results for input(s): ETH in the last 72 hours. Urinalysis: No results for input(s): COLORURINE, LABSPEC, PHURINE, GLUCOSEU, HGBUR, BILIRUBINUR, KETONESUR, PROTEINUR, UROBILINOGEN, NITRITE, LEUKOCYTESUR in the last 72 hours.  Invalid input(s): APPERANCEUR Misc. Labs:   ABGS: No results for input(s): PHART, PO2ART, TCO2, HCO3 in the last 72 hours.  Invalid input(s): PCO2   MICROBIOLOGY: Recent Results (from the past 240 hour(s))  Culture, blood (Routine X 2) w Reflex to ID Panel     Status: None (Preliminary result)   Collection  Time: 03/18/18 11:55 PM  Result Value Ref Range Status   Specimen Description BLOOD LEFT ARM  Final   Special Requests   Final    BOTTLES DRAWN AEROBIC AND ANAEROBIC Blood Culture adequate volume   Culture   Final    NO GROWTH 4 DAYS Performed at [facility], [address], [place], [place] [address]    Report Status PENDING  Incomplete  Culture, blood (Routine X 2) w Reflex to ID Panel     Status: None   Collection Time: 03/19/18 12:04 AM  Result Value Ref Range Status   Specimen Description BLOOD LEFT ARM  Final   Special  Requests   Final    BOTTLES DRAWN AEROBIC AND ANAEROBIC Blood Culture adequate volume   Culture   Final    NO GROWTH 84 DAYS Performed at [facility], [address], [place], [place] [address]    Report Status 03/19/2018 FINAL  Final    Studies/Results: No results found.  Medications:  Prior to Admission:  Medications Prior to Admission  Medication Sig Dispense Refill Last Dose  . albuterol (PROVENTIL HFA;VENTOLIN HFA) 108 (90 Base) MCG/ACT inhaler Inhale 2 puffs into the lungs every 6 (six) hours as needed for wheezing or shortness of breath.   03/18/2018 at Unknown time  . amLODipine (NORVASC) 10 MG tablet Take 10 mg by mouth daily.   03/18/2018 at Unknown time  . aspirin EC 81 MG tablet Take 81 mg by mouth every morning.    03/18/2018 at Unknown time  . budesonide-formoterol (SYMBICORT) 80-4.5 MCG/ACT inhaler Inhale 2 puffs into the lungs 2 (two) times daily.   03/18/2018 at Unknown time  . cetirizine (ZYRTEC) 10 MG tablet Take 10 mg by mouth daily.   03/18/2018 at Unknown time  . fluticasone (FLONASE) 50 MCG/ACT nasal spray Place 2 sprays into both nostrils daily.  4 03/17/2018 at Unknown time  . lisinopril (PRINIVIL,ZESTRIL) 40 MG tablet Take 1 tablet (40 mg total) by mouth daily. 90 tablet 3 03/18/2018 at Unknown time  . metoprolol (LOPRESSOR) 100 MG tablet TAKE 1 TABLET (100 MG TOTAL) BY MOUTH 2 (TWO) TIMES DAILY. 60 tablet 3 03/18/2018 at Unknown time  .  mirtazapine (REMERON) 7.5 MG tablet every morning.   03/18/2018 at Unknown time  . Multiple Vitamin (MULTIVITAMIN) tablet Take 1 tablet by mouth daily.   Past Month at Unknown time  . oral electrolytes (THERMOTABS) TABS tablet Take 1 tablet by mouth daily.   03/18/2018 at Unknown time  . pantoprazole (PROTONIX) 40 MG tablet Take 40 mg by mouth every morning.    03/18/2018 at Unknown time  . Potassium 99 MG TABS Take 1 tablet by mouth daily.   Past Month at Unknown time  . rosuvastatin (CRESTOR) 40 MG tablet Take 40 mg by mouth daily after supper.   Past Week at Unknown time  . tiotropium (SPIRIVA) 18 MCG inhalation capsule Place 18 mcg into inhaler and inhale daily.   03/18/2018 at Unknown time   Scheduled: . acetylcysteine  3 mL Nebulization Q4H  . amLODipine  10 mg Oral Daily  . amoxicillin-clavulanate  1 tablet Oral Q12H  . aspirin EC  81 mg Oral q morning - 10a  . budesonide (PULMICORT) nebulizer solution  0.5 mg Nebulization BID  . enoxaparin (LOVENOX) injection  40 mg Subcutaneous Q24H  . feeding supplement (ENSURE ENLIVE)  237 mL Oral BID BM  . fluticasone  2 spray Each Nare Daily  . folic acid  1 mg Oral Daily  . guaiFENesin  1,200 mg Oral BID  . ipratropium-albuterol  3 mL Nebulization Q4H  . lisinopril  40 mg Oral Daily  . loratadine  10 mg Oral Daily  . mouth rinse  15 mL Mouth Rinse BID  . methylPREDNISolone (SOLU-MEDROL) injection  60 mg Intravenous Q8H  . metoprolol tartrate  100 mg Oral BID  . mirtazapine  7.5 mg Oral q morning - 10a  . multivitamin with minerals  1 tablet Oral Daily  . pantoprazole  40 mg Oral q morning - 10a  . rosuvastatin  40 mg Oral QPC supper  . thiamine  100 mg Oral Daily  Or  . thiamine  100 mg Intravenous Daily   Continuous: . sodium chloride     ZMO:QHUTML chloride, acetaminophen **OR** acetaminophen, diphenhydrAMINE  Assesment: He was admitted with community-acquired pneumonia and COPD exacerbation.  He has acute on chronic hypoxic  respiratory failure.  He is improving but slowly.  He has protein calorie malnutrition which obviously impact his situation.  He has personal history of heart attack and considering his malnutrition and alcohol abuse I would like to check echocardiogram to see about his left and right ventricular function and also to see if he has pulmonary hypertension.  I considered repeat chest x-ray but I think we will get much more information out of CT so have ordered that. Active Problems:   Hypertension   Alcohol abuse   CAP (community acquired pneumonia)   Protein-calorie malnutrition, severe    Plan: Add Mucomyst.  Try incentive spirometry.  Check CT of the chest.  Check echocardiogram.  Continue other treatments.  Thanks for allowing me to see him with you    LOS: 4 days   Jaysie Benthall L 03/23/2018, 2:04 PM

## 2018-03-23 NOTE — Progress Notes (Signed)
*  PRELIMINARY RESULTS* Echocardiogram 2D Echocardiogram has been performed.  Leavy Cella 03/23/2018, 3:44 PM

## 2018-03-23 NOTE — Progress Notes (Signed)
PROGRESS NOTE    Vincent Gilbert  KPV:[id number] DOB: [birth year] DOA: 03/18/2018 PCP: Glenda Chroman, MD    Brief Narrative:  72 y.o. male, with history of hypertension, MI, stroke, chronic respiratory failure on 2 L oxygen at home came to hospital with worsening shortness of breath for past 2 days.  Patient says that he has been coughing but unable to cough up any phlegm.  He denies fever or chills. Denies nausea vomiting or diarrhea.  CXR demonstrated emphysematous changes and acute bibasilar airsapce disease. Admitted for further management of COPD exacerbation and PNA.  Assessment & Plan: 1-acute on chronic respiratory failure with hypoxia: In the setting of COPD exacerbation and community-acquired pneumonia. -Patient has remained afebrile -Slowly improving -Still with difficulties trying to speak in full sentences and is short of breath with minimal exertion. -continue weaning oxygen as tolerated. -will continue steroids, antibiotics and pulmicort. Start incentive spirometry and continue flutter valve. -continue supportive care and follow clinical response. -Pulmonology has been consulted and appreciate rec's. Will check 2-D echo, CT scan of the chest and start treatment with Mucomyst nebulizer.   2-hypertension -Overall stable and well-controlled -Continue Norvasc, lopressor and lisinopril.  3-HLD -Continue statins  4-GERD -continue PPI  5-alcohol abuse -cessation counseling provided  -continue CIWA protocol  -no withdrawal symptoms appreciated.   6- severe protein calorie malnutrition -Will follow recommendations by nutritional service and provide feeding supplements.  DVT prophylaxis: lovenox  Code Status: Full code Family Communication: Side. Disposition Plan: Remains inpatient, continue steroids, continue oral antibiotics. Continue oxygen supplementation/wean down as tolerated to baseline. Will Continue Pulmicort, start Mucomyst nebulizer treatment. Continue  mucinex. Will also check 2-D echo and CT scan as per pulmonary rec's.   Consultants:   Pulmonology  Procedures:   See below for x-ray reports.  Antimicrobials:  Anti-infectives (From admission, onward)   Start     Dose/Rate Route Frequency Ordered Stop   03/20/18 2200  amoxicillin-clavulanate (AUGMENTIN) 875-125 MG per tablet 1 tablet     1 tablet Oral Every 12 hours 03/20/18 1621     03/19/18 2200  cefTRIAXone (ROCEPHIN) 1 g in sodium chloride 0.9 % 100 mL IVPB  Status:  Discontinued     1 g 200 mL/hr over 30 Minutes Intravenous Every 24 hours 03/19/18 0232 03/20/18 1621   03/19/18 2200  azithromycin (ZITHROMAX) 500 mg in sodium chloride 0.9 % 250 mL IVPB  Status:  Discontinued     500 mg 250 mL/hr over 60 Minutes Intravenous Every 24 hours 03/19/18 0232 03/20/18 1621   03/18/18 2345  cefTRIAXone (ROCEPHIN) 1 g in sodium chloride 0.9 % 100 mL IVPB     1 g 200 mL/hr over 30 Minutes Intravenous  Once 03/18/18 2342 03/19/18 0018   03/18/18 2345  azithromycin (ZITHROMAX) 500 mg in sodium chloride 0.9 % 250 mL IVPB     500 mg 250 mL/hr over 60 Minutes Intravenous  Once 03/18/18 2342 03/19/18 0131     Subjective: Afebrile, denies chest pain.  Still short of breath, having productive cough and with ongoing diffuse wheezing.  Patient also with oxygen desaturation with exertion despite supplementation.  Objective: Vitals:   03/23/18 0728 03/23/18 0736 03/23/18 1325 03/23/18 1347  BP:    (!) 130/56  Pulse:    77  Resp:    17  Temp:      TempSrc:      SpO2: 94% 94% 98% 99%  Weight:      Height:  Intake/Output Summary (Last 24 hours) at 03/23/2018 1436 Last data filed at 03/23/2018 1200 Gross per 24 hour  Intake 1200 ml  Output 1000 ml  Net 200 ml   Filed Weights   03/18/18 2248  Weight: 52.2 kg (115 lb)    Examination: General exam: Alert, awake, oriented x 3; continue to improve slowly from a respiratory standpoint.  Patient short of breath with exertion and is  desaturating into the mid 70s despite oxygen supplementation.  He was also having difficulty speaking in full sentences and is still experiencing productive cough.  He is afebrile, denies chest pain and is otherwise in no acute distress. Respiratory system: Diffuse expiratory wheezing, no using accessory muscles, positive rhonchi appreciated bilaterally; no crackles.  Cardiovascular system:RRR. No murmurs, rubs, gallops. Gastrointestinal system: Abdomen is nondistended, soft and nontender. No organomegaly or masses felt. Normal bowel sounds heard. Central nervous system: Alert and oriented. No focal neurological deficits. Extremities: No C/C/E, +pedal pulses Skin: No rashes, lesions or ulcers Psychiatry: Judgement and insight appear normal. Mood & affect appropriate.    Data Reviewed: I have personally reviewed following labs and imaging studies  CBC: Recent Labs  Lab 03/18/18 2310 03/19/18 0507 03/20/18 0632  WBC 16.2* 17.3* 24.8*  HGB 14.2 12.2* 11.3*  HCT 41.3 36.1* 34.2*  MCV 98.6 99.2 100.6*  PLT 314 280 161   Basic Metabolic Panel: Recent Labs  Lab 03/18/18 2310 03/19/18 0507 03/20/18 0632  NA 127* 132* 133*  K 3.6 3.7 3.9  CL 86* 92* 92*  CO2 33* 32 35*  GLUCOSE 119* 141* 164*  BUN 6* 6* 11  CREATININE 0.52* 0.59* 0.62  CALCIUM 8.9 8.1* 8.2*   GFR: Estimated Creatinine Clearance: 63.4 mL/min (by C-G formula based on SCr of 0.62 mg/dL).   Liver Function Tests: Recent Labs  Lab 03/19/18 0507  AST 18  ALT 9  ALKPHOS 65  BILITOT 0.6  PROT 6.1*  ALBUMIN 3.1*   Urine analysis:    Component Value Date/Time   COLORURINE YELLOW 03/10/2012 [place] 03/10/2012 1312   LABSPEC 1.017 03/10/2012 1312   PHURINE 6.5 03/10/2012 1312   GLUCOSEU NEGATIVE 03/10/2012 1312   HGBUR NEGATIVE 03/10/2012 1312   BILIRUBINUR NEGATIVE 03/10/2012 1312   KETONESUR NEGATIVE 03/10/2012 1312   PROTEINUR 100 (A) 03/10/2012 1312   UROBILINOGEN 1.0 03/10/2012 1312     NITRITE NEGATIVE 03/10/2012 1312   LEUKOCYTESUR NEGATIVE 03/10/2012 1312    Recent Results (from the past 240 hour(s))  Culture, blood (Routine X 2) w Reflex to ID Panel     Status: None (Preliminary result)   Collection Time: 03/18/18 11:55 PM  Result Value Ref Range Status   Specimen Description BLOOD LEFT ARM  Final   Special Requests   Final    BOTTLES DRAWN AEROBIC AND ANAEROBIC Blood Culture adequate volume   Culture   Final    NO GROWTH 4 DAYS Performed at [facility], [address], [place], [place] [address]    Report Status PENDING  Incomplete  Culture, blood (Routine X 2) w Reflex to ID Panel     Status: None   Collection Time: 03/19/18 12:04 AM  Result Value Ref Range Status   Specimen Description BLOOD LEFT ARM  Final   Special Requests   Final    BOTTLES DRAWN AEROBIC AND ANAEROBIC Blood Culture adequate volume   Culture   Final    NO GROWTH 84 DAYS Performed at [facility], [address], [place],  [place] [address]    Report Status 03/19/2018 FINAL  Final     Radiology Studies: No results found.  Scheduled Meds: . acetylcysteine  3 mL Nebulization Q4H  . amLODipine  10 mg Oral Daily  . amoxicillin-clavulanate  1 tablet Oral Q12H  . aspirin EC  81 mg Oral q morning - 10a  . budesonide (PULMICORT) nebulizer solution  0.5 mg Nebulization BID  . enoxaparin (LOVENOX) injection  40 mg Subcutaneous Q24H  . feeding supplement (ENSURE ENLIVE)  237 mL Oral BID BM  . fluticasone  2 spray Each Nare Daily  . folic acid  1 mg Oral Daily  . guaiFENesin  1,200 mg Oral BID  . ipratropium-albuterol  3 mL Nebulization Q4H  . lisinopril  40 mg Oral Daily  . loratadine  10 mg Oral Daily  . mouth rinse  15 mL Mouth Rinse BID  . methylPREDNISolone (SOLU-MEDROL) injection  60 mg Intravenous Q8H  . metoprolol tartrate  100 mg Oral BID  . mirtazapine  7.5 mg Oral q morning - 10a  . multivitamin with minerals  1 tablet Oral Daily  . pantoprazole  40 mg Oral q morning - 10a   . rosuvastatin  40 mg Oral QPC supper  . thiamine  100 mg Oral Daily   Or  . thiamine  100 mg Intravenous Daily   Continuous Infusions: . sodium chloride       LOS: 4 days    Time spent: 30 minutes    Barton Dubois, MD [facility] Pager [phone]  If 7PM-7AM, please contact night-coverage [email or web address] Password [facility] 03/23/2018, 2:36 PM

## 2018-03-24 DIAGNOSIS — E43 Unspecified severe protein-calorie malnutrition: Secondary | ICD-10-CM

## 2018-03-24 DIAGNOSIS — J189 Pneumonia, unspecified organism: Secondary | ICD-10-CM

## 2018-03-24 DIAGNOSIS — J441 Chronic obstructive pulmonary disease with (acute) exacerbation: Secondary | ICD-10-CM

## 2018-03-24 DIAGNOSIS — F1010 Alcohol abuse, uncomplicated: Secondary | ICD-10-CM

## 2018-03-24 DIAGNOSIS — I10 Essential (primary) hypertension: Secondary | ICD-10-CM

## 2018-03-24 DIAGNOSIS — J9621 Acute and chronic respiratory failure with hypoxia: Secondary | ICD-10-CM

## 2018-03-24 LAB — BASIC METABOLIC PANEL
Anion gap: 7 (ref 5–15)
BUN: 16 mg/dL (ref 8–23)
CALCIUM: 8.2 mg/dL — AB (ref 8.9–10.3)
CO2: 41 mmol/L — ABNORMAL HIGH (ref 22–32)
CREATININE: 0.65 mg/dL (ref 0.61–1.24)
Chloride: 85 mmol/L — ABNORMAL LOW (ref 98–111)
GFR calc Af Amer: >60 mL/min (ref >60)
Glucose, Bld: 139 mg/dL — ABNORMAL HIGH (ref 70–99)
POTASSIUM: 4.4 mmol/L (ref 3.5–5.1)
SODIUM: 133 mmol/L — AB (ref 135–145)

## 2018-03-24 LAB — CBC
HCT: 36.7 % — ABNORMAL LOW (ref 39.0–52.0)
Hemoglobin: 12.1 g/dL — ABNORMAL LOW (ref 13.0–17.0)
MCH: 33.3 pg (ref 26.0–34.0)
MCHC: 33 g/dL (ref 30.0–36.0)
MCV: 101.1 fL — ABNORMAL HIGH (ref 78.0–100.0)
PLATELETS: 297 10*3/uL (ref 150–400)
RBC: 3.63 MIL/uL — ABNORMAL LOW (ref 4.22–5.81)
RDW: 11.8 % (ref 11.5–15.5)
WBC: 7.4 10*3/uL (ref 4.0–10.5)

## 2018-03-24 LAB — CULTURE, BLOOD (ROUTINE X 2)
CULTURE: NO GROWTH
Special Requests: ADEQUATE

## 2018-03-24 MED ORDER — ARFORMOTEROL TARTRATE 15 MCG/2ML IN NEBU
15.0000 ug | INHALATION_SOLUTION | Freq: Two times a day (BID) | RESPIRATORY_TRACT | Status: DC
  Administered 2018-03-24 – 2018-03-27 (×6): 15 ug via RESPIRATORY_TRACT
  Filled 2018-03-24 (×6): qty 2

## 2018-03-24 NOTE — Care Management Important Message (Signed)
Important Message  Patient Details  Name: Vincent Gilbert MRN: [id number] Date of Birth: [birth year]   Medicare Important Message Given:  Yes    [name] 03/24/2018, 11:56 AM

## 2018-03-24 NOTE — Progress Notes (Signed)
Patient ambulated to nurses station and back x 2..  Patient moderately SOB and sats decreased as low as 79% on 3L.  Increased O2 to 4L and sats ranged from 85-87%.  Returned to room and at rest O2 sats 89% on 3L. Unable to wean to home O2 level

## 2018-03-24 NOTE — Progress Notes (Signed)
Subjective: He says he feels a little bit better.  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  Pulmonary nodule on the right is unchanged.  Pulmonary nodule on the left is smaller.  He has bilateral infiltrates in the lower lobes which I think represents bilateral pneumonia and I think that is why he is having such a prolonged recovery.Marland Kitchen  He is still requiring increased oxygen.  He is not coughing much up.  I did personally review the CT.  He also had echocardiogram done yesterday and that is in essence normal.  Objective: Vital signs in last 24 hours: Temp:  [98.2 F (36.8 C)-98.4 F (36.9 C)] 98.4 F (36.9 C) (07/03 0243) Pulse Rate:  [75-80] 75 (07/03 0243) Resp:  [17-19] 18 (07/03 0243) BP: (116-146)/(56-77) 116/77 (07/03 0243) SpO2:  [91 %-99 %] 96 % (07/03 0803) Weight change:  Last BM Date: 03/24/18  Intake/Output from previous day: 07/02 0701 - 07/03 0700 In: 1320 [P.O.:1320] Out: 1250 [Urine:1250]  PHYSICAL EXAM General appearance: alert, cooperative and no distress Resp: rhonchi bilaterally Cardio: regular rate and rhythm, S1, S2 normal, no murmur, click, rub or gallop GI: soft, non-tender; bowel sounds normal; no masses,  no organomegaly Extremities: extremities normal, atraumatic, no cyanosis or edema  Lab Results:  Results for orders placed or performed during the hospital encounter of 03/18/18 (from the past 48 hour(s))  CBC     Status: Abnormal   Collection Time: 03/24/18  5:48 AM  Result Value Ref Range   WBC 7.4 4.0 - 10.5 K/uL   RBC 3.63 (L) 4.22 - 5.81 MIL/uL   Hemoglobin 12.1 (L) 13.0 - 17.0 g/dL   HCT 36.7 (L) 39.0 - 52.0 %   MCV 101.1 (H) 78.0 - 100.0 fL   MCH 33.3 26.0 - 34.0 pg   MCHC 33.0 30.0 - 36.0 g/dL   RDW 11.8 11.5 - 15.5 %   Platelets 297 150 - 400 K/uL    Comment: Performed at [facility], [address] [place], [place] [address]  Basic metabolic panel     Status: Abnormal   Collection Time: 03/24/18  5:48 AM  Result Value Ref Range   Sodium  133 (L) 135 - 145 mmol/L   Potassium 4.4 3.5 - 5.1 mmol/L   Chloride 85 (L) 98 - 111 mmol/L    Comment: Please note change in reference range.   CO2 41 (H) 22 - 32 mmol/L   Glucose, Bld 139 (H) 70 - 99 mg/dL    Comment: Please note change in reference range.   BUN 16 8 - 23 mg/dL    Comment: Please note change in reference range.   Creatinine, Ser 0.65 0.61 - 1.24 mg/dL   Calcium 8.2 (L) 8.9 - 10.3 mg/dL   GFR calc non Af Amer >60 >60 mL/min   GFR calc Af Amer >60 >60 mL/min    Comment: (NOTE) The eGFR has been calculated using the CKD EPI equation. This calculation has not been validated in all clinical situations. eGFR's persistently <60 mL/min signify possible Chronic Kidney Disease.    Anion gap 7 5 - 15    Comment: Performed at [facility], [address], [place], [place] [address]    ABGS No results for input(s): PHART, PO2ART, TCO2, HCO3 in the last 72 hours.  Invalid input(s): PCO2 CULTURES Recent Results (from the past 240 hour(s))  Culture, blood (Routine X 2) w Reflex to ID Panel     Status: None   Collection Time: 03/18/18 11:55  PM  Result Value Ref Range Status   Specimen Description BLOOD LEFT ARM  Final   Special Requests   Final    BOTTLES DRAWN AEROBIC AND ANAEROBIC Blood Culture adequate volume   Culture   Final    NO GROWTH 5 DAYS Performed at [facility], [address], [place], [place] [address]    Report Status 03/24/2018 FINAL  Final  Culture, blood (Routine X 2) w Reflex to ID Panel     Status: None   Collection Time: 03/19/18 12:04 AM  Result Value Ref Range Status   Specimen Description BLOOD LEFT ARM  Final   Special Requests   Final    BOTTLES DRAWN AEROBIC AND ANAEROBIC Blood Culture adequate volume   Culture   Final    NO GROWTH 74 DAYS Performed at [facility], [address], [place], [place] [address]    Report Status 03/19/2018 FINAL  Final   Studies/Results: Ct Chest W Contrast  Result Date: 03/23/2018 CLINICAL DATA:   Pneumonia, shortness of breath and cough worsening over last 2-3 days; history COPD, coronary artery disease post MI and coronary stenting, hypertension EXAM: CT CHEST WITH CONTRAST TECHNIQUE: Multidetector CT imaging of the chest was performed during intravenous contrast administration. Sagittal and coronal MPR images reconstructed from axial data set. CONTRAST:  58m OMNIPAQUE IOHEXOL 300 MG/ML  SOLN IV COMPARISON:  04/29/2017, 06/02/2017 PET-CT FINDINGS: Cardiovascular: Atherosclerotic calcifications aorta, proximal great vessels and coronary arteries. Aorta normal caliber. Heart unremarkable. No pericardial effusion. Mediastinum/Nodes: Esophagus normal appearance. Base of cervical region normal appearance. No thoracic adenopathy. Scattered normal size mediastinal lymph nodes identified Lungs/Pleura: Severe emphysematous changes consistent with COPD. Ovoid nodular density LEFT upper lobe 19 x 9 mm, slightly decreased, non FDG avid on prior PET exam, benign. Central peribronchial thickening. 7 mm RIGHT upper lobe nodular density image 23, stable. Scattered subpleural interstitial changes and interseptal thickening in the periphery of the lower lungs slightly greater on LEFT again seen. Scattered opacities are seen in lower lobes bilaterally, ill-defined, largest 15 x 10 mm LEFT lower lobe centrally image 129, favor atelectasis or subtle infiltrate. Additional stellate areas of density are seen in the lingula and LEFT lower lobe, also progressive. No pleural effusion or pneumothorax. Upper Abdomen: Atrophic RIGHT kidney with tiny cysts at upper pole. Remaining visualized upper abdomen unremarkable. Musculoskeletal: No acute osseous findings. Bones appear demineralized. IMPRESSION: Severe emphysematous changes consistent with COPD. Slight decrease in size of large LEFT upper lobe nodular density, which was FDG negative. Stable 7 mm RIGHT upper lobe nodular density. New poorly defined areas of opacity in BILATERAL  lower lobes and lingula, likely representing areas of infection or atelectasis, less likely developing scarring, some which are new since the previous exam; recommend follow-up CT chest in 3 months to ensure resolution and exclude developing pulmonary masses. New areas of parenchymal opacity in the lower lobes and lingula, question representing Aortic Atherosclerosis (ICD10-I70.0) and Emphysema (ICD10-J43.9). Electronically Signed   By: MLavonia DanaM.D.   On: 03/23/2018 18:34    Medications:  Prior to Admission:  Medications Prior to Admission  Medication Sig Dispense Refill Last Dose  . albuterol (PROVENTIL HFA;VENTOLIN HFA) 108 (90 Base) MCG/ACT inhaler Inhale 2 puffs into the lungs every 6 (six) hours as needed for wheezing or shortness of breath.   03/18/2018 at Unknown time  . amLODipine (NORVASC) 10 MG tablet Take 10 mg by mouth daily.   03/18/2018 at Unknown time  . aspirin EC 81 MG tablet Take  81 mg by mouth every morning.    03/18/2018 at Unknown time  . budesonide-formoterol (SYMBICORT) 80-4.5 MCG/ACT inhaler Inhale 2 puffs into the lungs 2 (two) times daily.   03/18/2018 at Unknown time  . cetirizine (ZYRTEC) 10 MG tablet Take 10 mg by mouth daily.   03/18/2018 at Unknown time  . fluticasone (FLONASE) 50 MCG/ACT nasal spray Place 2 sprays into both nostrils daily.  4 03/17/2018 at Unknown time  . lisinopril (PRINIVIL,ZESTRIL) 40 MG tablet Take 1 tablet (40 mg total) by mouth daily. 90 tablet 3 03/18/2018 at Unknown time  . metoprolol (LOPRESSOR) 100 MG tablet TAKE 1 TABLET (100 MG TOTAL) BY MOUTH 2 (TWO) TIMES DAILY. 60 tablet 3 03/18/2018 at Unknown time  . mirtazapine (REMERON) 7.5 MG tablet every morning.   03/18/2018 at Unknown time  . Multiple Vitamin (MULTIVITAMIN) tablet Take 1 tablet by mouth daily.   Past Month at Unknown time  . oral electrolytes (THERMOTABS) TABS tablet Take 1 tablet by mouth daily.   03/18/2018 at Unknown time  . pantoprazole (PROTONIX) 40 MG tablet Take 40 mg by mouth  every morning.    03/18/2018 at Unknown time  . Potassium 99 MG TABS Take 1 tablet by mouth daily.   Past Month at Unknown time  . rosuvastatin (CRESTOR) 40 MG tablet Take 40 mg by mouth daily after supper.   Past Week at Unknown time  . tiotropium (SPIRIVA) 18 MCG inhalation capsule Place 18 mcg into inhaler and inhale daily.   03/18/2018 at Unknown time   Scheduled: . acetylcysteine  3 mL Nebulization Q4H  . amLODipine  10 mg Oral Daily  . amoxicillin-clavulanate  1 tablet Oral Q12H  . aspirin EC  81 mg Oral q morning - 10a  . budesonide (PULMICORT) nebulizer solution  0.5 mg Nebulization BID  . enoxaparin (LOVENOX) injection  40 mg Subcutaneous Q24H  . feeding supplement (ENSURE ENLIVE)  237 mL Oral BID BM  . fluticasone  2 spray Each Nare Daily  . folic acid  1 mg Oral Daily  . guaiFENesin  1,200 mg Oral BID  . ipratropium-albuterol  3 mL Nebulization Q4H  . lisinopril  40 mg Oral Daily  . loratadine  10 mg Oral Daily  . mouth rinse  15 mL Mouth Rinse BID  . methylPREDNISolone (SOLU-MEDROL) injection  60 mg Intravenous Q8H  . metoprolol tartrate  100 mg Oral BID  . mirtazapine  7.5 mg Oral q morning - 10a  . multivitamin with minerals  1 tablet Oral Daily  . pantoprazole  40 mg Oral q morning - 10a  . rosuvastatin  40 mg Oral QPC supper  . thiamine  100 mg Oral Daily   Or  . thiamine  100 mg Intravenous Daily   Continuous: . sodium chloride     ZDG:LOVFIE chloride, acetaminophen **OR** acetaminophen, diphenhydrAMINE  Assesment: He has community-acquired pneumonia and that appears to be multilobar based on CT.  I think this is why he is had a slow recovery.  He has severe COPD/emphysema seen on his CT as well.  He has pulmonary nodules bilaterally the one on the left is smaller and the one on the right is stable.  He has history of alcohol abuse and he has been on protocol for that.  He has protein calorie malnutrition. Active Problems:   Hypertension   Alcohol abuse   CAP  (community acquired pneumonia)   Protein-calorie malnutrition, severe    Plan: Continue current treatments.  Try to  get him up and ambulating more and see what happens with his oxygenation.    LOS: 5 days   Levy Wellman L 03/24/2018, 9:00 AM

## 2018-03-24 NOTE — Progress Notes (Signed)
PROGRESS NOTE  Vincent Gilbert TZG:[id number] DOB: 09-14-1946 DOA: 03/18/2018 PCP: Glenda Chroman, MD  Brief History:  72 y.o.male,with history of hypertension, MI, stroke, chronic respiratory failure on 2 L oxygen at home came to hospital with worsening shortness of breath for past 2 days. Patient says that he has been coughing but unable to cough up any phlegm. He denies fever or chills. Denies nausea vomiting or diarrhea.  CXR demonstrated emphysematous changes and acute bibasilar airsapce disease. Admitted for further management of COPD exacerbation and PNA.    Assessment/Plan: Acute on chronic respiratory failure with hypoxia -Secondary to COPD exacerbation and pneumonia -The patient has been slow to improve -Continue weaning oxygen -presently stable on 3 L -wean oxygen to keep sat 88-92%  COPD exacerbation -Continue Pulmicort -Continue duo nebs -Add Brovana -Continue amox/clav  Lobar pneumonia -Initially started on ceftriaxone and azithromycin -Continue day #6 antibiotics  Essential hypertension -Continue amlodipine and lisinopril and metoprolol tartrate -controlled  Severe malnutrition -Continue Ensure  Hyperlipidemia  -Continue statin  Alcohol abuse -No evidence of withdrawal -continue CIWA protocol   Disposition Plan:   Home in 2-3 days  Family Communication:   Family at bedside updated 7/3  Consultants: pulmonary   Code Status:  FULL   DVT Prophylaxis:  [place] Lovenox   Procedures: As Listed in Progress Note Above  Antibiotics: Amox/clav 6/29>>> Ceftriaxone 6/28 azithro 6/28    Subjective: Patient states that he is breathing 15 to 20% better.  He still has dyspnea with mild exertion.  He denies any chest pain, nausea, vomiting, diarrhea, abdominal pain, dysuria, hematuria.  Objective: Vitals:   03/24/18 0243 03/24/18 0324 03/24/18 0753 03/24/18 0803  BP: 116/77     Pulse: 75     Resp: 18     Temp: 98.4 F (36.9 C)       TempSrc: Oral     SpO2: 98% 98% 91% 96%  Weight:      Height:        Intake/Output Summary (Last 24 hours) at 03/24/2018 1456 Last data filed at 03/24/2018 1326 Gross per 24 hour  Intake 1320 ml  Output 1550 ml  Net -230 ml   Weight change:  Exam:   General:  Pt is alert, follows commands appropriately, not in acute distress  HEENT: No icterus, No thrush, No neck mass, [place]/AT  Cardiovascular: RRR, S1/S2, no rubs, no gallops  Respiratory: Diminished breath sounds bilateral.  Bibasilar rales.  Mild expiratory wheeze.  Abdomen: Soft/+BS, non tender, non distended, no guarding  Extremities: No edema, No lymphangitis, No petechiae, No rashes, no synovitis   Data Reviewed: I have personally reviewed following labs and imaging studies Basic Metabolic Panel: Recent Labs  Lab 03/18/18 2310 03/19/18 0507 03/20/18 0632 03/24/18 0548  NA 127* 132* 133* 133*  K 3.6 3.7 3.9 4.4  CL 86* 92* 92* 85*  CO2 33* 32 35* 41*  GLUCOSE 119* 141* 164* 139*  BUN 6* 6* 11 16  CREATININE 0.52* 0.59* 0.62 0.65  CALCIUM 8.9 8.1* 8.2* 8.2*   Liver Function Tests: Recent Labs  Lab 03/19/18 0507  AST 18  ALT 9  ALKPHOS 65  BILITOT 0.6  PROT 6.1*  ALBUMIN 3.1*   No results for input(s): LIPASE, AMYLASE in the last 168 hours. No results for input(s): AMMONIA in the last 168 hours. Coagulation Profile: No results for input(s): INR, PROTIME in the last 168 hours. CBC: Recent Labs  Lab 03/18/18  2310 03/19/18 0507 03/20/18 0632 03/24/18 0548  WBC 16.2* 17.3* 24.8* 7.4  HGB 14.2 12.2* 11.3* 12.1*  HCT 41.3 36.1* 34.2* 36.7*  MCV 98.6 99.2 100.6* 101.1*  PLT 314 280 303 297   Cardiac Enzymes: No results for input(s): CKTOTAL, CKMB, CKMBINDEX, TROPONINI in the last 168 hours. BNP: Invalid input(s): POCBNP CBG: No results for input(s): GLUCAP in the last 168 hours. HbA1C: No results for input(s): HGBA1C in the last 72 hours. Urine analysis:    Component Value Date/Time    COLORURINE YELLOW 03/10/2012 1312   APPEARANCEUR CLEAR 03/10/2012 1312   LABSPEC 1.017 03/10/2012 1312   PHURINE 6.5 03/10/2012 1312   GLUCOSEU NEGATIVE 03/10/2012 1312   HGBUR NEGATIVE 03/10/2012 1312   BILIRUBINUR NEGATIVE 03/10/2012 1312   KETONESUR NEGATIVE 03/10/2012 1312   PROTEINUR 100 (A) 03/10/2012 1312   UROBILINOGEN 1.0 03/10/2012 1312   NITRITE NEGATIVE 03/10/2012 1312   LEUKOCYTESUR NEGATIVE 03/10/2012 1312   Sepsis Labs: @LABRCNTIP (procalcitonin:4,lacticidven:4) ) Recent Results (from the past 240 hour(s))  Culture, blood (Routine X 2) w Reflex to ID Panel     Status: None   Collection Time: 03/18/18 11:55 PM  Result Value Ref Range Status   Specimen Description BLOOD LEFT ARM  Final   Special Requests   Final    BOTTLES DRAWN AEROBIC AND ANAEROBIC Blood Culture adequate volume   Culture   Final    NO GROWTH 5 DAYS Performed at [facility], [address], [place], [place] [address]    Report Status 03/24/2018 FINAL  Final  Culture, blood (Routine X 2) w Reflex to ID Panel     Status: None   Collection Time: 03/19/18 12:04 AM  Result Value Ref Range Status   Specimen Description BLOOD LEFT ARM  Final   Special Requests   Final    BOTTLES DRAWN AEROBIC AND ANAEROBIC Blood Culture adequate volume   Culture   Final    NO GROWTH 84 DAYS Performed at [facility], [address] [place], [place] [address]    Report Status 03/19/2018 FINAL  Final     Scheduled Meds: . acetylcysteine  3 mL Nebulization Q4H  . amLODipine  10 mg Oral Daily  . amoxicillin-clavulanate  1 tablet Oral Q12H  . aspirin EC  81 mg Oral q morning - 10a  . budesonide (PULMICORT) nebulizer solution  0.5 mg Nebulization BID  . enoxaparin (LOVENOX) injection  40 mg Subcutaneous Q24H  . feeding supplement (ENSURE ENLIVE)  237 mL Oral BID BM  . fluticasone  2 spray Each Nare Daily  . folic acid  1 mg Oral Daily  . guaiFENesin  1,200 mg Oral BID  . ipratropium-albuterol  3 mL Nebulization Q4H   . lisinopril  40 mg Oral Daily  . loratadine  10 mg Oral Daily  . mouth rinse  15 mL Mouth Rinse BID  . methylPREDNISolone (SOLU-MEDROL) injection  60 mg Intravenous Q8H  . metoprolol tartrate  100 mg Oral BID  . mirtazapine  7.5 mg Oral q morning - 10a  . multivitamin with minerals  1 tablet Oral Daily  . pantoprazole  40 mg Oral q morning - 10a  . rosuvastatin  40 mg Oral QPC supper  . thiamine  100 mg Oral Daily   Or  . thiamine  100 mg Intravenous Daily   Continuous Infusions: . sodium chloride      Procedures/Studies: Ct Chest W Contrast  Addendum Date: 03/24/2018   ADDENDUM REPORT: 03/24/2018 09:12 ADDENDUM: The  sentence in the impression stating: "New areas of parenchymal opacity in the lower lobes and lingula, question representing" should be deleted from the report. Findings and recommendation for the lungs are included preceding sentence. Electronically Signed   By: Lavonia Dana M.D.   On: 03/24/2018 09:12   Result Date: 03/24/2018 CLINICAL DATA:  Pneumonia, shortness of breath and cough worsening over last 2-3 days; history COPD, coronary artery disease post MI and coronary stenting, hypertension EXAM: CT CHEST WITH CONTRAST TECHNIQUE: Multidetector CT imaging of the chest was performed during intravenous contrast administration. Sagittal and coronal MPR images reconstructed from axial data set. CONTRAST:  45mL OMNIPAQUE IOHEXOL 300 MG/ML  SOLN IV COMPARISON:  04/29/2017, 06/02/2017 PET-CT FINDINGS: Cardiovascular: Atherosclerotic calcifications aorta, proximal great vessels and coronary arteries. Aorta normal caliber. Heart unremarkable. No pericardial effusion. Mediastinum/Nodes: Esophagus normal appearance. Base of cervical region normal appearance. No thoracic adenopathy. Scattered normal size mediastinal lymph nodes identified Lungs/Pleura: Severe emphysematous changes consistent with COPD. Ovoid nodular density LEFT upper lobe 19 x 9 mm, slightly decreased, non FDG avid on prior  PET exam, benign. Central peribronchial thickening. 7 mm RIGHT upper lobe nodular density image 23, stable. Scattered subpleural interstitial changes and interseptal thickening in the periphery of the lower lungs slightly greater on LEFT again seen. Scattered opacities are seen in lower lobes bilaterally, ill-defined, largest 15 x 10 mm LEFT lower lobe centrally image 129, favor atelectasis or subtle infiltrate. Additional stellate areas of density are seen in the lingula and LEFT lower lobe, also progressive. No pleural effusion or pneumothorax. Upper Abdomen: Atrophic RIGHT kidney with tiny cysts at upper pole. Remaining visualized upper abdomen unremarkable. Musculoskeletal: No acute osseous findings. Bones appear demineralized. IMPRESSION: Severe emphysematous changes consistent with COPD. Slight decrease in size of large LEFT upper lobe nodular density, which was FDG negative. Stable 7 mm RIGHT upper lobe nodular density. New poorly defined areas of opacity in BILATERAL lower lobes and lingula, likely representing areas of infection or atelectasis, less likely developing scarring, some which are new since the previous exam; recommend follow-up CT chest in 3 months to ensure resolution and exclude developing pulmonary masses. New areas of parenchymal opacity in the lower lobes and lingula, question representing Aortic Atherosclerosis (ICD10-I70.0) and Emphysema (ICD10-J43.9). Electronically Signed: By: Lavonia Dana M.D. On: 03/23/2018 18:34   Dg Chest Portable 1 View  Result Date: 03/18/2018 CLINICAL DATA:  Dyspnea and chest tightness with cough over the past few days. EXAM: PORTABLE CHEST 1 VIEW COMPARISON:  PET CT 06/02/2017, CXR 04/28/2017 FINDINGS: Emphysematous hyperinflation of the lungs with bibasilar atelectasis. Superimposed small foci of pneumonia is not entirely excluded. Ovoid nodule projecting over the left upper lobe is stable estimated at 19 x 11 mm radiographically versus 17 x 11 mm prior  PET-CT. Heart size is within normal limits. Aortic atherosclerosis without aneurysmal dilatation is seen. IMPRESSION: 1. Emphysematous hyperinflation of the lungs with bibasilar airspace disease compatible with pneumonia and/or atelectasis. 2. Stable ovoid nodule in the left upper lobe measuring 19 x 11 mm. 3. Aortic atherosclerosis. Electronically Signed   By: Ashley Royalty M.D.   On: 03/18/2018 23:31    Orson Eva, DO  [facility] Pager [phone]  If 7PM-7AM, please contact night-coverage [email or web address] Password [email or web address] 03/24/2018, 2:56 PM   LOS: 5 days

## 2018-03-25 MED ORDER — ENSURE ENLIVE PO LIQD: 237.0000 mL | Freq: Two times a day (BID) | ORAL | 0 refills | Status: AC

## 2018-03-25 NOTE — Evaluation (Signed)
Physical Therapy Evaluation Patient Details Name: Vincent Gilbert MRN: [id number] DOB: 1946/04/16 Today's Date: 03/25/2018   History of Present Illness  Vincent Gilbert  is a 72 y.o. male, with history of hypertension, MI, stroke, chronic respiratory failure on 2 L oxygen at home came to hospital with worsening shortness of breath for past 2 days.  Patient says that he has been coughing but unable to cough up any phlegm.  He denies fever or chills.  Denies nausea vomiting or diarrhea.  Patient was given Solu-Medrol by EMS in route, chest x-ray in the ED shows emphysematous hyperinflation of lungs with bibasilar airspace disease compatible with pneumonia and/atelectasis.    Clinical Impression  Patient was found awake and alert at the sink in the hospital room. Patient returned to bed and demonstrated ability to to perform bed mobility independently. Patient performed sit to stand at the bed independently. Patient then ambulated 100 feet without assistive device using 3 L O2 via nasal cannula. Patient's O2 saturation before beginning activity was found to be 96% on 3 L O2 via nasal cannula, then was 87% after walking 50 feet. Patient had a sitting rest break in which his O2 saturation reached 96% before continuing with ambulation to return to his room. Patient was left sitting at the edge of the bed with call bell and phone within reach. Patient's O2 Saturation was found to be 100% at the end of the session. Patient would benefit from continued skilled physical therapy in order to improve patient's strength, endurance, and overall functional mobility.      Follow Up Recommendations Home health PT;Supervision - Intermittent    Equipment Recommendations       Recommendations for Other Services       Precautions / Restrictions Precautions Precautions: Fall Restrictions Weight Bearing Restrictions: No      Mobility  Bed Mobility Overal bed mobility: Independent             General  bed mobility comments: Patient able to roll and sit up in bed without assistance.  Transfers Overall transfer level: Independent Equipment used: None             General transfer comment: Patient performed sit to stand without assistance.  Ambulation/Gait Ambulation/Gait assistance: Supervision Gait Distance (Feet): 100 Feet Assistive device: None Gait Pattern/deviations: Step-through pattern;Decreased stride length Gait velocity: Patient demonstrated decreased gait velocity.   General Gait Details: Patient demonstrated decreased gait velocity and decreased endurance with ambulation.  Stairs            Wheelchair Mobility    Modified Rankin (Stroke Patients Only)       Balance Overall balance assessment: Independent                                           Pertinent Vitals/Pain Pain Assessment: No/denies pain    Home Living Family/patient expects to be discharged to:: Private residence Living Arrangements: Alone Available Help at Discharge: Family;Neighbor;Available PRN/intermittently Type of Home: House Home Access: Stairs to enter Entrance Stairs-Rails: None Entrance Stairs-Number of Steps: 2 Steps Home Layout: One level;Other (Comment)(1 step inside of house) Home Equipment: Grab bars - tub/shower;Hand held shower head;Cane - single point;Wheelchair - Insurance claims handler - standard      Prior Function Level of Independence: Independent         Comments: Patient reported he was able to walk at home,  sometimes needed breaks. Patient reported he was able to walk at the grocery store, but needed breaks.      Hand Dominance        Extremity/Trunk Assessment   Upper Extremity Assessment Upper Extremity Assessment: Generalized weakness    Lower Extremity Assessment Lower Extremity Assessment: Generalized weakness    Cervical / Trunk Assessment Cervical / Trunk Assessment: Kyphotic  Communication   Communication: No  difficulties  Cognition Arousal/Alertness: Awake/alert Behavior During Therapy: WFL for tasks assessed/performed Overall Cognitive Status: Within Functional Limits for tasks assessed                                 General Comments: Able to state full name and date of birth      General Comments General comments (skin integrity, edema, etc.): No noted interruptions to patient's skin integrity.    Exercises     Assessment/Plan    PT Assessment Patient needs continued PT services  PT Problem List Decreased strength;Decreased activity tolerance;Decreased mobility;Cardiopulmonary status limiting activity       PT Treatment Interventions DME instruction;Gait training;Stair training;Functional mobility training;Therapeutic activities;Therapeutic exercise;Balance training;Neuromuscular re-education;Patient/family education    PT Goals (Current goals can be found in the Care Plan section)  Acute Rehab PT Goals Patient Stated Goal: Patient would like to return home with possible therapy.  PT Goal Formulation: With patient Time For Goal Achievement: 04/01/18 Potential to Achieve Goals: Good    Frequency Min 3X/week   Barriers to discharge        Co-evaluation               AM-PAC PT "6 Clicks" Daily Activity  Outcome Measure Difficulty turning over in bed (including adjusting bedclothes, sheets and blankets)?: None Difficulty moving from lying on back to sitting on the side of the bed? : None Difficulty sitting down on and standing up from a chair with arms (e.g., wheelchair, bedside commode, etc,.)?: None Help needed moving to and from a bed to chair (including a wheelchair)?: A Little Help needed walking in hospital room?: A Little Help needed climbing 3-5 steps with a railing? : A Little 6 Click Score: 21    End of Session Equipment Utilized During Treatment: Gait belt;Oxygen;Other (comment)(3 L O2 via nasal cannula) Activity Tolerance: Patient  tolerated treatment well;Patient limited by fatigue;No increased pain Patient left: with call bell/phone within reach;Other (comment)(Sitting at edge of bed) Nurse Communication: Mobility status PT Visit Diagnosis: Unsteadiness on feet (R26.81);Other abnormalities of gait and mobility (R26.89);Muscle weakness (generalized) (M62.81)    Time: 1610-9604 PT Time Calculation (min) (ACUTE ONLY): 22 min   Charges:   PT Evaluation $PT Eval Low Complexity: 1 Low     PT G Codes:       Clarene Critchley PT, DPT 10:00 AM, 03/25/18 [phone]

## 2018-03-25 NOTE — Discharge Summary (Signed)
Physician Discharge Summary  Vincent Gilbert WUJ:[id number] DOB: 1946/02/19 DOA: 03/18/2018  PCP: Glenda Chroman, MD  Admit date: 03/18/2018 Discharge date: 03/27/2018  Admitted From:  HOME Disposition:  Home  Recommendations for Outpatient Follow-up:  1. Follow up with PCP in 1-2 weeks 2. Please obtain BMP/CBC in one week   Home Health: YES Equipment/Devices: HHPT  Discharge Condition: Stable CODE STATUS: FULL Diet recommendation: Heart Healthy   Brief/Interim Summary: 72 y.o.male,with history of hypertension, MI, stroke, chronic respiratory failure on 2 L oxygen at home came to hospital with worsening shortness of breath for past 2 days. Patient says that he has been coughing but unable to cough up any phlegm. He denies fever or chills. Denies nausea vomiting or diarrhea.  CXR demonstrated emphysematous changes and acute bibasilar airsapce disease. Admitted for further management of COPD exacerbation and PNA.  Pulmonary medicine was consulted to assist with management due to the patient's slow improvement.    The patient's bronchodilator therapy was adjusted.  He was continued on duo nebs every 4 hours with Brovana.  Pulmicort and Mucomyst were also added.    Discharge Diagnoses:  Acute on chronic respiratory failure with hypoxia -Secondary to COPD exacerbation and pneumonia -The patient has been slow to improve -Continue weaning oxygen -presently stable on 2 L  -pt is normally on 2-3 L at home at baseline -wean oxygen to keep sat 88-92%  COPD exacerbation -Continue Pulmicort -continue mucomyst -Continue duo nebs -Added Brovana -Continue amox/clav--finished 8 days abx during the hospitalization  Lobar pneumonia -Initially started on ceftriaxone and azithromycin -Finished day #8 antibiotics -03/23/2018 CT chest--severe emphysematous change, decreased size of LUL ovoid density, scattered bilateral subpleural interstitial changes and densities in the bilateral lower  lobes with interseptal thickening.  Essential hypertension -Continue amlodipine and lisinopril and metoprolol tartrate -controlled  Severe malnutrition -Continue Ensure  Hyperlipidemia -Continue statin  Alcohol abuse -No evidence of withdrawal -discontinue CIWA protocol  Diarrhea -check cdiff--neg     Discharge Instructions  Discharge Instructions    AMB Referral to [facility]   Complete by:  As directed    Please assign patient for community nurse to engage for transition of care calls and evaluate for monthly home visits. For questions please contact:   Janci Minor RN, [facility] Liaison [phone])   Reason for consult:  post hospital discharge follow up with [facility]   Diagnoses of:  COPD/ Pneumonia   Expected date of contact:  1-3 days (reserved for hospital discharges)     Allergies as of 03/27/2018      Reactions   Oxycodone    nausea   Codeine Palpitations      Medication List    TAKE these medications   albuterol 108 (90 Base) MCG/ACT inhaler Commonly known as:  PROVENTIL HFA;VENTOLIN HFA Inhale 2 puffs into the lungs every 6 (six) hours as needed for wheezing or shortness of breath.   amLODipine 10 MG tablet Commonly known as:  NORVASC Take 10 mg by mouth daily.   aspirin EC 81 MG tablet Take 81 mg by mouth every morning.   budesonide-formoterol 80-4.5 MCG/ACT inhaler Commonly known as:  SYMBICORT Inhale 2 puffs into the lungs 2 (two) times daily.   cetirizine 10 MG tablet Commonly known as:  ZYRTEC Take 10 mg by mouth daily.   feeding supplement (ENSURE ENLIVE) Liqd Take 237 mLs by mouth 2 (two) times daily between meals.   fluticasone 50 MCG/ACT nasal spray Commonly  known as:  FLONASE Place 2 sprays into both nostrils daily.   ipratropium-albuterol 0.5-2.5 (3) MG/3ML Soln Commonly known as:  DUONEB Take 3 mLs by nebulization every 6 (six) hours as needed (shortness of breath and  wheeze).   lisinopril 40 MG tablet Commonly known as:  PRINIVIL,ZESTRIL Take 1 tablet (40 mg total) by mouth daily.   magnesium oxide 400 (241.3 Mg) MG tablet Commonly known as:  MAG-OX Take 1 tablet (400 mg total) by mouth daily.   metoprolol tartrate 100 MG tablet Commonly known as:  LOPRESSOR TAKE 1 TABLET (100 MG TOTAL) BY MOUTH 2 (TWO) TIMES DAILY.   mirtazapine 7.5 MG tablet Commonly known as:  REMERON every morning.   multivitamin tablet Take 1 tablet by mouth daily.   oral electrolytes Tabs tablet Take 1 tablet by mouth daily.   pantoprazole 40 MG tablet Commonly known as:  PROTONIX Take 40 mg by mouth every morning.   Potassium 99 MG Tabs Take 1 tablet by mouth daily.   predniSONE 10 MG tablet Commonly known as:  DELTASONE Take 6 tablets (60 mg total) by mouth daily with breakfast. And decrease by one tablet daily Start taking on:  03/28/2018   rosuvastatin 40 MG tablet Commonly known as:  CRESTOR Take 40 mg by mouth daily after supper.   tiotropium 18 MCG inhalation capsule Commonly known as:  SPIRIVA Place 18 mcg into inhaler and inhale daily.      Follow-up Information    [facility].   Specialty:  Home Health Services Contact information: [place] [phone]          Allergies  Allergen Reactions  . Oxycodone     nausea  . Codeine Palpitations    Consultations: pulmonary  Procedures/Studies: Ct Chest W Contrast  Addendum Date: 03/24/2018   ADDENDUM REPORT: 03/24/2018 09:12 ADDENDUM: The sentence in the impression stating: "New areas of parenchymal opacity in the lower lobes and lingula, question representing" should be deleted from the report. Findings and recommendation for the lungs are included preceding sentence. Electronically Signed   By: Lavonia Dana M.D.   On: 03/24/2018 09:12   Result Date: 03/24/2018 CLINICAL DATA:  Pneumonia, shortness of breath and cough  worsening over last 2-3 days; history COPD, coronary artery disease post MI and coronary stenting, hypertension EXAM: CT CHEST WITH CONTRAST TECHNIQUE: Multidetector CT imaging of the chest was performed during intravenous contrast administration. Sagittal and coronal MPR images reconstructed from axial data set. CONTRAST:  26mL OMNIPAQUE IOHEXOL 300 MG/ML  SOLN IV COMPARISON:  04/29/2017, 06/02/2017 PET-CT FINDINGS: Cardiovascular: Atherosclerotic calcifications aorta, proximal great vessels and coronary arteries. Aorta normal caliber. Heart unremarkable. No pericardial effusion. Mediastinum/Nodes: Esophagus normal appearance. Base of cervical region normal appearance. No thoracic adenopathy. Scattered normal size mediastinal lymph nodes identified Lungs/Pleura: Severe emphysematous changes consistent with COPD. Ovoid nodular density LEFT upper lobe 19 x 9 mm, slightly decreased, non FDG avid on prior PET exam, benign. Central peribronchial thickening. 7 mm RIGHT upper lobe nodular density image 23, stable. Scattered subpleural interstitial changes and interseptal thickening in the periphery of the lower lungs slightly greater on LEFT again seen. Scattered opacities are seen in lower lobes bilaterally, ill-defined, largest 15 x 10 mm LEFT lower lobe centrally image 129, favor atelectasis or subtle infiltrate. Additional stellate areas of density are seen in the lingula and LEFT lower lobe, also progressive. No pleural effusion or pneumothorax. Upper Abdomen: Atrophic RIGHT kidney with tiny cysts  at upper pole. Remaining visualized upper abdomen unremarkable. Musculoskeletal: No acute osseous findings. Bones appear demineralized. IMPRESSION: Severe emphysematous changes consistent with COPD. Slight decrease in size of large LEFT upper lobe nodular density, which was FDG negative. Stable 7 mm RIGHT upper lobe nodular density. New poorly defined areas of opacity in BILATERAL lower lobes and lingula, likely  representing areas of infection or atelectasis, less likely developing scarring, some which are new since the previous exam; recommend follow-up CT chest in 3 months to ensure resolution and exclude developing pulmonary masses. New areas of parenchymal opacity in the lower lobes and lingula, question representing Aortic Atherosclerosis (ICD10-I70.0) and Emphysema (ICD10-J43.9). Electronically Signed: By: Lavonia Dana M.D. On: 03/23/2018 18:34   Dg Chest Portable 1 View  Result Date: 03/18/2018 CLINICAL DATA:  Dyspnea and chest tightness with cough over the past few days. EXAM: PORTABLE CHEST 1 VIEW COMPARISON:  PET CT 06/02/2017, CXR 04/28/2017 FINDINGS: Emphysematous hyperinflation of the lungs with bibasilar atelectasis. Superimposed small foci of pneumonia is not entirely excluded. Ovoid nodule projecting over the left upper lobe is stable estimated at 19 x 11 mm radiographically versus 17 x 11 mm prior PET-CT. Heart size is within normal limits. Aortic atherosclerosis without aneurysmal dilatation is seen. IMPRESSION: 1. Emphysematous hyperinflation of the lungs with bibasilar airspace disease compatible with pneumonia and/or atelectasis. 2. Stable ovoid nodule in the left upper lobe measuring 19 x 11 mm. 3. Aortic atherosclerosis. Electronically Signed   By: Ashley Royalty M.D.   On: 03/18/2018 23:31        Discharge Exam: Vitals:   03/27/18 0607 03/27/18 0733  BP: (!) 152/68   Pulse: 68   Resp: 15   Temp: 97.8 F (36.6 C)   SpO2: 100% 94%   Vitals:   03/27/18 0420 03/27/18 0606 03/27/18 0607 03/27/18 0733  BP:   (!) 152/68   Pulse:  68 68   Resp:   15   Temp:   97.8 F (36.6 C)   TempSrc:   Oral   SpO2: 96% 100% 100% 94%  Weight:   54 kg (119 lb 0.8 oz)   Height:        General: Pt is alert, awake, not in acute distress Cardiovascular: RRR, S1/S2 +, no rubs, no gallops Respiratory: diminished BS bilateral; scattered rales Abdominal: Soft, NT, ND, bowel sounds + Extremities: no  edema, no cyanosis   The results of significant diagnostics from this hospitalization (including imaging, microbiology, ancillary and laboratory) are listed below for reference.    Significant Diagnostic Studies: Ct Chest W Contrast  Addendum Date: 03/24/2018   ADDENDUM REPORT: 03/24/2018 09:12 ADDENDUM: The sentence in the impression stating: "New areas of parenchymal opacity in the lower lobes and lingula, question representing" should be deleted from the report. Findings and recommendation for the lungs are included preceding sentence. Electronically Signed   By: Lavonia Dana M.D.   On: 03/24/2018 09:12   Result Date: 03/24/2018 CLINICAL DATA:  Pneumonia, shortness of breath and cough worsening over last 2-3 days; history COPD, coronary artery disease post MI and coronary stenting, hypertension EXAM: CT CHEST WITH CONTRAST TECHNIQUE: Multidetector CT imaging of the chest was performed during intravenous contrast administration. Sagittal and coronal MPR images reconstructed from axial data set. CONTRAST:  61mL OMNIPAQUE IOHEXOL 300 MG/ML  SOLN IV COMPARISON:  04/29/2017, 06/02/2017 PET-CT FINDINGS: Cardiovascular: Atherosclerotic calcifications aorta, proximal great vessels and coronary arteries. Aorta normal caliber. Heart unremarkable. No pericardial effusion. Mediastinum/Nodes: Esophagus normal appearance. Base of cervical  region normal appearance. No thoracic adenopathy. Scattered normal size mediastinal lymph nodes identified Lungs/Pleura: Severe emphysematous changes consistent with COPD. Ovoid nodular density LEFT upper lobe 19 x 9 mm, slightly decreased, non FDG avid on prior PET exam, benign. Central peribronchial thickening. 7 mm RIGHT upper lobe nodular density image 23, stable. Scattered subpleural interstitial changes and interseptal thickening in the periphery of the lower lungs slightly greater on LEFT again seen. Scattered opacities are seen in lower lobes bilaterally, ill-defined, largest  15 x 10 mm LEFT lower lobe centrally image 129, favor atelectasis or subtle infiltrate. Additional stellate areas of density are seen in the lingula and LEFT lower lobe, also progressive. No pleural effusion or pneumothorax. Upper Abdomen: Atrophic RIGHT kidney with tiny cysts at upper pole. Remaining visualized upper abdomen unremarkable. Musculoskeletal: No acute osseous findings. Bones appear demineralized. IMPRESSION: Severe emphysematous changes consistent with COPD. Slight decrease in size of large LEFT upper lobe nodular density, which was FDG negative. Stable 7 mm RIGHT upper lobe nodular density. New poorly defined areas of opacity in BILATERAL lower lobes and lingula, likely representing areas of infection or atelectasis, less likely developing scarring, some which are new since the previous exam; recommend follow-up CT chest in 3 months to ensure resolution and exclude developing pulmonary masses. New areas of parenchymal opacity in the lower lobes and lingula, question representing Aortic Atherosclerosis (ICD10-I70.0) and Emphysema (ICD10-J43.9). Electronically Signed: By: Lavonia Dana M.D. On: 03/23/2018 18:34   Dg Chest Portable 1 View  Result Date: 03/18/2018 CLINICAL DATA:  Dyspnea and chest tightness with cough over the past few days. EXAM: PORTABLE CHEST 1 VIEW COMPARISON:  PET CT 06/02/2017, CXR 04/28/2017 FINDINGS: Emphysematous hyperinflation of the lungs with bibasilar atelectasis. Superimposed small foci of pneumonia is not entirely excluded. Ovoid nodule projecting over the left upper lobe is stable estimated at 19 x 11 mm radiographically versus 17 x 11 mm prior PET-CT. Heart size is within normal limits. Aortic atherosclerosis without aneurysmal dilatation is seen. IMPRESSION: 1. Emphysematous hyperinflation of the lungs with bibasilar airspace disease compatible with pneumonia and/or atelectasis. 2. Stable ovoid nodule in the left upper lobe measuring 19 x 11 mm. 3. Aortic  atherosclerosis. Electronically Signed   By: Ashley Royalty M.D.   On: 03/18/2018 23:31     Microbiology: Recent Results (from the past 240 hour(s))  Culture, blood (Routine X 2) w Reflex to ID Panel     Status: None   Collection Time: 03/18/18 11:55 PM  Result Value Ref Range Status   Specimen Description BLOOD LEFT ARM  Final   Special Requests   Final    BOTTLES DRAWN AEROBIC AND ANAEROBIC Blood Culture adequate volume   Culture   Final    NO GROWTH 5 DAYS Performed at [facility], [address] [place], [place] [address]    Report Status 03/24/2018 FINAL  Final  Culture, blood (Routine X 2) w Reflex to ID Panel     Status: None   Collection Time: 03/19/18 12:04 AM  Result Value Ref Range Status   Specimen Description BLOOD LEFT ARM  Final   Special Requests   Final    BOTTLES DRAWN AEROBIC AND ANAEROBIC Blood Culture adequate volume   Culture   Final    NO GROWTH 84 DAYS Performed at [facility], [address], [place], [place] [address]    Report Status 03/19/2018 FINAL  Final  C difficile quick scan w PCR reflex     Status: None   Collection  Time: 03/25/18  6:00 PM  Result Value Ref Range Status   C Diff antigen NEGATIVE NEGATIVE Final   C Diff toxin NEGATIVE NEGATIVE Final   C Diff interpretation No C. difficile detected.  Final    Comment: Performed at [facility], [address], [place], [place] [address]     Labs: Basic Metabolic Panel: Recent Labs  Lab 03/24/18 0548 03/26/18 0546 03/27/18 0723  NA 133*  --  135  K 4.4  --  3.8  CL 85*  --  83*  CO2 41*  --  43*  GLUCOSE 139*  --  122*  BUN 16  --  13  CREATININE 0.65 0.49* 0.56*  CALCIUM 8.2*  --  7.9*  MG  --   --  1.5*   Liver Function Tests: No results for input(s): AST, ALT, ALKPHOS, BILITOT, PROT, ALBUMIN in the last 168 hours. No results for input(s): LIPASE, AMYLASE in the last 168 hours. No results for input(s): AMMONIA in the last 168 hours. CBC: Recent Labs  Lab 03/24/18 0548  WBC  7.4  HGB 12.1*  HCT 36.7*  MCV 101.1*  PLT 297   Cardiac Enzymes: No results for input(s): CKTOTAL, CKMB, CKMBINDEX, TROPONINI in the last 168 hours. BNP: Invalid input(s): POCBNP CBG: Recent Labs  Lab 03/26/18 2205  GLUCAP 145*    Time coordinating discharge:  36 minutes  Signed:  Orson Eva, DO [facility] Pager: [phone] 03/27/2018, 11:58 AM

## 2018-03-25 NOTE — Progress Notes (Signed)
PROGRESS NOTE  Vincent Gilbert IHW:[id number] DOB: [birth year] DOA: 03/18/2018 PCP: Glenda Chroman, MD Brief History:  72 y.o.male,with history of hypertension, MI, stroke, chronic respiratory failure on 2 L oxygen at home came to hospital with worsening shortness of breath for past 2 days. Patient says that he has been coughing but unable to cough up any phlegm. He denies fever or chills. Denies nausea vomiting or diarrhea.  CXR demonstrated emphysematous changes and acute bibasilar airsapce disease. Admitted for further management of COPD exacerbation and PNA.  Pulmonary medicine was consulted to assist with management to the patient's slow improvement.      Assessment/Plan: Acute on chronic respiratory failure with hypoxia -Secondary to COPD exacerbation and pneumonia -The patient has been slow to improve -Continue weaning oxygen -presently stable on 2 L  -wean oxygen to keep sat 88-92%  COPD exacerbation -Continue Pulmicort -continue mucomyst -Continue duo nebs -Added Brovana -Continue amox/clav  Lobar pneumonia -Initially started on ceftriaxone and azithromycin -Continue day #7 antibiotics -03/23/2018 CT chest--severe emphysematous change, decreased size of LUL ovoid density, scattered bilateral subpleural interstitial changes and densities in the bilateral lower lobes with interseptal thickening.  Essential hypertension -Continue amlodipine and lisinopril and metoprolol tartrate -controlled  Severe malnutrition -Continue Ensure  Hyperlipidemia  -Continue statin  Alcohol abuse -No evidence of withdrawal -discontinue CIWA protocol  Diarrhea -check cdiff   Disposition Plan:   Home 7/5 or 7/6 Family Communication:   Family at bedside updated 7/3  Consultants: pulmonary   Code Status:  FULL   DVT Prophylaxis:  [place] Lovenox   Procedures: As Listed in Progress Note Above  Antibiotics: Amox/clav 6/29>>> Ceftriaxone 6/28 azithro  6/28       Subjective: He is breathing better, but continues to have dyspnea with mild exertions.  Patient denies fevers, chills, headache, chest pain,  nausea, vomiting, abdominal pain, dysuria, hematuria, hematochezia, and melena.  He has had 3 loose stools in past 24 hours.  No abd pain   Objective: Vitals:   03/25/18 1326 03/25/18 1326 03/25/18 1432 03/25/18 1510  BP:   136/63   Pulse: 78 77 77   Resp:   18   Temp:   97.8 F (36.6 C)   TempSrc:   Oral   SpO2: 99% 99% 97% 95%  Weight:      Height:        Intake/Output Summary (Last 24 hours) at 03/25/2018 1526 Last data filed at 03/25/2018 1022 Gross per 24 hour  Intake 720 ml  Output 700 ml  Net 20 ml   Weight change:  Exam:   General:  Pt is alert, follows commands appropriately, not in acute distress  HEENT: No icterus, No thrush, No neck mass, [place]/AT  Cardiovascular: RRR, S1/S2, no rubs, no gallops  Respiratory: diminished BS bilateral.  Scattered wheeze and rales bilateral  Abdomen: Soft/+BS, non tender, non distended, no guarding  Extremities: No edema, No lymphangitis, No petechiae, No rashes, no synovitis   Data Reviewed: I have personally reviewed following labs and imaging studies Basic Metabolic Panel: Recent Labs  Lab 03/18/18 2310 03/19/18 0507 03/20/18 0632 03/24/18 0548  NA 127* 132* 133* 133*  K 3.6 3.7 3.9 4.4  CL 86* 92* 92* 85*  CO2 33* 32 35* 41*  GLUCOSE 119* 141* 164* 139*  BUN 6* 6* 11 16  CREATININE 0.52* 0.59* 0.62 0.65  CALCIUM 8.9 8.1* 8.2* 8.2*   Liver Function Tests: Recent Labs  Lab  03/19/18 0507  AST 18  ALT 9  ALKPHOS 65  BILITOT 0.6  PROT 6.1*  ALBUMIN 3.1*   No results for input(s): LIPASE, AMYLASE in the last 168 hours. No results for input(s): AMMONIA in the last 168 hours. Coagulation Profile: No results for input(s): INR, PROTIME in the last 168 hours. CBC: Recent Labs  Lab 03/18/18 2310 03/19/18 0507 03/20/18 0632 03/24/18 0548  WBC 16.2*  17.3* 24.8* 7.4  HGB 14.2 12.2* 11.3* 12.1*  HCT 41.3 36.1* 34.2* 36.7*  MCV 98.6 99.2 100.6* 101.1*  PLT 314 280 303 297   Cardiac Enzymes: No results for input(s): CKTOTAL, CKMB, CKMBINDEX, TROPONINI in the last 168 hours. BNP: Invalid input(s): POCBNP CBG: No results for input(s): GLUCAP in the last 168 hours. HbA1C: No results for input(s): HGBA1C in the last 72 hours. Urine analysis:    Component Value Date/Time   COLORURINE YELLOW 03/10/2012 1312   APPEARANCEUR CLEAR 03/10/2012 1312   LABSPEC 1.017 03/10/2012 1312   PHURINE 6.5 03/10/2012 1312   GLUCOSEU NEGATIVE 03/10/2012 1312   HGBUR NEGATIVE 03/10/2012 1312   BILIRUBINUR NEGATIVE 03/10/2012 1312   KETONESUR NEGATIVE 03/10/2012 1312   PROTEINUR 100 (A) 03/10/2012 1312   UROBILINOGEN 1.0 03/10/2012 1312   NITRITE NEGATIVE 03/10/2012 1312   LEUKOCYTESUR NEGATIVE 03/10/2012 1312   Sepsis Labs: @LABRCNTIP (procalcitonin:4,lacticidven:4) ) Recent Results (from the past 240 hour(s))  Culture, blood (Routine X 2) w Reflex to ID Panel     Status: None   Collection Time: 03/18/18 11:55 PM  Result Value Ref Range Status   Specimen Description BLOOD LEFT ARM  Final   Special Requests   Final    BOTTLES DRAWN AEROBIC AND ANAEROBIC Blood Culture adequate volume   Culture   Final    NO GROWTH 5 DAYS Performed at [facility], [address] [place], [place] [address]    Report Status 03/24/2018 FINAL  Final  Culture, blood (Routine X 2) w Reflex to ID Panel     Status: None   Collection Time: 03/19/18 12:04 AM  Result Value Ref Range Status   Specimen Description BLOOD LEFT ARM  Final   Special Requests   Final    BOTTLES DRAWN AEROBIC AND ANAEROBIC Blood Culture adequate volume   Culture   Final    NO GROWTH 84 DAYS Performed at [facility], [address], [place], [place] [address]    Report Status 03/19/2018 FINAL  Final     Scheduled Meds: . acetylcysteine  3 mL Nebulization Q4H  . amLODipine  10 mg Oral  Daily  . amoxicillin-clavulanate  1 tablet Oral Q12H  . arformoterol  15 mcg Nebulization BID  . aspirin EC  81 mg Oral q morning - 10a  . budesonide (PULMICORT) nebulizer solution  0.5 mg Nebulization BID  . enoxaparin (LOVENOX) injection  40 mg Subcutaneous Q24H  . feeding supplement (ENSURE ENLIVE)  237 mL Oral BID BM  . fluticasone  2 spray Each Nare Daily  . folic acid  1 mg Oral Daily  . guaiFENesin  1,200 mg Oral BID  . ipratropium-albuterol  3 mL Nebulization Q4H  . lisinopril  40 mg Oral Daily  . loratadine  10 mg Oral Daily  . mouth rinse  15 mL Mouth Rinse BID  . methylPREDNISolone (SOLU-MEDROL) injection  60 mg Intravenous Q8H  . metoprolol tartrate  100 mg Oral BID  . mirtazapine  7.5 mg Oral q morning - 10a  . multivitamin with minerals  1 tablet  Oral Daily  . pantoprazole  40 mg Oral q morning - 10a  . rosuvastatin  40 mg Oral QPC supper  . thiamine  100 mg Oral Daily   Or  . thiamine  100 mg Intravenous Daily   Continuous Infusions: . sodium chloride      Procedures/Studies: Ct Chest W Contrast  Addendum Date: 03/24/2018   ADDENDUM REPORT: 03/24/2018 09:12 ADDENDUM: The sentence in the impression stating: "New areas of parenchymal opacity in the lower lobes and lingula, question representing" should be deleted from the report. Findings and recommendation for the lungs are included preceding sentence. Electronically Signed   By: Lavonia Dana M.D.   On: 03/24/2018 09:12   Result Date: 03/24/2018 CLINICAL DATA:  Pneumonia, shortness of breath and cough worsening over last 2-3 days; history COPD, coronary artery disease post MI and coronary stenting, hypertension EXAM: CT CHEST WITH CONTRAST TECHNIQUE: Multidetector CT imaging of the chest was performed during intravenous contrast administration. Sagittal and coronal MPR images reconstructed from axial data set. CONTRAST:  26mL OMNIPAQUE IOHEXOL 300 MG/ML  SOLN IV COMPARISON:  04/29/2017, 06/02/2017 PET-CT FINDINGS:  Cardiovascular: Atherosclerotic calcifications aorta, proximal great vessels and coronary arteries. Aorta normal caliber. Heart unremarkable. No pericardial effusion. Mediastinum/Nodes: Esophagus normal appearance. Base of cervical region normal appearance. No thoracic adenopathy. Scattered normal size mediastinal lymph nodes identified Lungs/Pleura: Severe emphysematous changes consistent with COPD. Ovoid nodular density LEFT upper lobe 19 x 9 mm, slightly decreased, non FDG avid on prior PET exam, benign. Central peribronchial thickening. 7 mm RIGHT upper lobe nodular density image 23, stable. Scattered subpleural interstitial changes and interseptal thickening in the periphery of the lower lungs slightly greater on LEFT again seen. Scattered opacities are seen in lower lobes bilaterally, ill-defined, largest 15 x 10 mm LEFT lower lobe centrally image 129, favor atelectasis or subtle infiltrate. Additional stellate areas of density are seen in the lingula and LEFT lower lobe, also progressive. No pleural effusion or pneumothorax. Upper Abdomen: Atrophic RIGHT kidney with tiny cysts at upper pole. Remaining visualized upper abdomen unremarkable. Musculoskeletal: No acute osseous findings. Bones appear demineralized. IMPRESSION: Severe emphysematous changes consistent with COPD. Slight decrease in size of large LEFT upper lobe nodular density, which was FDG negative. Stable 7 mm RIGHT upper lobe nodular density. New poorly defined areas of opacity in BILATERAL lower lobes and lingula, likely representing areas of infection or atelectasis, less likely developing scarring, some which are new since the previous exam; recommend follow-up CT chest in 3 months to ensure resolution and exclude developing pulmonary masses. New areas of parenchymal opacity in the lower lobes and lingula, question representing Aortic Atherosclerosis (ICD10-I70.0) and Emphysema (ICD10-J43.9). Electronically Signed: By: Lavonia Dana M.D. On:  03/23/2018 18:34   Dg Chest Portable 1 View  Result Date: 03/18/2018 CLINICAL DATA:  Dyspnea and chest tightness with cough over the past few days. EXAM: PORTABLE CHEST 1 VIEW COMPARISON:  PET CT 06/02/2017, CXR 04/28/2017 FINDINGS: Emphysematous hyperinflation of the lungs with bibasilar atelectasis. Superimposed small foci of pneumonia is not entirely excluded. Ovoid nodule projecting over the left upper lobe is stable estimated at 19 x 11 mm radiographically versus 17 x 11 mm prior PET-CT. Heart size is within normal limits. Aortic atherosclerosis without aneurysmal dilatation is seen. IMPRESSION: 1. Emphysematous hyperinflation of the lungs with bibasilar airspace disease compatible with pneumonia and/or atelectasis. 2. Stable ovoid nodule in the left upper lobe measuring 19 x 11 mm. 3. Aortic atherosclerosis. Electronically Signed   By: Ashley Royalty  M.D.   On: 03/18/2018 23:31    Orson Eva, DO  [facility] Pager [phone]  If 7PM-7AM, please contact night-coverage [email or web address] Password [email or web address] 03/25/2018, 3:26 PM   LOS: 6 days

## 2018-03-25 NOTE — Progress Notes (Signed)
Subjective: He says he feels better.  He was able to walk to the nurses station before he developed increased hypoxia today.  He is still coughing mostly nonproductively.  He has no other new complaints.  Objective: Vital signs in last 24 hours: Temp:  [97.6 F (36.4 C)-98.6 F (37 C)] 98.6 F (37 C) (07/04 0602) Pulse Rate:  [72-84] 72 (07/04 0602) Resp:  [20] 20 (07/04 0602) BP: (117-146)/(53-71) 146/61 (07/04 0602) SpO2:  [82 %-100 %] 98 % (07/04 0751) Weight change:  Last BM Date: (P) 03/25/18  Intake/Output from previous day: 07/03 0701 - 07/04 0700 In: 960 [P.O.:960] Out: 1000 [Urine:1000]  PHYSICAL EXAM General appearance: alert, cooperative and no distress Resp: rhonchi bilaterally Cardio: regular rate and rhythm, S1, S2 normal, no murmur, click, rub or gallop GI: soft, non-tender; bowel sounds normal; no masses,  no organomegaly Extremities: extremities normal, atraumatic, no cyanosis or edema  Lab Results:  Results for orders placed or performed during the hospital encounter of 03/18/18 (from the past 48 hour(s))  CBC     Status: Abnormal   Collection Time: 03/24/18  5:48 AM  Result Value Ref Range   WBC 7.4 4.0 - 10.5 K/uL   RBC 3.63 (L) 4.22 - 5.81 MIL/uL   Hemoglobin 12.1 (L) 13.0 - 17.0 g/dL   HCT 36.7 (L) 39.0 - 52.0 %   MCV 101.1 (H) 78.0 - 100.0 fL   MCH 33.3 26.0 - 34.0 pg   MCHC 33.0 30.0 - 36.0 g/dL   RDW 11.8 11.5 - 15.5 %   Platelets 297 150 - 400 K/uL    Comment: Performed at [facility], [address], [place], [place] [address]  Basic metabolic panel     Status: Abnormal   Collection Time: 03/24/18  5:48 AM  Result Value Ref Range   Sodium 133 (L) 135 - 145 mmol/L   Potassium 4.4 3.5 - 5.1 mmol/L   Chloride 85 (L) 98 - 111 mmol/L    Comment: Please note change in reference range.   CO2 41 (H) 22 - 32 mmol/L   Glucose, Bld 139 (H) 70 - 99 mg/dL    Comment: Please note change in reference range.   BUN 16 8 - 23 mg/dL    Comment: Please  note change in reference range.   Creatinine, Ser 0.65 0.61 - 1.24 mg/dL   Calcium 8.2 (L) 8.9 - 10.3 mg/dL   GFR calc non Af Amer >60 >60 mL/min   GFR calc Af Amer >60 >60 mL/min    Comment: (NOTE) The eGFR has been calculated using the CKD EPI equation. This calculation has not been validated in all clinical situations. eGFR's persistently <60 mL/min signify possible Chronic Kidney Disease.    Anion gap 7 5 - 15    Comment: Performed at [facility], [address] [place], [place] [address]    ABGS No results for input(s): PHART, PO2ART, TCO2, HCO3 in the last 72 hours.  Invalid input(s): PCO2 CULTURES Recent Results (from the past 240 hour(s))  Culture, blood (Routine X 2) w Reflex to ID Panel     Status: None   Collection Time: 03/18/18 11:55 PM  Result Value Ref Range Status   Specimen Description BLOOD LEFT ARM  Final   Special Requests   Final    BOTTLES DRAWN AEROBIC AND ANAEROBIC Blood Culture adequate volume   Culture   Final    NO GROWTH 5 DAYS Performed at [facility], [address] [place],  [place] [address]    Report Status 03/24/2018 FINAL  Final  Culture, blood (Routine X 2) w Reflex to ID Panel     Status: None   Collection Time: 03/19/18 12:04 AM  Result Value Ref Range Status   Specimen Description BLOOD LEFT ARM  Final   Special Requests   Final    BOTTLES DRAWN AEROBIC AND ANAEROBIC Blood Culture adequate volume   Culture   Final    NO GROWTH 84 DAYS Performed at [facility], [address], [place], [place] [address]    Report Status 03/19/2018 FINAL  Final   Studies/Results: Ct Chest W Contrast  Addendum Date: 03/24/2018   ADDENDUM REPORT: 03/24/2018 09:12 ADDENDUM: The sentence in the impression stating: "New areas of parenchymal opacity in the lower lobes and lingula, question representing" should be deleted from the report. Findings and recommendation for the lungs are included preceding sentence. Electronically Signed   By: Lavonia Dana M.D.    On: 03/24/2018 09:12   Result Date: 03/24/2018 CLINICAL DATA:  Pneumonia, shortness of breath and cough worsening over last 2-3 days; history COPD, coronary artery disease post MI and coronary stenting, hypertension EXAM: CT CHEST WITH CONTRAST TECHNIQUE: Multidetector CT imaging of the chest was performed during intravenous contrast administration. Sagittal and coronal MPR images reconstructed from axial data set. CONTRAST:  59m OMNIPAQUE IOHEXOL 300 MG/ML  SOLN IV COMPARISON:  04/29/2017, 06/02/2017 PET-CT FINDINGS: Cardiovascular: Atherosclerotic calcifications aorta, proximal great vessels and coronary arteries. Aorta normal caliber. Heart unremarkable. No pericardial effusion. Mediastinum/Nodes: Esophagus normal appearance. Base of cervical region normal appearance. No thoracic adenopathy. Scattered normal size mediastinal lymph nodes identified Lungs/Pleura: Severe emphysematous changes consistent with COPD. Ovoid nodular density LEFT upper lobe 19 x 9 mm, slightly decreased, non FDG avid on prior PET exam, benign. Central peribronchial thickening. 7 mm RIGHT upper lobe nodular density image 23, stable. Scattered subpleural interstitial changes and interseptal thickening in the periphery of the lower lungs slightly greater on LEFT again seen. Scattered opacities are seen in lower lobes bilaterally, ill-defined, largest 15 x 10 mm LEFT lower lobe centrally image 129, favor atelectasis or subtle infiltrate. Additional stellate areas of density are seen in the lingula and LEFT lower lobe, also progressive. No pleural effusion or pneumothorax. Upper Abdomen: Atrophic RIGHT kidney with tiny cysts at upper pole. Remaining visualized upper abdomen unremarkable. Musculoskeletal: No acute osseous findings. Bones appear demineralized. IMPRESSION: Severe emphysematous changes consistent with COPD. Slight decrease in size of large LEFT upper lobe nodular density, which was FDG negative. Stable 7 mm RIGHT upper lobe  nodular density. New poorly defined areas of opacity in BILATERAL lower lobes and lingula, likely representing areas of infection or atelectasis, less likely developing scarring, some which are new since the previous exam; recommend follow-up CT chest in 3 months to ensure resolution and exclude developing pulmonary masses. New areas of parenchymal opacity in the lower lobes and lingula, question representing Aortic Atherosclerosis (ICD10-I70.0) and Emphysema (ICD10-J43.9). Electronically Signed: By: MLavonia DanaM.D. On: 03/23/2018 18:34    Medications:  Prior to Admission:  Medications Prior to Admission  Medication Sig Dispense Refill Last Dose  . albuterol (PROVENTIL HFA;VENTOLIN HFA) 108 (90 Base) MCG/ACT inhaler Inhale 2 puffs into the lungs every 6 (six) hours as needed for wheezing or shortness of breath.   03/18/2018 at Unknown time  . amLODipine (NORVASC) 10 MG tablet Take 10 mg by mouth daily.   03/18/2018 at Unknown time  . aspirin EC 81 MG tablet Take  81 mg by mouth every morning.    03/18/2018 at Unknown time  . budesonide-formoterol (SYMBICORT) 80-4.5 MCG/ACT inhaler Inhale 2 puffs into the lungs 2 (two) times daily.   03/18/2018 at Unknown time  . cetirizine (ZYRTEC) 10 MG tablet Take 10 mg by mouth daily.   03/18/2018 at Unknown time  . fluticasone (FLONASE) 50 MCG/ACT nasal spray Place 2 sprays into both nostrils daily.  4 03/17/2018 at Unknown time  . lisinopril (PRINIVIL,ZESTRIL) 40 MG tablet Take 1 tablet (40 mg total) by mouth daily. 90 tablet 3 03/18/2018 at Unknown time  . metoprolol (LOPRESSOR) 100 MG tablet TAKE 1 TABLET (100 MG TOTAL) BY MOUTH 2 (TWO) TIMES DAILY. 60 tablet 3 03/18/2018 at Unknown time  . mirtazapine (REMERON) 7.5 MG tablet every morning.   03/18/2018 at Unknown time  . Multiple Vitamin (MULTIVITAMIN) tablet Take 1 tablet by mouth daily.   Past Month at Unknown time  . oral electrolytes (THERMOTABS) TABS tablet Take 1 tablet by mouth daily.   03/18/2018 at Unknown  time  . pantoprazole (PROTONIX) 40 MG tablet Take 40 mg by mouth every morning.    03/18/2018 at Unknown time  . Potassium 99 MG TABS Take 1 tablet by mouth daily.   Past Month at Unknown time  . rosuvastatin (CRESTOR) 40 MG tablet Take 40 mg by mouth daily after supper.   Past Week at Unknown time  . tiotropium (SPIRIVA) 18 MCG inhalation capsule Place 18 mcg into inhaler and inhale daily.   03/18/2018 at Unknown time   Scheduled: . acetylcysteine  3 mL Nebulization Q4H  . amLODipine  10 mg Oral Daily  . amoxicillin-clavulanate  1 tablet Oral Q12H  . arformoterol  15 mcg Nebulization BID  . aspirin EC  81 mg Oral q morning - 10a  . budesonide (PULMICORT) nebulizer solution  0.5 mg Nebulization BID  . enoxaparin (LOVENOX) injection  40 mg Subcutaneous Q24H  . feeding supplement (ENSURE ENLIVE)  237 mL Oral BID BM  . fluticasone  2 spray Each Nare Daily  . folic acid  1 mg Oral Daily  . guaiFENesin  1,200 mg Oral BID  . ipratropium-albuterol  3 mL Nebulization Q4H  . lisinopril  40 mg Oral Daily  . loratadine  10 mg Oral Daily  . mouth rinse  15 mL Mouth Rinse BID  . methylPREDNISolone (SOLU-MEDROL) injection  60 mg Intravenous Q8H  . metoprolol tartrate  100 mg Oral BID  . mirtazapine  7.5 mg Oral q morning - 10a  . multivitamin with minerals  1 tablet Oral Daily  . pantoprazole  40 mg Oral q morning - 10a  . rosuvastatin  40 mg Oral QPC supper  . thiamine  100 mg Oral Daily   Or  . thiamine  100 mg Intravenous Daily   Continuous: . sodium chloride     LEX:NTZGYF chloride, acetaminophen **OR** acetaminophen, diphenhydrAMINE  Assesment: He has bilateral pneumonia on chest CT.  He is improving.  He has acute on chronic hypoxic respiratory failure and that is also getting better.  He has pretty severe COPD at baseline but does not seem as short of breath as he was when I first saw him  He has protein calorie malnutrition and is on nutritional supplements Active Problems:    Hypertension   Alcohol abuse   CAP (community acquired pneumonia)   Protein-calorie malnutrition, severe   Acute on chronic respiratory failure with hypoxia ([place])   COPD with acute exacerbation ([place])  Plan: Continue treatments.  I think he is approaching discharge from a pulmonary point of view    LOS: 6 days   Lillymae Duet L 03/25/2018, 9:44 AM

## 2018-03-25 NOTE — Care Management Note (Signed)
Case Management Note  Patient Details  Name: Vincent Gilbert MRN: [id number] Date of Birth: May 30, 1946  Subjective/Objective:       Admitted with CAP. Pt from home, ind pta. Has continuous home oxygen pta.              Action/Plan: PT recommends HH PT. Pt agreeable. Pt chooses [facility] from list of [facility] providers. Aware that [facility] has 48 hrs to make first visit. Juliann Pulse, [facility] rep, aware of referral. [place] home tomorrow.    Expected Discharge Date:    03/26/18              Expected Discharge Plan:  [place]  In-House Referral:  NA  Discharge planning Services  CM Consult  Post Acute Care Choice:  Home Health Choice offered to:  Patient  DME Arranged:    DME Agency:     HH Arranged:  PT [place]:  [place]  Status of Service:  Completed, signed off  If discussed at [place] of Stay Meetings, dates discussed:    Additional Comments:  Sherald Barge, RN 03/25/2018, 10:37 AM

## 2018-03-25 NOTE — Plan of Care (Signed)
  Problem: Acute Rehab PT Goals(only PT should resolve) Goal: Patient Will Transfer Sit To/From Stand Outcome: Progressing Flowsheets (Taken 03/25/2018 1001) Patient will transfer sit to/from stand: Independently   Problem: Acute Rehab PT Goals(only PT should resolve) Goal: Pt Will Ambulate Outcome: Progressing Flowsheets (Taken 03/25/2018 1001) Pt will Ambulate: > 125 feet;Independently   Problem: Acute Rehab PT Goals(only PT should resolve) Goal: Pt Will Go Up/Down Stairs 03/25/2018 1002 by Clarene Critchley, PT Outcome: Progressing Flowsheets (Taken 03/25/2018 1002) Pt will Go Up / Down Stairs: 3-5 stairs;with modified independence 03/25/2018 1001 by Clarene Critchley, PT   Clarene Critchley PT, DPT 10:03 AM, 03/25/18 [phone]

## 2018-03-26 LAB — GLUCOSE, CAPILLARY: GLUCOSE-CAPILLARY: 145 mg/dL — AB (ref 70–99)

## 2018-03-26 LAB — CREATININE, SERUM: CREATININE: 0.49 mg/dL — AB (ref 0.61–1.24)

## 2018-03-26 LAB — C DIFFICILE QUICK SCREEN W PCR REFLEX
C DIFFICILE (CDIFF) INTERP: NOT DETECTED
C DIFFICILE (CDIFF) TOXIN: NEGATIVE
C Diff antigen: NEGATIVE

## 2018-03-26 MED ORDER — FUROSEMIDE 20 MG PO TABS
20.0000 mg | ORAL_TABLET | Freq: Once | ORAL | Status: AC
  Administered 2018-03-26: 20 mg via ORAL
  Filled 2018-03-26: qty 1

## 2018-03-26 NOTE — Care Management Important Message (Signed)
Important Message  Patient Details  Name: Vincent Gilbert MRN: [id number] Date of Birth: 10-24-1945   Medicare Important Message Given:  Yes    Deya Bigos, Chauncey [place], RN 03/26/2018, 12:01 PM

## 2018-03-26 NOTE — Progress Notes (Signed)
PROGRESS NOTE  MAXFIELD GILDERSLEEVE TIR:[id number] DOB: Jul 28, 1946 DOA: 03/18/2018 PCP: Glenda Chroman, MD  Brief History: 72 y.o.male,with history of hypertension, MI, stroke, chronic respiratory failure on 2 L oxygen at home came to hospital with worsening shortness of breath for past 2 days. Patient says that he has been coughing but unable to cough up any phlegm. He denies fever or chills. Denies nausea vomiting or diarrhea.  CXR demonstrated emphysematous changes and acute bibasilar airsapce disease. Admitted for further management of COPD exacerbation and PNA.  Pulmonary medicine was consulted to assist with management to the patient's slow improvement.      Assessment/Plan: Acute on chronic respiratory failure with hypoxia -Secondary to COPD exacerbation and pneumonia -The patient has been slow to improve -Continue weaning oxygen -normally on 2 L at home -presently stable on 2 L  -wean oxygen to keep sat 88-92%  COPD exacerbation -Continue Pulmicort -continue mucomyst -Continue duo nebs -Added Brovana -Continue amox/clav  Lobar pneumonia -Initially started on ceftriaxone and azithromycin -Continue day #8 antibiotics -03/23/2018 CT chest--severe emphysematous change, decreased size of LUL ovoid density, scattered bilateral subpleural interstitial changes and densities in the bilateral lower lobes with interseptal thickening.  Essential hypertension -Continue amlodipine and lisinopril and metoprolol tartrate -controlled  Severe malnutrition -Continue Ensure  Hyperlipidemia -Continue statin  Alcohol abuse -No evidence of withdrawal -discontinue CIWA protocol  Diarrhea -check cdiff--neg   Disposition Plan: Home 7/6 if stable Family Communication: Family at bedside updated 7/4  Consultants:pulmonary  Code Status: FULL   DVT Prophylaxis: [place] Lovenox   Procedures: As Listed in Progress Note  Above  Antibiotics: Amox/clav 6/29>>> Ceftriaxone 6/28 azithro 6/28     Subjective: Overall, patient is feeling better but still has dyspnea with mild exertion.  He denies any chest pain, nausea, vomiting.  He still has some loose stools but he denies any abdominal pain or dysuria, hematuria.  Objective: Vitals:   03/26/18 0952 03/26/18 1254 03/26/18 1349 03/26/18 1626  BP:   139/61   Pulse:   73   Resp:   18   Temp:   97.9 F (36.6 C)   TempSrc:   Oral   SpO2: 99% 98% 100% (!) 88%  Weight:      Height:        Intake/Output Summary (Last 24 hours) at 03/26/2018 1801 Last data filed at 03/26/2018 1000 Gross per 24 hour  Intake 480 ml  Output 500 ml  Net -20 ml   Weight change:  Exam:   General:  Pt is alert, follows commands appropriately, not in acute distress  HEENT: No icterus, No thrush, No neck mass, [place]/AT  Cardiovascular: RRR, S1/S2, no rubs, no gallops  Respiratory: Diminished breath sounds bilateral.  Minimal basilar wheezing.  Good air movement.  Scattered bilateral rales.  Abdomen: Soft/+BS, non tender, non distended, no guarding  Extremities: 1+ LE edema, No lymphangitis, No petechiae, No rashes, no synovitis   Data Reviewed: I have personally reviewed following labs and imaging studies Basic Metabolic Panel: Recent Labs  Lab 03/20/18 0632 03/24/18 0548 03/26/18 0546  NA 133* 133*  --   K 3.9 4.4  --   CL 92* 85*  --   CO2 35* 41*  --   GLUCOSE 164* 139*  --   BUN 11 16  --   CREATININE 0.62 0.65 0.49*  CALCIUM 8.2* 8.2*  --    Liver Function Tests: No results for input(s): AST, ALT,  ALKPHOS, BILITOT, PROT, ALBUMIN in the last 168 hours. No results for input(s): LIPASE, AMYLASE in the last 168 hours. No results for input(s): AMMONIA in the last 168 hours. Coagulation Profile: No results for input(s): INR, PROTIME in the last 168 hours. CBC: Recent Labs  Lab 03/20/18 0632 03/24/18 0548  WBC 24.8* 7.4  HGB 11.3* 12.1*  HCT 34.2*  36.7*  MCV 100.6* 101.1*  PLT 303 297   Cardiac Enzymes: No results for input(s): CKTOTAL, CKMB, CKMBINDEX, TROPONINI in the last 168 hours. BNP: Invalid input(s): POCBNP CBG: No results for input(s): GLUCAP in the last 168 hours. HbA1C: No results for input(s): HGBA1C in the last 72 hours. Urine analysis:    Component Value Date/Time   COLORURINE YELLOW 03/10/2012 1312   APPEARANCEUR CLEAR 03/10/2012 1312   LABSPEC 1.017 03/10/2012 1312   PHURINE 6.5 03/10/2012 1312   GLUCOSEU NEGATIVE 03/10/2012 1312   HGBUR NEGATIVE 03/10/2012 1312   BILIRUBINUR NEGATIVE 03/10/2012 1312   KETONESUR NEGATIVE 03/10/2012 1312   PROTEINUR 100 (A) 03/10/2012 1312   UROBILINOGEN 1.0 03/10/2012 1312   NITRITE NEGATIVE 03/10/2012 1312   LEUKOCYTESUR NEGATIVE 03/10/2012 1312   Sepsis Labs: @LABRCNTIP (procalcitonin:4,lacticidven:4) ) Recent Results (from the past 240 hour(s))  Culture, blood (Routine X 2) w Reflex to ID Panel     Status: None   Collection Time: 03/18/18 11:55 PM  Result Value Ref Range Status   Specimen Description BLOOD LEFT ARM  Final   Special Requests   Final    BOTTLES DRAWN AEROBIC AND ANAEROBIC Blood Culture adequate volume   Culture   Final    NO GROWTH 5 DAYS Performed at [facility], [address] [place], [place] [address]    Report Status 03/24/2018 FINAL  Final  Culture, blood (Routine X 2) w Reflex to ID Panel     Status: None   Collection Time: 03/19/18 12:04 AM  Result Value Ref Range Status   Specimen Description BLOOD LEFT ARM  Final   Special Requests   Final    BOTTLES DRAWN AEROBIC AND ANAEROBIC Blood Culture adequate volume   Culture   Final    NO GROWTH 84 DAYS Performed at [facility], [address], [place], [place] [address]    Report Status 03/19/2018 FINAL  Final  C difficile quick scan w PCR reflex     Status: None   Collection Time: 03/25/18  6:00 PM  Result Value Ref Range Status   C Diff antigen NEGATIVE NEGATIVE Final   C Diff  toxin NEGATIVE NEGATIVE Final   C Diff interpretation No C. difficile detected.  Final    Comment: Performed at [facility], [address] [place],  [address]     Scheduled Meds: . acetylcysteine  3 mL Nebulization Q4H  . amLODipine  10 mg Oral Daily  . amoxicillin-clavulanate  1 tablet Oral Q12H  . arformoterol  15 mcg Nebulization BID  . aspirin EC  81 mg Oral q morning - 10a  . budesonide (PULMICORT) nebulizer solution  0.5 mg Nebulization BID  . enoxaparin (LOVENOX) injection  40 mg Subcutaneous Q24H  . feeding supplement (ENSURE ENLIVE)  237 mL Oral BID BM  . fluticasone  2 spray Each Nare Daily  . folic acid  1 mg Oral Daily  . furosemide  20 mg Oral Once  . guaiFENesin  1,200 mg Oral BID  . ipratropium-albuterol  3 mL Nebulization Q4H  . lisinopril  40 mg Oral Daily  . loratadine  10  mg Oral Daily  . mouth rinse  15 mL Mouth Rinse BID  . methylPREDNISolone (SOLU-MEDROL) injection  60 mg Intravenous Q8H  . metoprolol tartrate  100 mg Oral BID  . mirtazapine  7.5 mg Oral q morning - 10a  . multivitamin with minerals  1 tablet Oral Daily  . pantoprazole  40 mg Oral q morning - 10a  . rosuvastatin  40 mg Oral QPC supper  . thiamine  100 mg Oral Daily   Or  . thiamine  100 mg Intravenous Daily   Continuous Infusions: . sodium chloride      Procedures/Studies: Ct Chest W Contrast  Addendum Date: 03/24/2018   ADDENDUM REPORT: 03/24/2018 09:12 ADDENDUM: The sentence in the impression stating: "New areas of parenchymal opacity in the lower lobes and lingula, question representing" should be deleted from the report. Findings and recommendation for the lungs are included preceding sentence. Electronically Signed   By: Lavonia Dana M.D.   On: 03/24/2018 09:12   Result Date: 03/24/2018 CLINICAL DATA:  Pneumonia, shortness of breath and cough worsening over last 2-3 days; history COPD, coronary artery disease post MI and coronary stenting, hypertension EXAM: CT CHEST WITH  CONTRAST TECHNIQUE: Multidetector CT imaging of the chest was performed during intravenous contrast administration. Sagittal and coronal MPR images reconstructed from axial data set. CONTRAST:  65mL OMNIPAQUE IOHEXOL 300 MG/ML  SOLN IV COMPARISON:  04/29/2017, 06/02/2017 PET-CT FINDINGS: Cardiovascular: Atherosclerotic calcifications aorta, proximal great vessels and coronary arteries. Aorta normal caliber. Heart unremarkable. No pericardial effusion. Mediastinum/Nodes: Esophagus normal appearance. Base of cervical region normal appearance. No thoracic adenopathy. Scattered normal size mediastinal lymph nodes identified Lungs/Pleura: Severe emphysematous changes consistent with COPD. Ovoid nodular density LEFT upper lobe 19 x 9 mm, slightly decreased, non FDG avid on prior PET exam, benign. Central peribronchial thickening. 7 mm RIGHT upper lobe nodular density image 23, stable. Scattered subpleural interstitial changes and interseptal thickening in the periphery of the lower lungs slightly greater on LEFT again seen. Scattered opacities are seen in lower lobes bilaterally, ill-defined, largest 15 x 10 mm LEFT lower lobe centrally image 129, favor atelectasis or subtle infiltrate. Additional stellate areas of density are seen in the lingula and LEFT lower lobe, also progressive. No pleural effusion or pneumothorax. Upper Abdomen: Atrophic RIGHT kidney with tiny cysts at upper pole. Remaining visualized upper abdomen unremarkable. Musculoskeletal: No acute osseous findings. Bones appear demineralized. IMPRESSION: Severe emphysematous changes consistent with COPD. Slight decrease in size of large LEFT upper lobe nodular density, which was FDG negative. Stable 7 mm RIGHT upper lobe nodular density. New poorly defined areas of opacity in BILATERAL lower lobes and lingula, likely representing areas of infection or atelectasis, less likely developing scarring, some which are new since the previous exam; recommend  follow-up CT chest in 3 months to ensure resolution and exclude developing pulmonary masses. New areas of parenchymal opacity in the lower lobes and lingula, question representing Aortic Atherosclerosis (ICD10-I70.0) and Emphysema (ICD10-J43.9). Electronically Signed: By: Lavonia Dana M.D. On: 03/23/2018 18:34   Dg Chest Portable 1 View  Result Date: 03/18/2018 CLINICAL DATA:  Dyspnea and chest tightness with cough over the past few days. EXAM: PORTABLE CHEST 1 VIEW COMPARISON:  PET CT 06/02/2017, CXR 04/28/2017 FINDINGS: Emphysematous hyperinflation of the lungs with bibasilar atelectasis. Superimposed small foci of pneumonia is not entirely excluded. Ovoid nodule projecting over the left upper lobe is stable estimated at 19 x 11 mm radiographically versus 17 x 11 mm prior PET-CT. Heart size is  within normal limits. Aortic atherosclerosis without aneurysmal dilatation is seen. IMPRESSION: 1. Emphysematous hyperinflation of the lungs with bibasilar airspace disease compatible with pneumonia and/or atelectasis. 2. Stable ovoid nodule in the left upper lobe measuring 19 x 11 mm. 3. Aortic atherosclerosis. Electronically Signed   By: Ashley Royalty M.D.   On: 03/18/2018 23:31    Orson Eva, DO  [facility] Pager [phone]  If 7PM-7AM, please contact night-coverage [email or web address] Password [email or web address] 03/26/2018, 6:01 PM   LOS: 7 days

## 2018-03-26 NOTE — Progress Notes (Signed)
Subjective: He says he feels okay.  He is having some diarrhea.  He is still coughing nonproductively.  His shortness of breath is better but he is still short of breath with exertion.  His oxygenation is better  Objective: Vital signs in last 24 hours: Temp:  [97.8 F (36.6 C)-98.4 F (36.9 C)] 98 F (36.7 C) (07/05 0412) Pulse Rate:  [77-79] 77 (07/05 0412) Resp:  [18-21] 20 (07/05 0412) BP: (133-140)/(58-63) 140/63 (07/05 0412) SpO2:  [92 %-100 %] 93 % (07/05 0738) Weight change:  Last BM Date: 03/25/18  Intake/Output from previous day: 07/04 0701 - 07/05 0700 In: 720 [P.O.:720] Out: 500 [Urine:500]  PHYSICAL EXAM General appearance: alert, cooperative and no distress Resp: rhonchi bilaterally Cardio: regular rate and rhythm, S1, S2 normal, no murmur, click, rub or gallop GI: soft, non-tender; bowel sounds normal; no masses,  no organomegaly Extremities: extremities normal, atraumatic, no cyanosis or edema  Lab Results:  Results for orders placed or performed during the hospital encounter of 03/18/18 (from the past 48 hour(s))  C difficile quick scan w PCR reflex     Status: None   Collection Time: 03/25/18  6:00 PM  Result Value Ref Range   C Diff antigen NEGATIVE NEGATIVE   C Diff toxin NEGATIVE NEGATIVE   C Diff interpretation No C. difficile detected.     Comment: Performed at [facility], [address] [place], [place] [address]  Creatinine, serum     Status: Abnormal   Collection Time: 03/26/18  5:46 AM  Result Value Ref Range   Creatinine, Ser 0.49 (L) 0.61 - 1.24 mg/dL   GFR calc non Af Amer >60 >60 mL/min   GFR calc Af Amer >60 >60 mL/min    Comment: (NOTE) The eGFR has been calculated using the CKD EPI equation. This calculation has not been validated in all clinical situations. eGFR's persistently <60 mL/min signify possible Chronic Kidney Disease. Performed at [facility], [address], [place], [place] [address]     ABGS No results for  input(s): PHART, PO2ART, TCO2, HCO3 in the last 72 hours.  Invalid input(s): PCO2 CULTURES Recent Results (from the past 240 hour(s))  Culture, blood (Routine X 2) w Reflex to ID Panel     Status: None   Collection Time: 03/18/18 11:55 PM  Result Value Ref Range Status   Specimen Description BLOOD LEFT ARM  Final   Special Requests   Final    BOTTLES DRAWN AEROBIC AND ANAEROBIC Blood Culture adequate volume   Culture   Final    NO GROWTH 5 DAYS Performed at [facility], [address], [place], [place] [address]    Report Status 03/24/2018 FINAL  Final  Culture, blood (Routine X 2) w Reflex to ID Panel     Status: None   Collection Time: 03/19/18 12:04 AM  Result Value Ref Range Status   Specimen Description BLOOD LEFT ARM  Final   Special Requests   Final    BOTTLES DRAWN AEROBIC AND ANAEROBIC Blood Culture adequate volume   Culture   Final    NO GROWTH 84 DAYS Performed at [facility], [address], [place], [place] [address]    Report Status 03/19/2018 FINAL  Final  C difficile quick scan w PCR reflex     Status: None   Collection Time: 03/25/18  6:00 PM  Result Value Ref Range Status   C Diff antigen NEGATIVE NEGATIVE Final   C Diff toxin NEGATIVE NEGATIVE Final  C Diff interpretation No C. difficile detected.  Final    Comment: Performed at [facility], [address] [place], [place] [address]   Studies/Results: No results found.  Medications:  Prior to Admission:  Medications Prior to Admission  Medication Sig Dispense Refill Last Dose  . albuterol (PROVENTIL HFA;VENTOLIN HFA) 108 (90 Base) MCG/ACT inhaler Inhale 2 puffs into the lungs every 6 (six) hours as needed for wheezing or shortness of breath.   03/18/2018 at Unknown time  . amLODipine (NORVASC) 10 MG tablet Take 10 mg by mouth daily.   03/18/2018 at Unknown time  . aspirin EC 81 MG tablet Take 81 mg by mouth every morning.    03/18/2018 at Unknown time  . budesonide-formoterol (SYMBICORT) 80-4.5 MCG/ACT  inhaler Inhale 2 puffs into the lungs 2 (two) times daily.   03/18/2018 at Unknown time  . cetirizine (ZYRTEC) 10 MG tablet Take 10 mg by mouth daily.   03/18/2018 at Unknown time  . fluticasone (FLONASE) 50 MCG/ACT nasal spray Place 2 sprays into both nostrils daily.  4 03/17/2018 at Unknown time  . lisinopril (PRINIVIL,ZESTRIL) 40 MG tablet Take 1 tablet (40 mg total) by mouth daily. 90 tablet 3 03/18/2018 at Unknown time  . metoprolol (LOPRESSOR) 100 MG tablet TAKE 1 TABLET (100 MG TOTAL) BY MOUTH 2 (TWO) TIMES DAILY. 60 tablet 3 03/18/2018 at Unknown time  . mirtazapine (REMERON) 7.5 MG tablet every morning.   03/18/2018 at Unknown time  . Multiple Vitamin (MULTIVITAMIN) tablet Take 1 tablet by mouth daily.   Past Month at Unknown time  . oral electrolytes (THERMOTABS) TABS tablet Take 1 tablet by mouth daily.   03/18/2018 at Unknown time  . pantoprazole (PROTONIX) 40 MG tablet Take 40 mg by mouth every morning.    03/18/2018 at Unknown time  . Potassium 99 MG TABS Take 1 tablet by mouth daily.   Past Month at Unknown time  . rosuvastatin (CRESTOR) 40 MG tablet Take 40 mg by mouth daily after supper.   Past Week at Unknown time  . tiotropium (SPIRIVA) 18 MCG inhalation capsule Place 18 mcg into inhaler and inhale daily.   03/18/2018 at Unknown time   Scheduled: . acetylcysteine  3 mL Nebulization Q4H  . amLODipine  10 mg Oral Daily  . amoxicillin-clavulanate  1 tablet Oral Q12H  . arformoterol  15 mcg Nebulization BID  . aspirin EC  81 mg Oral q morning - 10a  . budesonide (PULMICORT) nebulizer solution  0.5 mg Nebulization BID  . enoxaparin (LOVENOX) injection  40 mg Subcutaneous Q24H  . feeding supplement (ENSURE ENLIVE)  237 mL Oral BID BM  . fluticasone  2 spray Each Nare Daily  . folic acid  1 mg Oral Daily  . guaiFENesin  1,200 mg Oral BID  . ipratropium-albuterol  3 mL Nebulization Q4H  . lisinopril  40 mg Oral Daily  . loratadine  10 mg Oral Daily  . mouth rinse  15 mL Mouth Rinse BID   . methylPREDNISolone (SOLU-MEDROL) injection  60 mg Intravenous Q8H  . metoprolol tartrate  100 mg Oral BID  . mirtazapine  7.5 mg Oral q morning - 10a  . multivitamin with minerals  1 tablet Oral Daily  . pantoprazole  40 mg Oral q morning - 10a  . rosuvastatin  40 mg Oral QPC supper  . thiamine  100 mg Oral Daily   Or  . thiamine  100 mg Intravenous Daily   Continuous: . sodium chloride  XBD:ZHGDJM chloride, acetaminophen **OR** acetaminophen, diphenhydrAMINE  Assesment: He was admitted with community-acquired pneumonia which by CT is multilobar.  He is slowly improving.  At baseline he has severe COPD which is very limiting as far as his activities at home.  He has acute on chronic hypoxic respiratory failure and his oxygenation is improving.  He has severe protein calorie malnutrition and remains on nutritional supplements Active Problems:   Hypertension   Alcohol abuse   CAP (community acquired pneumonia)   Protein-calorie malnutrition, severe   Acute on chronic respiratory failure with hypoxia (HCC)   COPD with acute exacerbation ([place])    Plan: Continue treatments.  He is approaching discharge    LOS: 7 days   Vincent Gilbert L 03/26/2018, 9:02 AM

## 2018-03-27 LAB — BASIC METABOLIC PANEL
Anion gap: 9 (ref 5–15)
BUN: 13 mg/dL (ref 8–23)
CO2: 43 mmol/L — ABNORMAL HIGH (ref 22–32)
Calcium: 7.9 mg/dL — ABNORMAL LOW (ref 8.9–10.3)
Chloride: 83 mmol/L — ABNORMAL LOW (ref 98–111)
Creatinine, Ser: 0.56 mg/dL — ABNORMAL LOW (ref 0.61–1.24)
GFR calc Af Amer: >60 mL/min (ref >60)
GLUCOSE: 122 mg/dL — AB (ref 70–99)
POTASSIUM: 3.8 mmol/L (ref 3.5–5.1)
Sodium: 135 mmol/L (ref 135–145)

## 2018-03-27 LAB — MAGNESIUM: Magnesium: 1.5 mg/dL — ABNORMAL LOW (ref 1.7–2.4)

## 2018-03-27 MED ORDER — MAGNESIUM OXIDE 400 (241.3 MG) MG PO TABS: 400.0000 mg | ORAL_TABLET | Freq: Every day | ORAL | 0 refills | Status: DC

## 2018-03-27 MED ORDER — MAGNESIUM OXIDE 400 (241.3 MG) MG PO TABS
400.0000 mg | ORAL_TABLET | Freq: Every day | ORAL | Status: DC
  Administered 2018-03-27: 400 mg via ORAL
  Filled 2018-03-27: qty 1

## 2018-03-27 MED ORDER — PREDNISONE 10 MG PO TABS: 60.0000 mg | ORAL_TABLET | Freq: Every day | ORAL | 0 refills | Status: DC

## 2018-03-27 MED ORDER — FUROSEMIDE 20 MG PO TABS
20.0000 mg | ORAL_TABLET | Freq: Every day | ORAL | Status: DC
  Administered 2018-03-27: 20 mg via ORAL
  Filled 2018-03-27: qty 1

## 2018-03-27 MED ORDER — IPRATROPIUM-ALBUTEROL 0.5-2.5 (3) MG/3ML IN SOLN: 3.0000 mL | Freq: Four times a day (QID) | RESPIRATORY_TRACT | 0 refills | Status: DC | PRN

## 2018-03-27 MED ORDER — PREDNISONE 20 MG PO TABS
40.0000 mg | ORAL_TABLET | Freq: Every day | ORAL | Status: DC
  Administered 2018-03-27: 40 mg via ORAL
  Filled 2018-03-27: qty 2

## 2018-03-27 MED ORDER — MAGNESIUM SULFATE 2 GM/50ML IV SOLN
2.0000 g | Freq: Once | INTRAVENOUS | Status: DC
  Filled 2018-03-27: qty 50

## 2018-03-27 NOTE — Progress Notes (Signed)
Subjective: He says he feels okay.  He is still having trouble coughing up sputum.  He had some swelling of his legs yesterday and was given Lasix and he says he is urinating more but he still has some swelling of his legs.  He still has frequent stools.  He was able to ambulate yesterday with no difficulty  Objective: Vital signs in last 24 hours: Temp:  [97.5 F (36.4 C)-97.9 F (36.6 C)] 97.8 F (36.6 C) (07/06 0607) Pulse Rate:  [68-73] 68 (07/06 0607) Resp:  [15-18] 15 (07/06 0607) BP: (139-152)/(61-68) 152/68 (07/06 0607) SpO2:  [88 %-100 %] 94 % (07/06 0733) Weight:  [54 kg (119 lb 0.8 oz)] 54 kg (119 lb 0.8 oz) (07/06 5400) Weight change:  Last BM Date: 03/26/18  Intake/Output from previous day: 07/05 0701 - 07/06 0700 In: 240 [P.O.:240] Out: 200 [Urine:200]  PHYSICAL EXAM General appearance: alert, cooperative and no distress Resp: clear to auscultation bilaterally Cardio: regular rate and rhythm, S1, S2 normal, no murmur, click, rub or gallop GI: soft, non-tender; bowel sounds normal; no masses,  no organomegaly Extremities: extremities normal, atraumatic, no cyanosis or edema  Lab Results:  Results for orders placed or performed during the hospital encounter of 03/18/18 (from the past 48 hour(s))  C difficile quick scan w PCR reflex     Status: None   Collection Time: 03/25/18  6:00 PM  Result Value Ref Range   C Diff antigen NEGATIVE NEGATIVE   C Diff toxin NEGATIVE NEGATIVE   C Diff interpretation No C. difficile detected.     Comment: Performed at [facility], [address], [place], [place] [address]  Creatinine, serum     Status: Abnormal   Collection Time: 03/26/18  5:46 AM  Result Value Ref Range   Creatinine, Ser 0.49 (L) 0.61 - 1.24 mg/dL   GFR calc non Af Amer >60 >60 mL/min   GFR calc Af Amer >60 >60 mL/min    Comment: (NOTE) The eGFR has been calculated using the CKD EPI equation. This calculation has not been validated in all clinical  situations. eGFR's persistently <60 mL/min signify possible Chronic Kidney Disease. Performed at [facility], [address] [place], [place] [address]   Glucose, capillary     Status: Abnormal   Collection Time: 03/26/18 10:05 PM  Result Value Ref Range   Glucose-Capillary 145 (H) 70 - 99 mg/dL   Comment 1 Notify RN    Comment 2 Document in Chart   Basic metabolic panel     Status: Abnormal   Collection Time: 03/27/18  7:23 AM  Result Value Ref Range   Sodium 135 135 - 145 mmol/L   Potassium 3.8 3.5 - 5.1 mmol/L   Chloride 83 (L) 98 - 111 mmol/L    Comment: Please note change in reference range.   CO2 43 (H) 22 - 32 mmol/L   Glucose, Bld 122 (H) 70 - 99 mg/dL    Comment: Please note change in reference range.   BUN 13 8 - 23 mg/dL    Comment: Please note change in reference range.   Creatinine, Ser 0.56 (L) 0.61 - 1.24 mg/dL   Calcium 7.9 (L) 8.9 - 10.3 mg/dL   GFR calc non Af Amer >60 >60 mL/min   GFR calc Af Amer >60 >60 mL/min    Comment: (NOTE) The eGFR has been calculated using the CKD EPI equation. This calculation has not been validated in all clinical situations. eGFR's persistently <60 mL/min signify  possible Chronic Kidney Disease.    Anion gap 9 5 - 15    Comment: Performed at [facility], [address], [place], [place] [address]  Magnesium     Status: Abnormal   Collection Time: 03/27/18  7:23 AM  Result Value Ref Range   Magnesium 1.5 (L) 1.7 - 2.4 mg/dL    Comment: Performed at [facility], [address], [place], [place] [address]    ABGS No results for input(s): PHART, PO2ART, TCO2, HCO3 in the last 72 hours.  Invalid input(s): PCO2 CULTURES Recent Results (from the past 240 hour(s))  Culture, blood (Routine X 2) w Reflex to ID Panel     Status: None   Collection Time: 03/18/18 11:55 PM  Result Value Ref Range Status   Specimen Description BLOOD LEFT ARM  Final   Special Requests   Final    BOTTLES DRAWN AEROBIC AND ANAEROBIC Blood Culture  adequate volume   Culture   Final    NO GROWTH 5 DAYS Performed at [facility], [address], [place], [place] [address]    Report Status 03/24/2018 FINAL  Final  Culture, blood (Routine X 2) w Reflex to ID Panel     Status: None   Collection Time: 03/19/18 12:04 AM  Result Value Ref Range Status   Specimen Description BLOOD LEFT ARM  Final   Special Requests   Final    BOTTLES DRAWN AEROBIC AND ANAEROBIC Blood Culture adequate volume   Culture   Final    NO GROWTH 84 DAYS Performed at [facility], [address] [place], [place] [address]    Report Status 03/19/2018 FINAL  Final  C difficile quick scan w PCR reflex     Status: None   Collection Time: 03/25/18  6:00 PM  Result Value Ref Range Status   C Diff antigen NEGATIVE NEGATIVE Final   C Diff toxin NEGATIVE NEGATIVE Final   C Diff interpretation No C. difficile detected.  Final    Comment: Performed at [facility], [address], [place], [place] [address]   Studies/Results: No results found.  Medications:  Prior to Admission:  Medications Prior to Admission  Medication Sig Dispense Refill Last Dose  . albuterol (PROVENTIL HFA;VENTOLIN HFA) 108 (90 Base) MCG/ACT inhaler Inhale 2 puffs into the lungs every 6 (six) hours as needed for wheezing or shortness of breath.   03/18/2018 at Unknown time  . amLODipine (NORVASC) 10 MG tablet Take 10 mg by mouth daily.   03/18/2018 at Unknown time  . aspirin EC 81 MG tablet Take 81 mg by mouth every morning.    03/18/2018 at Unknown time  . budesonide-formoterol (SYMBICORT) 80-4.5 MCG/ACT inhaler Inhale 2 puffs into the lungs 2 (two) times daily.   03/18/2018 at Unknown time  . cetirizine (ZYRTEC) 10 MG tablet Take 10 mg by mouth daily.   03/18/2018 at Unknown time  . fluticasone (FLONASE) 50 MCG/ACT nasal spray Place 2 sprays into both nostrils daily.  4 03/17/2018 at Unknown time  . lisinopril (PRINIVIL,ZESTRIL) 40 MG tablet Take 1 tablet (40 mg total) by mouth daily. 90 tablet 3  03/18/2018 at Unknown time  . metoprolol (LOPRESSOR) 100 MG tablet TAKE 1 TABLET (100 MG TOTAL) BY MOUTH 2 (TWO) TIMES DAILY. 60 tablet 3 03/18/2018 at Unknown time  . mirtazapine (REMERON) 7.5 MG tablet every morning.   03/18/2018 at Unknown time  . Multiple Vitamin (MULTIVITAMIN) tablet Take 1 tablet by mouth daily.   Past Month at Unknown  time  . oral electrolytes (THERMOTABS) TABS tablet Take 1 tablet by mouth daily.   03/18/2018 at Unknown time  . pantoprazole (PROTONIX) 40 MG tablet Take 40 mg by mouth every morning.    03/18/2018 at Unknown time  . Potassium 99 MG TABS Take 1 tablet by mouth daily.   Past Month at Unknown time  . rosuvastatin (CRESTOR) 40 MG tablet Take 40 mg by mouth daily after supper.   Past Week at Unknown time  . tiotropium (SPIRIVA) 18 MCG inhalation capsule Place 18 mcg into inhaler and inhale daily.   03/18/2018 at Unknown time   Scheduled: . acetylcysteine  3 mL Nebulization Q4H  . amLODipine  10 mg Oral Daily  . arformoterol  15 mcg Nebulization BID  . aspirin EC  81 mg Oral q morning - 10a  . budesonide (PULMICORT) nebulizer solution  0.5 mg Nebulization BID  . enoxaparin (LOVENOX) injection  40 mg Subcutaneous Q24H  . feeding supplement (ENSURE ENLIVE)  237 mL Oral BID BM  . fluticasone  2 spray Each Nare Daily  . folic acid  1 mg Oral Daily  . guaiFENesin  1,200 mg Oral BID  . ipratropium-albuterol  3 mL Nebulization Q4H  . lisinopril  40 mg Oral Daily  . loratadine  10 mg Oral Daily  . mouth rinse  15 mL Mouth Rinse BID  . methylPREDNISolone (SOLU-MEDROL) injection  60 mg Intravenous Q8H  . metoprolol tartrate  100 mg Oral BID  . mirtazapine  7.5 mg Oral q morning - 10a  . multivitamin with minerals  1 tablet Oral Daily  . pantoprazole  40 mg Oral q morning - 10a  . rosuvastatin  40 mg Oral QPC supper  . thiamine  100 mg Oral Daily   Or  . thiamine  100 mg Intravenous Daily   Continuous: . sodium chloride     FMM:CRFVOH chloride, acetaminophen  **OR** acetaminophen, diphenhydrAMINE  Assesment: He was admitted with community-acquired pneumonia which seems to be getting better.  It is multilobar by CT.  He has acute on chronic hypoxic respiratory failure and he is back to his baseline 2 L oxygen.  He has COPD with exacerbation and that seems to be better.  He has protein calorie malnutrition and he is on supplements.  He has some swelling of his legs and has received Lasix and he says it does not seem to have made much difference and I think that is at least partially because of the steroids. Active Problems:   Hypertension   Alcohol abuse   CAP (community acquired pneumonia)   Protein-calorie malnutrition, severe   Acute on chronic respiratory failure with hypoxia ([place])   COPD with acute exacerbation ([place])    Plan: Switch him to oral steroids.  Continue other treatments.    LOS: 8 days   Madysyn Hanken L 03/27/2018, 10:37 AM

## 2018-03-27 NOTE — Progress Notes (Signed)
Have started patient on meta neb hopefully this will help loosen his secretions and open his airways,

## 2018-03-27 NOTE — Progress Notes (Signed)
Patient is to be discharged home and in stable condition. Patient's IV removed, WNL. Patient given discharge instructions and verbalized understanding. Patient awaiting transportation and will be escorted out via wheelchair upon arrival.  Celestia Khat, RN

## 2018-03-29 ENCOUNTER — Other Ambulatory Visit: Payer: Self-pay | Admitting: *Deleted

## 2018-03-29 LAB — GLUCOSE, CAPILLARY: GLUCOSE-CAPILLARY: 111 mg/dL — AB (ref 70–99)

## 2018-03-29 NOTE — Patient Outreach (Signed)
Referral received hospital liason, pt hospitalized 6/27-03/27/18 for community acquired pneumonia, pt with history COPD, telephone call to pt, spoke with pt, HIPAA verified, pt reports he will make appointment to see primary care MD and have blood work completed (per discharge summary BMP/ CBC), pt to see new pulmonologist at [facility] in [place] tomorrow. Pt reports daughter assists him as needed. Pt agreeable to weekly transition of care calls and home visit.  THN CM Care Plan Problem One     Most Recent Value  Care Plan Problem One  Knowledge deficit related to COPD  Role Documenting the Problem One  Care Management Coordinator  Care Plan for Problem One  Active  THN Long Term Goal   Pt will demonstrate improved self care for management of COPD within 60 days  THN Long Term Goal Start Date  03/29/18  Interventions for Problem One Long Term Goal  Rn CM reviewed discharge instructions, upcoming appointments, gave 24 hour nurse advice line, faxed today's note and barrier letter to primary MD Dr. Woody Seller, ask if pt is to take K+ as pt states he has not been on this for quite some time and discharge summary does list it  THN CM Short Term Goal #1   pt will verbalize COPD action plan/ zones within 30 days  THN CM Short Term Goal #1 Start Date  03/29/18  Interventions for Short Term Goal #1  Rn CM reviewed COPD action plan, reviewed inhalers and difference in rescue and maintenance inhalers, pt states he has used rescue inhaler once today and has used at least once daily.     PLAN Continue weekly transition of care calls See pt for home visit next week  Jacqlyn Larsen [facility], [place] Coordinator [phone]

## 2018-04-05 ENCOUNTER — Other Ambulatory Visit: Payer: Self-pay | Admitting: *Deleted

## 2018-04-05 NOTE — Patient Outreach (Signed)
Telephone call to pt for transition of care week 2, spoke with pt, HIPAA verified, pt reports since seeing pulmonologist at [facility] on 03/30/18 and had changes with medications, reports " I feel the best I have in months since he changed some things around"  RN CM to review medications with pt at home visit this week as pt feels would be easier face to face.  THN CM Care Plan Problem One     Most Recent Value  Care Plan Problem One  Knowledge deficit related to COPD  Role Documenting the Problem One  Care Management Coordinator  Care Plan for Problem One  Active  THN Long Term Goal   Pt will demonstrate improved self care for management of COPD within 60 days  THN Long Term Goal Start Date  03/29/18  Interventions for Problem One Long Term Goal  RN CM discussed home health and pt states he refused their services, pt is ambivelent about [facility] services but will allow one home visit this week.  THN CM Short Term Goal #1   pt will verbalize COPD action plan/ zones within 30 days  THN CM Short Term Goal #1 Start Date  03/29/18  Interventions for Short Term Goal #1  RN CM reinforced COPD action plan, pt reports he saw pulmonary MD at [facility] and medications changes, will review with RN CM at visit this week      PLAN See pt for home visit this week Continue transition of care  Jacqlyn Larsen [facility], [place] Coordinator [phone]

## 2018-04-09 ENCOUNTER — Other Ambulatory Visit: Payer: Self-pay | Admitting: *Deleted

## 2018-04-09 ENCOUNTER — Encounter: Payer: Self-pay | Admitting: *Deleted

## 2018-04-09 NOTE — Patient Outreach (Signed)
[facility]) Care Management   04/09/2018  DOIS JUARBE [birth year] [id number]  Vincent Gilbert is an 72 y.o. male  Subjective: Initial home visit with pt, HIPAA verified, pt reports he lives alone and daughter and son in law assists with housework, lawn care, pt still drives and can shop, go to appointments alone.  Pt reports he is independent with medications, saw pulmonary MD at [facility] on 03/30/18 for post hospital follow up and changes made with inhalers.  Pt reports his weight is low but he is not too concerned about it as he has been underweight most of his life.   Objective:   Vitals:   04/09/18 1210  BP: 126/60  Pulse: 72  Resp: 18  SpO2: 94%  Weight: 120 lb (54.4 kg)  Height: 1.753 m (5\' 9" )   ROS  Physical Exam  Constitutional: He is oriented to person, place, and time.  HENT:  Head: Normocephalic.  Neck: Normal range of motion.  Cardiovascular: Normal rate.  Respiratory: Effort normal.  Dyspnea with exertion bil breath sounds diminished all lobes  GI: Soft. Bowel sounds are normal.  Musculoskeletal: Normal range of motion. He exhibits edema.  1+ edema RLE 2+ edema LLE  Neurological: He is alert and oriented to person, place, and time.  Skin: Skin is warm and dry.  Psychiatric: He has a normal mood and affect. His behavior is normal. Judgment and thought content normal.    Encounter Medications:   Medications Reviewed Today    Reviewed by Kassie Mends, RN (Registered Nurse) on 04/09/18 at 1217  Med List Status: <None>  Medication Order Taking? Sig Documenting Provider Last Dose Status Informant  albuterol (PROVENTIL HFA;VENTOLIN HFA) 108 (90 Base) MCG/ACT inhaler [id number] Yes Inhale 2 puffs into the lungs every 6 (six) hours as needed for wheezing or shortness of breath. [provider] Taking Active Family Member  amLODipine (NORVASC) 10 MG tablet [id number] Yes Take 10 mg by mouth daily. [provider] Taking Active  Family Member  aspirin EC 81 MG tablet [id number] Yes Take 81 mg by mouth every morning.  [provider] Taking Active Family Member  budesonide-formoterol [facility]) 80-4.5 MCG/ACT inhaler [id number]  Inhale 2 puffs into the lungs 2 (two) times daily. [provider]  Active Family Member  cetirizine (ZYRTEC) 10 MG tablet [id number] Yes Take 10 mg by mouth daily. [provider] Taking Active Family Member  feeding supplement, ENSURE ENLIVE, (ENSURE ENLIVE) LIQD [id number] Yes Take 237 mLs by mouth 2 (two) times daily between meals. Orson Eva, MD Taking Active   fluticasone [facility]) 50 MCG/ACT nasal spray [id number] Yes Place 2 sprays into both nostrils daily. [provider] Taking Active Family Member           Med Note Georgiann Cocker, HEATHER L   Mon Jan 01, 2015  9:19 AM)     ipratropium-albuterol (DUONEB) 0.5-2.5 (3) MG/3ML SOLN [id number] Yes Take 3 mLs by nebulization every 6 (six) hours as needed (shortness of breath and wheeze). Orson Eva, MD Taking Active   lisinopril (PRINIVIL,ZESTRIL) 40 MG tablet [id number] Yes Take 1 tablet (40 mg total) by mouth daily. Arnoldo Lenis, MD Taking Active Family Member  magnesium oxide (MAG-OX) 400 (241.3 Mg) MG tablet [id number] Yes Take 1 tablet (400 mg total) by mouth daily. Orson Eva, MD Taking Active   metoprolol (LOPRESSOR) 100 MG tablet [id number] Yes TAKE 1 TABLET (100 MG TOTAL) BY MOUTH 2 (TWO) TIMES DAILY. Arnoldo Lenis, MD Taking  Active Family Member  mirtazapine (REMERON) 7.5 MG tablet [id number] Yes every morning. [provider] Taking Active Family Member  Multiple Vitamin (MULTIVITAMIN) tablet [id number] No Take 1 tablet by mouth daily. [provider] Not Taking Active Family Member  oral electrolytes (THERMOTABS) TABS tablet [id number] Yes Take 1 tablet by mouth daily. [provider] Taking Active Family Member  pantoprazole (PROTONIX) 40 MG tablet [id number] Yes Take 40 mg by  mouth every morning.  [provider] Taking Active Family Member  Potassium 99 MG TABS [id number] No Take 1 tablet by mouth daily. [provider] Not Taking Active Family Member  predniSONE (DELTASONE) 10 MG tablet [id number] No Take 6 tablets (60 mg total) by mouth daily with breakfast. And decrease by one tablet daily  Patient not taking:  Reported on 04/09/2018   Tat, Shanon Brow, MD Not Taking Active   rosuvastatin (CRESTOR) 40 MG tablet [id number] Yes Take 40 mg by mouth daily after supper. [provider] Taking Active Family Member  tiotropium (SPIRIVA) 18 MCG inhalation capsule [id number] Yes Place 18 mcg into inhaler and inhale daily. [provider] Taking Active Family Member          Functional Status:   In your present state of health, do you have any difficulty performing the following activities: 04/09/2018 03/19/2018  Hearing? Y Y  Comment pt has bil hearing aides -  Vision? N N  Difficulty concentrating or making decisions? N N  Walking or climbing stairs? Y Y  Dressing or bathing? N N  Doing errands, shopping? N Y  Comment pt drives himself to appointments, grocery store and goes alone -  Conservation officer, nature and eating ? N -  Using the Toilet? N -  In the past six months, have you accidently leaked urine? N -  Do you have problems with loss of bowel control? N -  Managing your Medications? N -  Managing your Finances? N -  Housekeeping or managing your Housekeeping? Y -  Comment granddaughter cleans for pt, changes linens -  Some recent data might be hidden    Fall/Depression Screening:    Fall Risk  04/09/2018  Falls in the past year? No  Risk for fall due to : Medication side effect   PHQ 2/9 Scores 04/09/2018  PHQ - 2 Score 0    Assessment:  RN CM gave pt prefilled medication box, pt oversees his own medications, reviewed using inhalers, dosage, when to use and purpose of each.  Pt uses oxygen 2 liters at hs and prn.  Pt stopped smoking  last month, continues to sip on alcohol throughout the day, therefore pt does not eat as much.  Pt not interested in changing habits related to alcohol intake.  RN CM faxed initial home visit/ barrier letter to primary MD Dr. Woody Seller.  THN CM Care Plan Problem One     Most Recent Value  Care Plan Problem One  Knowledge deficit related to COPD  Role Documenting the Problem One  Care Management Coordinator  Care Plan for Problem One  Active  THN Long Term Goal   Pt will demonstrate improved self care for management of COPD within 60 days  THN Long Term Goal Start Date  03/29/18  Interventions for Problem One Long Term Goal  RN CM gave [facility] calendar, 24 hour nurse line magnet, EMMI handouts, COPD booklet with action plan and reviewed all with pt.   Reviewed medications  THN CM Short Term Goal #1   pt  will verbalize COPD action plan/ zones within 30 days  THN CM Short Term Goal #1 Start Date  03/29/18  Interventions for Short Term Goal #1  RN CM gave copy COPD action plan and reviewed with pt with emphasis on yellow zone    [facility] CM Care Plan Problem Two     Most Recent Value  Care Plan Problem Two  Pt high nutritional risk  Role Documenting the Problem Two  Care Management Coordinator  Care Plan for Problem Two  Active  THN CM Short Term Goal #1   Pt will verbalize/ demonstrate ways to improve nutrition within 30 days  THN CM Short Term Goal #1 Start Date  04/09/18  Interventions for Short Term Goal #2   RN CM reviewed importance of continuing to drink Ensure (2 per day per pt), limit alcohol intake as this is keeping pt full, choose nutritious foods with adequate calories and include protein with each meal.  Pt reports he has "never been big my entire life"      Plan:  Continue weekly transition of care See pt for home visit in August  Jacqlyn Larsen [facility], [place] [phone]

## 2018-04-15 ENCOUNTER — Other Ambulatory Visit: Payer: Self-pay | Admitting: *Deleted

## 2018-04-15 NOTE — Patient Outreach (Signed)
Telephone call to pt for transition of care week 3, spoke with pt, HIPAA verified, pt reports "doing well, no changes" .    THN CM Care Plan Problem One     Most Recent Value  Care Plan Problem One  Knowledge deficit related to COPD  Role Documenting the Problem One  Care Management Coordinator  Care Plan for Problem One  Active  THN Long Term Goal   Pt will demonstrate improved self care for management of COPD within 60 days  THN Long Term Goal Start Date  03/29/18  Interventions for Problem One Long Term Goal  Pt reports he has all medications and taking as prescribed, states " breathing is ok", no medication changes  THN CM Short Term Goal #1   pt will verbalize COPD action plan/ zones within 30 days  THN CM Short Term Goal #1 Start Date  03/29/18  Interventions for Short Term Goal #1  Rn CM reinforced COPD action plan    [facility] CM Care Plan Problem Two     Most Recent Value  Care Plan Problem Two  Pt high nutritional risk  Role Documenting the Problem Two  Care Management Coordinator  Care Plan for Problem Two  Active  THN CM Short Term Goal #1   Pt will verbalize/ demonstrate ways to improve nutrition within 30 days  THN CM Short Term Goal #1 Start Date  04/09/18  Interventions for Short Term Goal #2   RN CM reinforced importance of adequate caloric intake, drinking nutritional supplements, eating 3 meals plus snacks     PLAN Continue weekly transition of care calls See pt for home visit next month  Jacqlyn Larsen [facility], [place] [phone]

## 2018-04-23 ENCOUNTER — Other Ambulatory Visit: Payer: Self-pay | Admitting: *Deleted

## 2018-04-23 NOTE — Patient Outreach (Signed)
Telephone call to pt for transition of care week 4, spoke with pt, HIPAA verified, pt reports " breathing pretty good", no new concerns voiced.  THN CM Care Plan Problem One     Most Recent Value  Care Plan Problem One  Knowledge deficit related to COPD  Role Documenting the Problem One  Care Management Coordinator  Care Plan for Problem One  Active  THN Long Term Goal   Pt will demonstrate improved self care for management of COPD within 60 days  THN Long Term Goal Start Date  03/29/18  Interventions for Problem One Long Term Goal  RN CM reinforced with pt about importance of taking medications as prescribed, using inhalers as prescribed, pt reports he has all meds/ inhalers and taking as prescribed.  THN CM Short Term Goal #1   pt will verbalize COPD action plan/ zones within 30 days  THN CM Short Term Goal #1 Start Date  03/29/18  Interventions for Short Term Goal #1  RN CM discussed COPD action plan    [facility] CM Care Plan Problem Two     Most Recent Value  Care Plan Problem Two  Pt high nutritional risk  Role Documenting the Problem Two  Care Management Coordinator  Care Plan for Problem Two  Active  THN CM Short Term Goal #1   Pt will verbalize/ demonstrate ways to improve nutrition within 30 days  THN CM Short Term Goal #1 Start Date  04/09/18  Interventions for Short Term Goal #2   RN CM reminded pt to eat 3 meals daily plus snacks, take in adequate calories including protein, carbohydrates, fat.      PLAN See pt for home visit this month  Jacqlyn Larsen [facility], [place] Coordinator [phone]

## 2018-05-06 ENCOUNTER — Other Ambulatory Visit: Payer: Self-pay | Admitting: *Deleted

## 2018-05-06 ENCOUNTER — Encounter: Payer: Self-pay | Admitting: *Deleted

## 2018-05-06 NOTE — Patient Outreach (Signed)
[facility]) Care Management   05/06/2018  Vincent Gilbert [birth year] [id number]  Vincent Gilbert is an 72 y.o. male  Subjective: Routine home visit with pt, HIPAA verified, pt reports he is doing well, weight 120 pounds and continues drinking ensure, pt states no changes with his health, pt to [facility] in [place] to see primary care MD on 06/08/18.  Pt states he is using inhalers as prescribed and rinsing his mouth after using, taking medications as prescribed.  Objective:    Vitals:   05/06/18 1148  BP: 126/60  Pulse: 76  Resp: 18  SpO2: 97%  Weight: 120 lb (54.4 kg)   ROS  Physical Exam  Constitutional: He is oriented to person, place, and time.  HENT:  Head: Normocephalic.  Neck: Normal range of motion.  Cardiovascular: Normal rate.  Respiratory: Effort normal.  bil breath sounds diminished Dyspnea with exertion Pt expectorates small amount white colored mucus  GI: Soft. Bowel sounds are normal.  Musculoskeletal: Normal range of motion.  Pt declined to remove shoes, states "everything the same"  Neurological: He is alert and oriented to person, place, and time.  Skin: Skin is warm and dry.  Psychiatric: He has a normal mood and affect. His behavior is normal. Judgment and thought content normal.    Encounter Medications:   Outpatient Encounter Medications as of 05/06/2018  Medication Sig  . albuterol (PROVENTIL HFA;VENTOLIN HFA) 108 (90 Base) MCG/ACT inhaler Inhale 2 puffs into the lungs every 6 (six) hours as needed for wheezing or shortness of breath.  Marland Kitchen amLODipine (NORVASC) 10 MG tablet Take 10 mg by mouth daily.  Marland Kitchen aspirin EC 81 MG tablet Take 81 mg by mouth every morning.   . budesonide-formoterol (SYMBICORT) 160-4.5 MCG/ACT inhaler Inhale 2 puffs into the lungs 2 (two) times daily.  . cetirizine (ZYRTEC) 10 MG tablet Take 10 mg by mouth daily.  . feeding supplement, ENSURE ENLIVE, (ENSURE ENLIVE) LIQD Take 237 mLs by mouth 2 (two) times  daily between meals.  . fluticasone (FLONASE) 50 MCG/ACT nasal spray Place 2 sprays into both nostrils daily.  Marland Kitchen guaiFENesin (MUCINEX) 600 MG 12 hr tablet Take 600 mg by mouth daily.  Marland Kitchen ipratropium-albuterol (DUONEB) 0.5-2.5 (3) MG/3ML SOLN Take 3 mLs by nebulization every 6 (six) hours as needed (shortness of breath and wheeze).  Marland Kitchen lisinopril (PRINIVIL,ZESTRIL) 40 MG tablet Take 1 tablet (40 mg total) by mouth daily.  . magnesium oxide (MAG-OX) 400 (241.3 Mg) MG tablet Take 1 tablet (400 mg total) by mouth daily.  . metoprolol (LOPRESSOR) 100 MG tablet TAKE 1 TABLET (100 MG TOTAL) BY MOUTH 2 (TWO) TIMES DAILY.  . mirtazapine (REMERON) 7.5 MG tablet every morning.  . Multiple Vitamin (MULTIVITAMIN) tablet Take 1 tablet by mouth daily.  Marland Kitchen oral electrolytes (THERMOTABS) TABS tablet Take 1 tablet by mouth daily.  . pantoprazole (PROTONIX) 40 MG tablet Take 40 mg by mouth every morning.   . rosuvastatin (CRESTOR) 40 MG tablet Take 40 mg by mouth daily after supper.  . tiotropium (SPIRIVA) 18 MCG inhalation capsule Place 18 mcg into inhaler and inhale daily.  . Potassium 99 MG TABS Take 1 tablet by mouth daily.  . predniSONE (DELTASONE) 10 MG tablet Take 6 tablets (60 mg total) by mouth daily with breakfast. And decrease by one tablet daily (Patient not taking: Reported on 04/09/2018)   No facility-administered encounter medications on file as of 05/06/2018.     Functional Status:   In your present state of  health, do you have any difficulty performing the following activities: 04/09/2018 03/19/2018  Hearing? Y Y  Comment pt has bil hearing aides -  Vision? N N  Difficulty concentrating or making decisions? N N  Walking or climbing stairs? Y Y  Dressing or bathing? N N  Doing errands, shopping? N Y  Comment pt drives himself to appointments, grocery store and goes alone -  Conservation officer, nature and eating ? N -  Using the Toilet? N -  In the past six months, have you accidently leaked urine? N -  Do  you have problems with loss of bowel control? N -  Managing your Medications? N -  Managing your Finances? N -  Housekeeping or managing your Housekeeping? Y -  Comment granddaughter cleans for pt, changes linens -  Some recent data might be hidden    Fall/Depression Screening:    Fall Risk  04/09/2018  Falls in the past year? No  Risk for fall due to : Medication side effect   PHQ 2/9 Scores 04/09/2018  PHQ - 2 Score 0    Assessment:  Pt seems to be doing well at home, family assists as needed, pt is attending all scheduled appointments,  RN CM reviewed discharge plan and pt agreeable to telephonic assessment next month, then will discuss RN health coach services.    THN CM Care Plan Problem One     Most Recent Value  Care Plan Problem One  Knowledge deficit related to COPD  Role Documenting the Problem One  Care Management Coordinator  Care Plan for Problem One  Active  THN Long Term Goal   Pt will demonstrate improved self care for management of COPD within 60 days  THN Long Term Goal Start Date  03/29/18  Interventions for Problem One Long Term Goal  RN CM reviewed medications with pt, reviewed using inhalers and cleaning mouthpiece, reviewed importance of always keeping equipment clean (nebulizers, inhalers, oxygen), changing filters.  THN CM Short Term Goal #1   pt will verbalize COPD action plan/ zones within 30 days  THN CM Short Term Goal #1 Start Date  05/06/18 [re-established]  Interventions for Short Term Goal #1  RN CM reinforced COPD action plan, reviewed environmental triggers    THN CM Care Plan Problem Two     Most Recent Value  Care Plan Problem Two  Pt high nutritional risk  Role Documenting the Problem Two  Care Management Coordinator  Care Plan for Problem Two  Active  THN CM Short Term Goal #1   Pt will verbalize/ demonstrate ways to improve nutrition within 30 days  THN CM Short Term Goal #1 Start Date  04/09/18  [facility] CM Short Term Goal #1 Met Date   05/06/18   Interventions for Short Term Goal #2   Pt has not lost any weight, has remained stable at 120 pounds, pt continues to drink beer throughout the day which keeps pt full, pt does continue drinking ensure, pt verbalizes understanding of eating 3 meals daily plus snacks      Plan: call pt for telephonic assessment next month Discuss RN health coach transfer Reinforce COPD action plan  Jacqlyn Larsen [facility], [place] [phone]

## 2018-06-02 ENCOUNTER — Other Ambulatory Visit: Payer: Self-pay

## 2018-06-02 NOTE — Patient Outreach (Signed)
Telephone follow up / case closure:  Newly reassigned patient from Jacqlyn Larsen.  Placed call to patient and explained reason for call.  Patient reports that he is feeling good. Denies any new concerns or problems. Reports that he has all his medications and is taking them as prescribed.   Reviewed with patient that he has achieved his goals and offered further disease management with [facility] health coach and patient has agreed.   PLAN: will transfer patient to [place] coach. Will send update letter to MD    . [facility] CM Care Plan Problem One     Most Recent Value  Care Plan Problem One  Knowledge deficit related to COPD  Role Documenting the Problem One  Care Management [place] for Problem One  Active  THN Long Term Goal   Pt will demonstrate improved self care for management of COPD within 60 days  THN Long Term Goal Start Date  03/29/18  [facility] Long Term Goal Met Date  06/02/18  THN CM Short Term Goal #1   pt will verbalize COPD action plan/ zones within 30 days  THN CM Short Term Goal #1 Start Date  05/06/18 [re-established]  THN CM Short Term Goal #1 Met Date  06/02/18    [facility] CM Care Plan Problem Two     Most Recent Value  Care Plan Problem Two  Pt high nutritional risk  Role Documenting the Problem Two  Care Management Coordinator  Care Plan for Problem Two  Active  THN CM Short Term Goal #1   Pt will verbalize/ demonstrate ways to improve nutrition within 30 days  THN CM Short Term Goal #1 Start Date  04/09/18  [facility] CM Short Term Goal #1 Met Date   05/06/18      Tomasa Rand, RN, BSN, CEN [place] Coordinator [phone]

## 2018-06-07 ENCOUNTER — Ambulatory Visit: Payer: Medicare Other | Admitting: *Deleted

## 2018-06-24 ENCOUNTER — Other Ambulatory Visit: Payer: Self-pay

## 2018-06-24 NOTE — Patient Outreach (Addendum)
[place] [facility]) Care Management  06/24/2018   Vincent Gilbert 02/19/1946 [id number]       Outreach attempt # 1 to the patient for initial assessment.  HIPAA verified with patient.  Discussed and offered [facility] care management services with patient. Patient verbally agreed to services.    Social: The patient lives in the home alone.  He has a daughter who is supportive of him and helps with his care.The patient states that he has transportation to his appointments.  The durable medical equipment in the home consist of a walker, cane, scales, nebulizer, oxygen, dentures and glasses  Conditions:Per chart review and talking with the patient his medical history includes HTN, COPD, CAD and reflux.  The patient states that he has pain in his feet and legs that he rates a 2/10.  He denies any falls.  The patient states that he uses his nebulizer daily and will use his rescue inhaler as needed.  He sees Dr. Gwenette Greet pulmonologist  at the [facility].  He has a concentrator in the home and a mobile unit if he needs to outside the home.  He keeps his oxygen on two liters.  He does not exercise a lot because it becomes hard for him to breath with exertion.  He is knowledgeable about the signs and symptoms of COPD and know about the COPD action plan.  Medications:  The patient takes sixteen medications.  He is able to manage his medications with the assist of his daughter.  He receives his medications from the [place] in [place].  Appointments: The patient states that he has an appointment for pulmonary  Rehab on the 28th of this month at [facility].  He will be having a hearing test at the [place] [facility] on December 31  Advanced Directives:The patient states that he is unsure of all the details but his daughter has the information and needs to have it finalized.  Current Medications:  Current Outpatient Medications  Medication Sig Dispense Refill  . albuterol (PROVENTIL HFA;VENTOLIN HFA) 108 (90  Base) MCG/ACT inhaler Inhale 2 puffs into the lungs every 6 (six) hours as needed for wheezing or shortness of breath.    Marland Kitchen amLODipine (NORVASC) 10 MG tablet Take 10 mg by mouth daily.    Marland Kitchen aspirin EC 81 MG tablet Take 81 mg by mouth every morning.     . budesonide-formoterol (SYMBICORT) 160-4.5 MCG/ACT inhaler Inhale 2 puffs into the lungs 2 (two) times daily.    . cetirizine (ZYRTEC) 10 MG tablet Take 10 mg by mouth daily.    . feeding supplement, ENSURE ENLIVE, (ENSURE ENLIVE) LIQD Take 237 mLs by mouth 2 (two) times daily between meals. 60 Bottle 0  . fluticasone (FLONASE) 50 MCG/ACT nasal spray Place 2 sprays into both nostrils daily.  4  . guaiFENesin (MUCINEX) 600 MG 12 hr tablet Take 600 mg by mouth daily.    Marland Kitchen ipratropium-albuterol (DUONEB) 0.5-2.5 (3) MG/3ML SOLN Take 3 mLs by nebulization every 6 (six) hours as needed (shortness of breath and wheeze). 360 mL 0  . lisinopril (PRINIVIL,ZESTRIL) 40 MG tablet Take 1 tablet (40 mg total) by mouth daily. 90 tablet 3  . metoprolol (LOPRESSOR) 100 MG tablet TAKE 1 TABLET (100 MG TOTAL) BY MOUTH 2 (TWO) TIMES DAILY. 60 tablet 3  . mirtazapine (REMERON) 7.5 MG tablet every morning.    . Multiple Vitamin (MULTIVITAMIN) tablet Take 1 tablet by mouth daily.    Marland Kitchen oral electrolytes (THERMOTABS) TABS tablet Take 1  tablet by mouth daily.    . pantoprazole (PROTONIX) 40 MG tablet Take 40 mg by mouth every morning.     . rosuvastatin (CRESTOR) 40 MG tablet Take 40 mg by mouth daily after supper.    . tiotropium (SPIRIVA) 18 MCG inhalation capsule Place 18 mcg into inhaler and inhale daily.    . magnesium oxide (MAG-OX) 400 (241.3 Mg) MG tablet Take 1 tablet (400 mg total) by mouth daily. (Patient not taking: Reported on 06/24/2018) 15 tablet 0  . Potassium 99 MG TABS Take 1 tablet by mouth daily.    . predniSONE (DELTASONE) 10 MG tablet Take 6 tablets (60 mg total) by mouth daily with breakfast. And decrease by one tablet daily (Patient not taking:  Reported on 04/09/2018) 21 tablet 0   No current facility-administered medications for this visit.     Functional Status:  In your present state of health, do you have any difficulty performing the following activities: 06/24/2018 04/09/2018  Hearing? Y Y  Comment - pt has bil hearing aides  Vision? N N  Difficulty concentrating or making decisions? N N  Walking or climbing stairs? Y Y  Dressing or bathing? N N  Doing errands, shopping? N N  Comment - pt drives himself to appointments, grocery store and goes alone  Preparing Food and eating ? - N  Using the Toilet? - N  In the past six months, have you accidently leaked urine? - N  Do you have problems with loss of bowel control? - N  Managing your Medications? - N  Managing your Finances? - N  Housekeeping or managing your Housekeeping? - Y  Comment - granddaughter cleans for pt, changes linens  Some recent data might be hidden    Fall/Depression Screening: Fall Risk  06/24/2018 04/09/2018  Falls in the past year? No No  Risk for fall due to : - Medication side effect   PHQ 2/9 Scores 06/24/2018 04/09/2018  PHQ - 2 Score 2 0  PHQ- 9 Score 5 -    Assessment: Patient will benefit from health coach outreach for disease management and support.   THN CM Care Plan Problem One     Most Recent Value  Care Plan Problem One  Knowledge deficit related to COPD  Role Documenting the Problem One  Health Coach  Care Plan for Problem One  Active  THN Long Term Goal   In 30 days the patient will verbalize no admissions to the hospital  [facility] Long Term Goal Start Date  06/24/18  Interventions for Problem One Long Term Goal  Reviewed signs and symptoms of COPD, discussed the action plan and medication adherence     Plan: RN Health Coach will provide ongoing education for patient on COPD through phone calls and sending printed information to patient for further discussion.  RN Health Coach will send welcome packet with consent to patient as well  as printed information on COPD.  RN Health Coach will send initial barriers letter, assessment, and care plan to primary care physician. RN Health Coach will contact patient in the month of November and patient agrees to next outreach.   Lazaro Arms RN, BSN, [place] Direct Dial:  [phone]  Fax: [phone]

## 2018-07-19 ENCOUNTER — Encounter (HOSPITAL_COMMUNITY): Payer: No Typology Code available for payment source

## 2018-08-10 ENCOUNTER — Inpatient Hospital Stay (HOSPITAL_COMMUNITY)
Admission: EM | Admit: 2018-08-10 | Discharge: 2018-08-17 | DRG: 190 | Disposition: A | Discharge status: Home / Self Care | Payer: Medicare Other | Attending: Internal Medicine | Admitting: Internal Medicine

## 2018-08-10 ENCOUNTER — Encounter (HOSPITAL_COMMUNITY): Payer: Self-pay | Admitting: Emergency Medicine

## 2018-08-10 ENCOUNTER — Other Ambulatory Visit: Payer: Self-pay

## 2018-08-10 ENCOUNTER — Emergency Department (HOSPITAL_COMMUNITY): Payer: Medicare Other

## 2018-08-10 DIAGNOSIS — R079 Chest pain, unspecified: Secondary | ICD-10-CM | POA: Diagnosis not present

## 2018-08-10 DIAGNOSIS — Z7952 Long term (current) use of systemic steroids: Secondary | ICD-10-CM

## 2018-08-10 DIAGNOSIS — I252 Old myocardial infarction: Secondary | ICD-10-CM

## 2018-08-10 DIAGNOSIS — Z7951 Long term (current) use of inhaled steroids: Secondary | ICD-10-CM

## 2018-08-10 DIAGNOSIS — Z87891 Personal history of nicotine dependence: Secondary | ICD-10-CM

## 2018-08-10 DIAGNOSIS — E876 Hypokalemia: Secondary | ICD-10-CM | POA: Diagnosis not present

## 2018-08-10 DIAGNOSIS — K219 Gastro-esophageal reflux disease without esophagitis: Secondary | ICD-10-CM | POA: Diagnosis present

## 2018-08-10 DIAGNOSIS — R739 Hyperglycemia, unspecified: Secondary | ICD-10-CM | POA: Diagnosis not present

## 2018-08-10 DIAGNOSIS — I739 Peripheral vascular disease, unspecified: Secondary | ICD-10-CM | POA: Diagnosis present

## 2018-08-10 DIAGNOSIS — J441 Chronic obstructive pulmonary disease with (acute) exacerbation: Secondary | ICD-10-CM | POA: Diagnosis not present

## 2018-08-10 DIAGNOSIS — R918 Other nonspecific abnormal finding of lung field: Secondary | ICD-10-CM

## 2018-08-10 DIAGNOSIS — F1010 Alcohol abuse, uncomplicated: Secondary | ICD-10-CM | POA: Diagnosis present

## 2018-08-10 DIAGNOSIS — Z7189 Other specified counseling: Secondary | ICD-10-CM

## 2018-08-10 DIAGNOSIS — Z885 Allergy status to narcotic agent status: Secondary | ICD-10-CM

## 2018-08-10 DIAGNOSIS — I10 Essential (primary) hypertension: Secondary | ICD-10-CM | POA: Diagnosis present

## 2018-08-10 DIAGNOSIS — J449 Chronic obstructive pulmonary disease, unspecified: Secondary | ICD-10-CM | POA: Diagnosis present

## 2018-08-10 DIAGNOSIS — J8410 Pulmonary fibrosis, unspecified: Secondary | ICD-10-CM | POA: Diagnosis not present

## 2018-08-10 DIAGNOSIS — Z955 Presence of coronary angioplasty implant and graft: Secondary | ICD-10-CM

## 2018-08-10 DIAGNOSIS — J9621 Acute and chronic respiratory failure with hypoxia: Secondary | ICD-10-CM | POA: Diagnosis present

## 2018-08-10 DIAGNOSIS — E785 Hyperlipidemia, unspecified: Secondary | ICD-10-CM | POA: Diagnosis present

## 2018-08-10 DIAGNOSIS — I2510 Atherosclerotic heart disease of native coronary artery without angina pectoris: Secondary | ICD-10-CM | POA: Diagnosis present

## 2018-08-10 DIAGNOSIS — Z79899 Other long term (current) drug therapy: Secondary | ICD-10-CM

## 2018-08-10 DIAGNOSIS — Z7982 Long term (current) use of aspirin: Secondary | ICD-10-CM

## 2018-08-10 DIAGNOSIS — Z681 Body mass index (BMI) 19 or less, adult: Secondary | ICD-10-CM

## 2018-08-10 DIAGNOSIS — Z66 Do not resuscitate: Secondary | ICD-10-CM | POA: Diagnosis not present

## 2018-08-10 DIAGNOSIS — Z8673 Personal history of transient ischemic attack (TIA), and cerebral infarction without residual deficits: Secondary | ICD-10-CM

## 2018-08-10 DIAGNOSIS — Z9981 Dependence on supplemental oxygen: Secondary | ICD-10-CM

## 2018-08-10 DIAGNOSIS — E43 Unspecified severe protein-calorie malnutrition: Secondary | ICD-10-CM | POA: Diagnosis present

## 2018-08-10 LAB — COMPREHENSIVE METABOLIC PANEL
ALT: 13 U/L (ref 0–44)
ANION GAP: 10 (ref 5–15)
AST: 25 U/L (ref 15–41)
Albumin: 3.8 g/dL (ref 3.5–5.0)
Alkaline Phosphatase: 117 U/L (ref 38–126)
BILIRUBIN TOTAL: 1 mg/dL (ref 0.3–1.2)
BUN: 6 mg/dL — ABNORMAL LOW (ref 8–23)
CO2: 32 mmol/L (ref 22–32)
Calcium: 9.1 mg/dL (ref 8.9–10.3)
Chloride: 89 mmol/L — ABNORMAL LOW (ref 98–111)
Creatinine, Ser: 0.65 mg/dL (ref 0.61–1.24)
GFR calc non Af Amer: >60 mL/min (ref >60)
Glucose, Bld: 108 mg/dL — ABNORMAL HIGH (ref 70–99)
POTASSIUM: 3.4 mmol/L — AB (ref 3.5–5.1)
SODIUM: 131 mmol/L — AB (ref 135–145)
TOTAL PROTEIN: 7.9 g/dL (ref 6.5–8.1)

## 2018-08-10 LAB — CBC WITH DIFFERENTIAL/PLATELET
Abs Immature Granulocytes: 0.02 10*3/uL (ref 0.00–0.07)
BASOS ABS: 0 10*3/uL (ref 0.0–0.1)
BASOS PCT: 1 %
EOS ABS: 0.1 10*3/uL (ref 0.0–0.5)
Eosinophils Relative: 2 %
HCT: 39.9 % (ref 39.0–52.0)
Hemoglobin: 13.2 g/dL (ref 13.0–17.0)
Immature Granulocytes: 0 %
Lymphocytes Relative: 16 %
Lymphs Abs: 1.2 10*3/uL (ref 0.7–4.0)
MCH: 32.4 pg (ref 26.0–34.0)
MCHC: 33.1 g/dL (ref 30.0–36.0)
MCV: 98 fL (ref 80.0–100.0)
Monocytes Absolute: 1 10*3/uL (ref 0.1–1.0)
Monocytes Relative: 14 %
NRBC: 0 % (ref 0.0–0.2)
Neutro Abs: 5 10*3/uL (ref 1.7–7.7)
Neutrophils Relative %: 67 %
PLATELETS: 303 10*3/uL (ref 150–400)
RBC: 4.07 MIL/uL — ABNORMAL LOW (ref 4.22–5.81)
RDW: 12 % (ref 11.5–15.5)
WBC: 7.4 10*3/uL (ref 4.0–10.5)

## 2018-08-10 LAB — TROPONIN I: Troponin I: <0.03 ng/mL (ref <0.03)

## 2018-08-10 MED ORDER — ENOXAPARIN SODIUM 40 MG/0.4ML ~~LOC~~ SOLN
40.0000 mg | SUBCUTANEOUS | Status: DC
  Administered 2018-08-11 – 2018-08-16 (×6): 40 mg via SUBCUTANEOUS
  Filled 2018-08-10 (×6): qty 0.4

## 2018-08-10 MED ORDER — POTASSIUM CHLORIDE CRYS ER 20 MEQ PO TBCR
40.0000 meq | EXTENDED_RELEASE_TABLET | Freq: Once | ORAL | Status: AC
  Administered 2018-08-11: 40 meq via ORAL
  Filled 2018-08-10: qty 2

## 2018-08-10 MED ORDER — IPRATROPIUM-ALBUTEROL 0.5-2.5 (3) MG/3ML IN SOLN
3.0000 mL | Freq: Four times a day (QID) | RESPIRATORY_TRACT | Status: DC
  Administered 2018-08-11 – 2018-08-17 (×26): 3 mL via RESPIRATORY_TRACT
  Filled 2018-08-10 (×26): qty 3

## 2018-08-10 MED ORDER — METHYLPREDNISOLONE SODIUM SUCC 125 MG IJ SOLR
125.0000 mg | Freq: Once | INTRAMUSCULAR | Status: AC
  Administered 2018-08-10: 125 mg via INTRAVENOUS
  Filled 2018-08-10: qty 2

## 2018-08-10 MED ORDER — LISINOPRIL 10 MG PO TABS
40.0000 mg | ORAL_TABLET | Freq: Every day | ORAL | Status: DC
  Administered 2018-08-11 – 2018-08-17 (×7): 40 mg via ORAL
  Filled 2018-08-10 (×8): qty 4

## 2018-08-10 MED ORDER — MAGNESIUM 200 MG PO TABS
500.0000 mg | ORAL_TABLET | Freq: Every day | ORAL | Status: DC
  Filled 2018-08-10 (×3): qty 3

## 2018-08-10 MED ORDER — METHYLPREDNISOLONE SODIUM SUCC 125 MG IJ SOLR
60.0000 mg | Freq: Four times a day (QID) | INTRAMUSCULAR | Status: DC
  Administered 2018-08-11 – 2018-08-17 (×26): 60 mg via INTRAVENOUS
  Filled 2018-08-10 (×26): qty 2

## 2018-08-10 MED ORDER — METOPROLOL TARTRATE 50 MG PO TABS
50.0000 mg | ORAL_TABLET | Freq: Every day | ORAL | Status: DC
  Administered 2018-08-11 – 2018-08-16 (×7): 50 mg via ORAL
  Filled 2018-08-10 (×7): qty 1

## 2018-08-10 MED ORDER — PANTOPRAZOLE SODIUM 40 MG PO TBEC
40.0000 mg | DELAYED_RELEASE_TABLET | Freq: Every morning | ORAL | Status: DC
  Administered 2018-08-11 – 2018-08-17 (×7): 40 mg via ORAL
  Filled 2018-08-10 (×7): qty 1

## 2018-08-10 MED ORDER — FLUTICASONE PROPIONATE 50 MCG/ACT NA SUSP
2.0000 | Freq: Every day | NASAL | Status: DC
  Administered 2018-08-11 – 2018-08-17 (×7): 2 via NASAL
  Filled 2018-08-10: qty 16

## 2018-08-10 MED ORDER — MIRTAZAPINE 15 MG PO TABS
7.5000 mg | ORAL_TABLET | Freq: Every morning | ORAL | Status: DC
  Administered 2018-08-11 – 2018-08-17 (×7): 7.5 mg via ORAL
  Filled 2018-08-10 (×7): qty 1

## 2018-08-10 MED ORDER — IPRATROPIUM-ALBUTEROL 0.5-2.5 (3) MG/3ML IN SOLN
3.0000 mL | Freq: Once | RESPIRATORY_TRACT | Status: AC
  Administered 2018-08-10: 3 mL via RESPIRATORY_TRACT
  Filled 2018-08-10: qty 3

## 2018-08-10 MED ORDER — ENSURE ENLIVE PO LIQD
237.0000 mL | Freq: Two times a day (BID) | ORAL | Status: DC
  Administered 2018-08-11 – 2018-08-17 (×14): 237 mL via ORAL

## 2018-08-10 MED ORDER — AMLODIPINE BESYLATE 5 MG PO TABS
10.0000 mg | ORAL_TABLET | Freq: Every day | ORAL | Status: DC
  Administered 2018-08-11 – 2018-08-17 (×7): 10 mg via ORAL
  Filled 2018-08-10 (×7): qty 2

## 2018-08-10 MED ORDER — SODIUM CHLORIDE 0.9% FLUSH
3.0000 mL | INTRAVENOUS | Status: DC | PRN
  Administered 2018-08-12: 3 mL via INTRAVENOUS
  Filled 2018-08-10: qty 3

## 2018-08-10 MED ORDER — METOPROLOL TARTRATE 50 MG PO TABS: 50.0000 mg | ORAL_TABLET | ORAL | Status: DC

## 2018-08-10 MED ORDER — PREDNISONE 10 MG PO TABS: 40.0000 mg | ORAL_TABLET | Freq: Every day | ORAL | 0 refills | Status: DC

## 2018-08-10 MED ORDER — SODIUM CHLORIDE 0.9% FLUSH
3.0000 mL | Freq: Two times a day (BID) | INTRAVENOUS | Status: DC
  Administered 2018-08-11 – 2018-08-17 (×11): 3 mL via INTRAVENOUS

## 2018-08-10 MED ORDER — METOPROLOL TARTRATE 50 MG PO TABS
150.0000 mg | ORAL_TABLET | Freq: Every morning | ORAL | Status: DC
  Administered 2018-08-11 – 2018-08-17 (×7): 150 mg via ORAL
  Filled 2018-08-10 (×7): qty 3

## 2018-08-10 MED ORDER — ASPIRIN EC 81 MG PO TBEC
81.0000 mg | DELAYED_RELEASE_TABLET | Freq: Every morning | ORAL | Status: DC
  Administered 2018-08-11 – 2018-08-17 (×7): 81 mg via ORAL
  Filled 2018-08-10 (×7): qty 1

## 2018-08-10 MED ORDER — SODIUM CHLORIDE 0.9 % IV SOLN: 250.0000 mL | INTRAVENOUS | Status: DC | PRN

## 2018-08-10 MED ORDER — LORATADINE 10 MG PO TABS
10.0000 mg | ORAL_TABLET | Freq: Every day | ORAL | Status: DC
  Administered 2018-08-11 – 2018-08-17 (×7): 10 mg via ORAL
  Filled 2018-08-10 (×7): qty 1

## 2018-08-10 MED ORDER — GUAIFENESIN ER 600 MG PO TB12
600.0000 mg | ORAL_TABLET | Freq: Two times a day (BID) | ORAL | Status: DC
  Administered 2018-08-11 – 2018-08-17 (×14): 600 mg via ORAL
  Filled 2018-08-10 (×14): qty 1

## 2018-08-10 MED ORDER — ROSUVASTATIN CALCIUM 20 MG PO TABS
40.0000 mg | ORAL_TABLET | Freq: Every day | ORAL | Status: DC
  Administered 2018-08-11 – 2018-08-16 (×6): 40 mg via ORAL
  Filled 2018-08-10 (×6): qty 2

## 2018-08-10 NOTE — ED Triage Notes (Signed)
Increasing SOB x 2-3 days. Chest tightness. History of COPD. wearingf 3l [place] instead of 2l the past few days.

## 2018-08-10 NOTE — ED Provider Notes (Signed)
[facility] EMERGENCY DEPARTMENT Provider Note   CSN: [id number] Arrival date & time: 08/10/18  1711     History   Chief Complaint Chief Complaint  Patient presents with  . Shortness of Breath    HPI Vincent Gilbert is a 72 y.o. male.  Patient with 2 days of worse breathing cough feeling like he cannot get the phlegm up.  Patient normally wears anywhere from 2 to 3 L of oxygen on a regular basis.  He has a history of COPD.  Patient not on steroids.  Patient does have nebulizer treatments at home.  Patient gets very short of breath when he has to move around.     Past Medical History:  Diagnosis Date  . Carotid artery occlusion   . COPD (chronic obstructive pulmonary disease) ([place])   . Coronary artery disease   . GERD (gastroesophageal reflux disease)   . Heart murmur   . Hypertension   . Myocardial infarction ([place]) 12/12  . Stroke [facility]) 09/10/2011    Patient Active Problem List   Diagnosis Date Noted  . Acute on chronic respiratory failure with hypoxia ([place]) 03/24/2018  . COPD with acute exacerbation ([place]) 03/24/2018  . Protein-calorie malnutrition, severe 03/22/2018  . CAP (community acquired pneumonia) 03/19/2018  . Occlusion and stenosis of carotid artery without mention of cerebral infarction 03/30/2012  . Occlusion and stenosis of carotid artery with cerebral infarction 03/08/2012  . Bruit 01/06/2012  . Coronary artery disease   . Hypertension 09/10/2011  . Tobacco abuse 09/10/2011  . Alcohol abuse 09/10/2011  . GERD (gastroesophageal reflux disease) 09/10/2011  . Acute myocardial infarction of inferoposterior wall, initial episode of care [facility]) 09/10/2011    Past Surgical History:  Procedure Laterality Date  . CARDIAC CATHETERIZATION  09-15-2011   90% mid RCA stenosis. Normal EF  . CORONARY ANGIOPLASTY WITH STENT PLACEMENT  09-15-2011   mid RCA: 3.5 X32 mm Promus DES  . ENDARTERECTOMY  03/11/2012   Procedure: ENDARTERECTOMY CAROTID;  Surgeon: Mal Misty, MD;  Location: [place];  Service: Vascular;  Laterality: Right;  . EYE SURGERY  rt  . LEFT HEART CATHETERIZATION WITH CORONARY ANGIOGRAM N/A 09/15/2011   Procedure: LEFT HEART CATHETERIZATION WITH CORONARY ANGIOGRAM;  Surgeon: Sherren Mocha, MD;  Location: [facility] CATH LAB;  Service: Cardiovascular;  Laterality: N/A;  . PERCUTANEOUS CORONARY STENT INTERVENTION (PCI-S) N/A 09/15/2011   Procedure: PERCUTANEOUS CORONARY STENT INTERVENTION (PCI-S);  Surgeon: Sherren Mocha, MD;  Location: [facility] CATH LAB;  Service: Cardiovascular;  Laterality: N/A;  . VASECTOMY    . YAG LASER APPLICATION Right 7/40/8144   Procedure: YAG LASER APPLICATION;  Surgeon: Williams Che, MD;  Location: [facility];  Service: Ophthalmology;  Laterality: Right;        Home Medications    Prior to Admission medications   Medication Sig Start Date End Date Taking? Authorizing Provider  albuterol (PROVENTIL HFA;VENTOLIN HFA) 108 (90 Base) MCG/ACT inhaler Inhale 2 puffs into the lungs every 6 (six) hours as needed for wheezing or shortness of breath.   Yes [provider]  amLODipine (NORVASC) 10 MG tablet Take 10 mg by mouth daily.   Yes [provider]  aspirin EC 81 MG tablet Take 81 mg by mouth every morning.    Yes [provider]  budesonide-formoterol (SYMBICORT) 160-4.5 MCG/ACT inhaler Inhale 2 puffs into the lungs 2 (two) times daily.   Yes [provider]  cetirizine (ZYRTEC) 10 MG tablet Take 10 mg by mouth daily.  Yes [provider]  feeding supplement, ENSURE ENLIVE, (ENSURE ENLIVE) LIQD Take 237 mLs by mouth 2 (two) times daily between meals. 03/26/18  Yes Tat, Shanon Brow, MD  fluticasone (FLONASE) 50 MCG/ACT nasal spray Place 2 sprays into both nostrils daily. 11/15/14  Yes [provider]  guaiFENesin (MUCINEX) 600 MG 12 hr tablet Take 600 mg by mouth 2 (two) times daily.    Yes [provider]  ipratropium-albuterol (DUONEB) 0.5-2.5 (3) MG/3ML SOLN Take  3 mLs by nebulization every 6 (six) hours as needed (shortness of breath and wheeze). 03/27/18  Yes Tat, Shanon Brow, MD  lisinopril (PRINIVIL,ZESTRIL) 40 MG tablet Take 1 tablet (40 mg total) by mouth daily. 05/04/15  Yes BranchAlphonse Guild, MD  magnesium oxide (MAG-OX) 400 (241.3 Mg) MG tablet Take 1 tablet (400 mg total) by mouth daily. Patient taking differently: Take 500 mg by mouth daily.  03/27/18  Yes Tat, Shanon Brow, MD  metoprolol (LOPRESSOR) 100 MG tablet TAKE 1 TABLET (100 MG TOTAL) BY MOUTH 2 (TWO) TIMES DAILY. Patient taking differently: Take 50-100 mg by mouth See admin instructions. Take 150mg  every morning and 50mg  every night 07/28/16  Yes Branch, Alphonse Guild, MD  mirtazapine (REMERON) 7.5 MG tablet Take 7.5 mg by mouth every morning.  03/08/18  Yes [provider]  Multiple Vitamin (MULTIVITAMIN) tablet Take 1 tablet by mouth daily.   Yes [provider]  oral electrolytes (THERMOTABS) TABS tablet Take 1 tablet by mouth daily.   Yes [provider]  pantoprazole (PROTONIX) 40 MG tablet Take 40 mg by mouth every morning.    Yes [provider]  rosuvastatin (CRESTOR) 40 MG tablet Take 40 mg by mouth daily after supper.   Yes [provider]  Tiotropium Bromide Monohydrate (SPIRIVA RESPIMAT) 2.5 MCG/ACT AERS Inhale 2 puffs into the lungs every morning.   Yes [provider]  predniSONE (DELTASONE) 10 MG tablet Take 6 tablets (60 mg total) by mouth daily with breakfast. And decrease by one tablet daily Patient not taking: Reported on 04/09/2018 03/28/18   Orson Eva, MD  predniSONE (DELTASONE) 10 MG tablet Take 4 tablets (40 mg total) by mouth daily. 08/10/18   Fredia Sorrow, MD  tiotropium (SPIRIVA) 18 MCG inhalation capsule Place 18 mcg into inhaler and inhale daily.    [provider]    Family History Family History  Problem Relation Age of Onset  . Heart disease Mother   . Heart disease Father     Social History Social History    Tobacco Use  . Smoking status: Former Smoker    Packs/day: 0.75    Years: 40.00    Pack years: 30.00    Types: Cigarettes    Start date: 02/26/1956  . Smokeless tobacco: Former Systems developer    Quit date: 03/21/2018  . Tobacco comment: 1/2 pk per day  Substance Use Topics  . Alcohol use: Yes    Alcohol/week: 6.0 standard drinks    Types: 6 Cans of beer per week    Comment: daily  . Drug use: No     Allergies   Oxycodone and Codeine   Review of Systems Review of Systems  Constitutional: Negative for fever.  HENT: Positive for congestion.   Eyes: Negative for redness.  Respiratory: Positive for cough, shortness of breath and wheezing.   Cardiovascular: Positive for chest pain.  Gastrointestinal: Negative for abdominal pain.  Genitourinary: Negative for dysuria.  Musculoskeletal: Negative for back pain.  Skin: Negative for rash.  Neurological: Negative  for headaches.  Hematological: Does not bruise/bleed easily.  Psychiatric/Behavioral: Negative for confusion.     Physical Exam Updated Vital Signs BP (!) 130/59   Pulse 72   Temp 97.6 F (36.4 C)   Resp 11   Ht 1.753 m (5\' 9" )   Wt 57.2 kg   SpO2 97%   BMI 18.61 kg/m   Physical Exam  Constitutional: He is oriented to person, place, and time. He appears well-developed and well-nourished. No distress.  HENT:  Head: Normocephalic and atraumatic.  Mouth/Throat: Oropharynx is clear and moist.  Eyes: Pupils are equal, round, and reactive to light. Conjunctivae and EOM are normal.  Neck: Neck supple.  Cardiovascular: Normal rate and regular rhythm.  Pulmonary/Chest: Effort normal. He has wheezes.  Abdominal: Soft. Bowel sounds are normal. There is no tenderness.  Musculoskeletal: Normal range of motion. He exhibits no edema.  Neurological: He is alert and oriented to person, place, and time. No cranial nerve deficit. He exhibits normal muscle tone. Coordination normal.  Skin: Skin is warm.  Nursing note and vitals  reviewed.    ED Treatments / Results  Labs (all labs ordered are listed, but only abnormal results are displayed) Labs Reviewed  COMPREHENSIVE METABOLIC PANEL - Abnormal; Notable for the following components:      Result Value   Sodium 131 (*)    Potassium 3.4 (*)    Chloride 89 (*)    Glucose, Bld 108 (*)    BUN 6 (*)    All other components within normal limits  CBC WITH DIFFERENTIAL/PLATELET - Abnormal; Notable for the following components:   RBC 4.07 (*)    All other components within normal limits  TROPONIN I  URINALYSIS, ROUTINE W REFLEX MICROSCOPIC    EKG EKG Interpretation  Date/Time:  Tuesday August 10 2018 17:21:41 EST Ventricular Rate:  78 PR Interval:  158 QRS Duration: 86 QT Interval:  392 QTC Calculation: 446 R Axis:   95 Text Interpretation:  Normal sinus rhythm Rightward axis Borderline ECG Artifact Confirmed by Fredia Sorrow [phone]) on 08/10/2018 6:06:59 PM   Radiology Dg Chest 2 View  Result Date: 08/10/2018 CLINICAL DATA:  Worsening dyspnea times 2-3 days EXAM: CHEST - 2 VIEW COMPARISON:  Chest CT 03/23/2018 and PET-CT 06/02/2017. FINDINGS: Emphysematous hyperinflation of the lungs consistent with COPD with subpleural areas of fibrosis at the lung bases bilaterally. Ovoid nodular 13 x 21 mm density projecting over the left upper lobe is again noted, previously noted to be FDG negative on PET CT and may reflect an area parenchymal consolidation or scarring. No effusion or pulmonary edema. No aggressive osseous lesions. 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 Redemonstration of ovoid nodular density in the left upper lobe noted to be FDG negative on prior PET-CT from 06/02/2017 Electronically Signed   By: Ashley Royalty M.D.   On: 08/10/2018 17:53    Procedures Procedures (including critical care time)  Medications Ordered in ED Medications  ipratropium-albuterol (DUONEB) 0.5-2.5 (3) MG/3ML nebulizer solution 3 mL (3 mLs  Nebulization Given 08/10/18 1841)  methylPREDNISolone sodium succinate (SOLU-MEDROL) 125 mg/2 mL injection 125 mg (125 mg Intravenous Given 08/10/18 1833)  ipratropium-albuterol (DUONEB) 0.5-2.5 (3) MG/3ML nebulizer solution 3 mL (3 mLs Nebulization Given 08/10/18 1958)     Initial Impression / Assessment and Plan / ED Course  I have reviewed the triage vital signs and the nursing notes.  Pertinent labs & imaging results that were available during my care of the patient were reviewed by  me and considered in my medical decision making (see chart for details).   Patient's presentation consistent with an exacerbation of COPD.  Has received 2 nebulizer treatments here wheezing cleared also received Solu-Medrol 125 mg.  Chest x-ray negative.  Patient was walked on his normal amount of oxygen 2 to 3 L.  He became very short of breath and dropped his sats down below 90%.  Hospitalist called for admission.    Final Clinical Impressions(s) / ED Diagnoses   Final diagnoses:  COPD exacerbation [facility])    ED Discharge Orders         Ordered    predniSONE (DELTASONE) 10 MG tablet  Daily     08/10/18 2130           Fredia Sorrow, MD 08/11/18 [phone]

## 2018-08-10 NOTE — ED Notes (Signed)
CONTACT INFO:  Corey Skains (daughter): [phone] Tyler Deis (son-in-law): [phone]

## 2018-08-10 NOTE — ED Notes (Signed)
RT notified for neb tx. 

## 2018-08-10 NOTE — Discharge Instructions (Signed)
Continue use your nebulizer treatments at home every 6 hours.  Continue to use your oxygen.  Take the prednisone as directed for the next 5 days.  Make an appointment follow-up with your doctor.  Return for any new or worse symptoms.  Chest x-ray here today without any evidence of pneumonia.

## 2018-08-10 NOTE — ED Notes (Signed)
Ambulated pt with pulse ox and Oxygen in place 2 liter.O2 level started at 95% full circle around nursing station 02 level ended at 88%.

## 2018-08-10 NOTE — H&P (Signed)
TRH H&P    Patient Demographics:    Vincent Gilbert, is a 72 y.o. male  MRN: [id number]  DOB - July 17, 1946  Admit Date - 08/10/2018  Referring MD/NP/PA: Dr. Bobby Rumpf  Outpatient Primary MD for the patient is Glenda Chroman, MD  Patient coming from: Home  Chief complaint-shortness of breath   HPI:    Vincent Gilbert  is a 71 y.o. male, with history of COPD, chronic respiratory failure on 2 L of oxygen via nasal cannula, coronary disease status post stent placement, hypertension, tobacco abuse, GERD, stroke, hypertension, carotid artery occlusion status post right carotid endarterectomy came to hospital with worsening shortness of breath.  Patient says that he has been coughing up yellowish colored phlegm, also has been using nebulizer at home without any improvement in his breathing.  He will increase his oxygen to 3 L today which did not help.  He complains of dyspnea on exertion.  Denies chest pain. Denies nausea vomiting or diarrhea. Denies fever or dysuria. In the ED, patient was given Solu-Medrol 125 mg IV x1, nebulizer treatments with some relief.  Still patient got hypoxic on ambulation.    Review of systems:    In addition to the HPI above,    All other systems reviewed and are negative.   With Past History of the following :    Past Medical History:  Diagnosis Date  . Carotid artery occlusion   . COPD (chronic obstructive pulmonary disease) ([place])   . Coronary artery disease   . GERD (gastroesophageal reflux disease)   . Heart murmur   . Hypertension   . Myocardial infarction ([place]) 12/12  . Stroke [facility]) 09/10/2011      Past Surgical History:  Procedure Laterality Date  . CARDIAC CATHETERIZATION  09-15-2011   90% mid RCA stenosis. Normal EF  . CORONARY ANGIOPLASTY WITH STENT PLACEMENT  09-15-2011   mid RCA: 3.5 X32 mm Promus DES  . ENDARTERECTOMY  03/11/2012   Procedure:  ENDARTERECTOMY CAROTID;  Surgeon: Mal Misty, MD;  Location: [place];  Service: Vascular;  Laterality: Right;  . EYE SURGERY  rt  . LEFT HEART CATHETERIZATION WITH CORONARY ANGIOGRAM N/A 09/15/2011   Procedure: LEFT HEART CATHETERIZATION WITH CORONARY ANGIOGRAM;  Surgeon: Sherren Mocha, MD;  Location: [facility] CATH LAB;  Service: Cardiovascular;  Laterality: N/A;  . PERCUTANEOUS CORONARY STENT INTERVENTION (PCI-S) N/A 09/15/2011   Procedure: PERCUTANEOUS CORONARY STENT INTERVENTION (PCI-S);  Surgeon: Sherren Mocha, MD;  Location: [facility] CATH LAB;  Service: Cardiovascular;  Laterality: N/A;  . VASECTOMY    . YAG LASER APPLICATION Right 02/26/3015   Procedure: YAG LASER APPLICATION;  Surgeon: Williams Che, MD;  Location: [facility];  Service: Ophthalmology;  Laterality: Right;      Social History:      Social History   Tobacco Use  . Smoking status: Former Smoker    Packs/day: 0.75    Years: 40.00    Pack years: 30.00    Types: Cigarettes    Start date: 02/26/1956  . Smokeless tobacco: Former Systems developer  Quit date: 03/21/2018  . Tobacco comment: 1/2 pk per day  Substance Use Topics  . Alcohol use: Yes    Alcohol/week: 6.0 standard drinks    Types: 6 Cans of beer per week    Comment: daily       Family History :     Family History  Problem Relation Age of Onset  . Heart disease Mother   . Heart disease Father       Home Medications:   Prior to Admission medications   Medication Sig Start Date End Date Taking? Authorizing Provider  albuterol (PROVENTIL HFA;VENTOLIN HFA) 108 (90 Base) MCG/ACT inhaler Inhale 2 puffs into the lungs every 6 (six) hours as needed for wheezing or shortness of breath.   Yes [provider]  amLODipine (NORVASC) 10 MG tablet Take 10 mg by mouth daily.   Yes [provider]  aspirin EC 81 MG tablet Take 81 mg by mouth every morning.    Yes [provider]  budesonide-formoterol (SYMBICORT) 160-4.5 MCG/ACT inhaler Inhale 2 puffs  into the lungs 2 (two) times daily.   Yes [provider]  cetirizine (ZYRTEC) 10 MG tablet Take 10 mg by mouth daily.   Yes [provider]  feeding supplement, ENSURE ENLIVE, (ENSURE ENLIVE) LIQD Take 237 mLs by mouth 2 (two) times daily between meals. 03/26/18  Yes Tat, Shanon Brow, MD  fluticasone (FLONASE) 50 MCG/ACT nasal spray Place 2 sprays into both nostrils daily. 11/15/14  Yes [provider]  guaiFENesin (MUCINEX) 600 MG 12 hr tablet Take 600 mg by mouth 2 (two) times daily.    Yes [provider]  ipratropium-albuterol (DUONEB) 0.5-2.5 (3) MG/3ML SOLN Take 3 mLs by nebulization every 6 (six) hours as needed (shortness of breath and wheeze). 03/27/18  Yes Tat, Shanon Brow, MD  lisinopril (PRINIVIL,ZESTRIL) 40 MG tablet Take 1 tablet (40 mg total) by mouth daily. 05/04/15  Yes BranchAlphonse Guild, MD  magnesium oxide (MAG-OX) 400 (241.3 Mg) MG tablet Take 1 tablet (400 mg total) by mouth daily. Patient taking differently: Take 500 mg by mouth daily.  03/27/18  Yes Tat, Shanon Brow, MD  metoprolol (LOPRESSOR) 100 MG tablet TAKE 1 TABLET (100 MG TOTAL) BY MOUTH 2 (TWO) TIMES DAILY. Patient taking differently: Take 50-100 mg by mouth See admin instructions. Take '150mg'$  every morning and '50mg'$  every night 07/28/16  Yes Branch, Alphonse Guild, MD  mirtazapine (REMERON) 7.5 MG tablet Take 7.5 mg by mouth every morning.  03/08/18  Yes [provider]  Multiple Vitamin (MULTIVITAMIN) tablet Take 1 tablet by mouth daily.   Yes [provider]  oral electrolytes (THERMOTABS) TABS tablet Take 1 tablet by mouth daily.   Yes [provider]  pantoprazole (PROTONIX) 40 MG tablet Take 40 mg by mouth every morning.    Yes [provider]  rosuvastatin (CRESTOR) 40 MG tablet Take 40 mg by mouth daily after supper.   Yes [provider]  Tiotropium Bromide Monohydrate (SPIRIVA RESPIMAT) 2.5 MCG/ACT AERS Inhale 2 puffs into the lungs every morning.   Yes  [provider]  predniSONE (DELTASONE) 10 MG tablet Take 6 tablets (60 mg total) by mouth daily with breakfast. And decrease by one tablet daily Patient not taking: Reported on 04/09/2018 03/28/18   Orson Eva, MD  predniSONE (DELTASONE) 10 MG tablet Take 4 tablets (40 mg total) by mouth daily. 08/10/18   Fredia Sorrow, MD  tiotropium (SPIRIVA) 18 MCG inhalation capsule Place 18 mcg into inhaler and inhale daily.  [provider]     Allergies:     Allergies  Allergen Reactions  . Oxycodone     nausea  . Codeine Palpitations     Physical Exam:   Vitals  Blood pressure (!) 133/57, pulse 75, temperature 97.6 F (36.4 C), resp. rate 15, height '5\' 9"'$  (1.753 m), weight 57.2 kg, SpO2 96 %.  1.  General: Appears in no acute distress  2. Psychiatric:  Intact judgement and  insight, awake alert, oriented x 3.  3. Neurologic: No focal neurological deficits, all cranial nerves intact.Strength 5/5 all 4 extremities, sensation intact all 4 extremities, plantars down going.  4. Eyes :  anicteric sclerae, moist conjunctivae with no lid lag. PERRLA.  5. ENMT:  Oropharynx clear with moist mucous membranes and good dentition  6. Neck:  supple, no cervical lymphadenopathy appriciated, No thyromegaly  7. Respiratory : Normal respiratory effort, prolonged expiratory phase on auscultation, bilateral wheezing  8. Cardiovascular : RRR, no gallops, rubs or murmurs, no leg edema  9. Gastrointestinal:  Positive bowel sounds, abdomen soft, non-tender to palpation,no hepatosplenomegaly, no rigidity or guarding       10. Skin:  No cyanosis, normal texture and turgor, no rash, lesions or ulcers  11.Musculoskeletal:  Good muscle tone,  joints appear normal , no effusions,  normal range of motion    Data Review:    CBC Recent Labs  Lab 08/10/18 1747  WBC 7.4  HGB 13.2  HCT 39.9  PLT 303  MCV 98.0  MCH 32.4  MCHC 33.1  RDW 12.0  LYMPHSABS 1.2  MONOABS 1.0    EOSABS 0.1  BASOSABS 0.0   ------------------------------------------------------------------------------------------------------------------  Results for orders placed or performed during the hospital encounter of 08/10/18 (from the past 48 hour(s))  Comprehensive metabolic panel     Status: Abnormal   Collection Time: 08/10/18  5:47 PM  Result Value Ref Range   Sodium 131 (L) 135 - 145 mmol/L   Potassium 3.4 (L) 3.5 - 5.1 mmol/L   Chloride 89 (L) 98 - 111 mmol/L   CO2 32 22 - 32 mmol/L   Glucose, Bld 108 (H) 70 - 99 mg/dL   BUN 6 (L) 8 - 23 mg/dL   Creatinine, Ser 0.65 0.61 - 1.24 mg/dL   Calcium 9.1 8.9 - 10.3 mg/dL   Total Protein 7.9 6.5 - 8.1 g/dL   Albumin 3.8 3.5 - 5.0 g/dL   AST 25 15 - 41 U/L   ALT 13 0 - 44 U/L   Alkaline Phosphatase 117 38 - 126 U/L   Total Bilirubin 1.0 0.3 - 1.2 mg/dL   GFR calc non Af Amer >60 >60 mL/min   GFR calc Af Amer >60 >60 mL/min    Comment: (NOTE) The eGFR has been calculated using the CKD EPI equation. This calculation has not been validated in all clinical situations. eGFR's persistently <60 mL/min signify possible Chronic Kidney Disease.    Anion gap 10 5 - 15    Comment: Performed at [facility], [address], [place], [place] [address]  CBC WITH DIFFERENTIAL     Status: Abnormal   Collection Time: 08/10/18  5:47 PM  Result Value Ref Range   WBC 7.4 4.0 - 10.5 K/uL   RBC 4.07 (L) 4.22 - 5.81 MIL/uL   Hemoglobin 13.2 13.0 - 17.0 g/dL   HCT 39.9 39.0 - 52.0 %   MCV 98.0 80.0 - 100.0 fL   MCH 32.4 26.0 - 34.0 pg   MCHC  33.1 30.0 - 36.0 g/dL   RDW 12.0 11.5 - 15.5 %   Platelets 303 150 - 400 K/uL   nRBC 0.0 0.0 - 0.2 %   Neutrophils Relative % 67 %   Neutro Abs 5.0 1.7 - 7.7 K/uL   Lymphocytes Relative 16 %   Lymphs Abs 1.2 0.7 - 4.0 K/uL   Monocytes Relative 14 %   Monocytes Absolute 1.0 0.1 - 1.0 K/uL   Eosinophils Relative 2 %   Eosinophils Absolute 0.1 0.0 - 0.5 K/uL   Basophils Relative 1 %   Basophils Absolute  0.0 0.0 - 0.1 K/uL   Immature Granulocytes 0 %   Abs Immature Granulocytes 0.02 0.00 - 0.07 K/uL    Comment: Performed at [facility], [address] [place], [place] [address]  Troponin I - ONCE - STAT     Status: None   Collection Time: 08/10/18  5:47 PM  Result Value Ref Range   Troponin I <0.03 <0.03 ng/mL    Comment: Performed at [facility], [address], [place], [place] [address]    Chemistries  Recent Labs  Lab 08/10/18 1747  NA 131*  K 3.4*  CL 89*  CO2 32  GLUCOSE 108*  BUN 6*  CREATININE 0.65  CALCIUM 9.1  AST 25  ALT 13  ALKPHOS 117  BILITOT 1.0   ------------------------------------------------------------------------------------------------------------------  ------------------------------------------------------------------------------------------------------------------ GFR: Estimated Creatinine Clearance: 69.5 mL/min (by C-G formula based on SCr of 0.65 mg/dL). Liver Function Tests: Recent Labs  Lab 08/10/18 1747  AST 25  ALT 13  ALKPHOS 117  BILITOT 1.0  PROT 7.9  ALBUMIN 3.8   No results for input(s): LIPASE, AMYLASE in the last 168 hours. No results for input(s): AMMONIA in the last 168 hours. Coagulation Profile: No results for input(s): INR, PROTIME in the last 168 hours. Cardiac Enzymes: Recent Labs  Lab 08/10/18 1747  TROPONINI <0.03    --------------------------------------------------------------------------------------------------------------- Urine analysis:    Component Value Date/Time   COLORURINE YELLOW 03/10/2012 1312   APPEARANCEUR CLEAR 03/10/2012 1312   LABSPEC 1.017 03/10/2012 1312   PHURINE 6.5 03/10/2012 1312   GLUCOSEU NEGATIVE 03/10/2012 1312   HGBUR NEGATIVE 03/10/2012 1312   BILIRUBINUR NEGATIVE 03/10/2012 1312   KETONESUR NEGATIVE 03/10/2012 1312   PROTEINUR 100 (A) 03/10/2012 1312   UROBILINOGEN 1.0 03/10/2012 1312   NITRITE NEGATIVE 03/10/2012 1312   LEUKOCYTESUR NEGATIVE 03/10/2012 1312       Imaging Results:    Dg Chest 2 View  Result Date: 08/10/2018 CLINICAL DATA:  Worsening dyspnea times 2-3 days EXAM: CHEST - 2 VIEW COMPARISON:  Chest CT 03/23/2018 and PET-CT 06/02/2017. FINDINGS: Emphysematous hyperinflation of the lungs consistent with COPD with subpleural areas of fibrosis at the lung bases bilaterally. Ovoid nodular 13 x 21 mm density projecting over the left upper lobe is again noted, previously noted to be FDG negative on PET CT and may reflect an area parenchymal consolidation or scarring. No effusion or pulmonary edema. No aggressive osseous lesions. 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 Redemonstration of ovoid nodular density in the left upper lobe noted to be FDG negative on prior PET-CT from 06/02/2017 Electronically Signed   By: Ashley Royalty M.D.   On: 08/10/2018 17:53    My personal review of EKG: Rhythm NSR   Assessment & Plan:    Active Problems:   COPD exacerbation ([place])   1. Acute on chronic hypoxic respiratory failure-patient has COPD with interstitial fibrosis, he is on 2 L of  oxygen at home.  Will start Solu-Medrol 60 mg IV every 6 hours, DuoNeb nebulizers every 6 hours, Mucinex 1 tablet p.o. twice daily.   2. CAD status post stent placement-continue aspirin, Crestor  3. Hypertension-blood pressure stable, continue Lopressor, lisinopril, amlodipine  4. GERD, continue Protonix   DVT Prophylaxis-   Lovenox   AM Labs Ordered, also please review Full Orders  Family Communication: Admission, patients condition and plan of care including tests being ordered have been discussed with the patient  who indicate understanding and agree with the plan and Code Status.  Code Status: Full code  Admission status: Observation  Time spent in minutes : 60 minutes   Oswald Hillock M.D on 08/10/2018 at 11:12 PM  Between 7am to 7pm - Pager - [phone]. After 7pm go to [email or web address] - password [facility]  [facility] - Office   [phone]

## 2018-08-10 NOTE — ED Notes (Signed)
ED Provider at bedside. 

## 2018-08-11 DIAGNOSIS — J441 Chronic obstructive pulmonary disease with (acute) exacerbation: Secondary | ICD-10-CM | POA: Diagnosis not present

## 2018-08-11 DIAGNOSIS — J9621 Acute and chronic respiratory failure with hypoxia: Secondary | ICD-10-CM

## 2018-08-11 DIAGNOSIS — F1010 Alcohol abuse, uncomplicated: Secondary | ICD-10-CM

## 2018-08-11 DIAGNOSIS — E43 Unspecified severe protein-calorie malnutrition: Secondary | ICD-10-CM

## 2018-08-11 LAB — COMPREHENSIVE METABOLIC PANEL
ALT: 12 U/L (ref 0–44)
ANION GAP: 9 (ref 5–15)
AST: 19 U/L (ref 15–41)
Albumin: 3.3 g/dL — ABNORMAL LOW (ref 3.5–5.0)
Alkaline Phosphatase: 96 U/L (ref 38–126)
BILIRUBIN TOTAL: 0.7 mg/dL (ref 0.3–1.2)
BUN: 6 mg/dL — ABNORMAL LOW (ref 8–23)
CHLORIDE: 96 mmol/L — AB (ref 98–111)
CO2: 30 mmol/L (ref 22–32)
Calcium: 8.7 mg/dL — ABNORMAL LOW (ref 8.9–10.3)
Creatinine, Ser: 0.59 mg/dL — ABNORMAL LOW (ref 0.61–1.24)
GFR calc Af Amer: >60 mL/min (ref >60)
Glucose, Bld: 168 mg/dL — ABNORMAL HIGH (ref 70–99)
POTASSIUM: 4.5 mmol/L (ref 3.5–5.1)
Sodium: 135 mmol/L (ref 135–145)
TOTAL PROTEIN: 6.7 g/dL (ref 6.5–8.1)

## 2018-08-11 LAB — URINALYSIS, ROUTINE W REFLEX MICROSCOPIC
BILIRUBIN URINE: NEGATIVE
Glucose, UA: NEGATIVE mg/dL
Hgb urine dipstick: NEGATIVE
Ketones, ur: NEGATIVE mg/dL
Leukocytes, UA: NEGATIVE
NITRITE: NEGATIVE
Protein, ur: NEGATIVE mg/dL
Specific Gravity, Urine: 1.012 (ref 1.005–1.030)
pH: 8 (ref 5.0–8.0)

## 2018-08-11 LAB — CBC
HEMATOCRIT: 37.5 % — AB (ref 39.0–52.0)
Hemoglobin: 12.4 g/dL — ABNORMAL LOW (ref 13.0–17.0)
MCH: 32.5 pg (ref 26.0–34.0)
MCHC: 33.1 g/dL (ref 30.0–36.0)
MCV: 98.4 fL (ref 80.0–100.0)
NRBC: 0 % (ref 0.0–0.2)
PLATELETS: 274 10*3/uL (ref 150–400)
RBC: 3.81 MIL/uL — ABNORMAL LOW (ref 4.22–5.81)
RDW: 12 % (ref 11.5–15.5)
WBC: 4 10*3/uL (ref 4.0–10.5)

## 2018-08-11 LAB — RESPIRATORY PANEL BY PCR

## 2018-08-11 MED ORDER — BUDESONIDE 0.5 MG/2ML IN SUSP
0.5000 mg | Freq: Two times a day (BID) | RESPIRATORY_TRACT | Status: DC
  Administered 2018-08-11 – 2018-08-17 (×13): 0.5 mg via RESPIRATORY_TRACT
  Filled 2018-08-11 (×13): qty 2

## 2018-08-11 MED ORDER — ORAL CARE MOUTH RINSE
15.0000 mL | Freq: Two times a day (BID) | OROMUCOSAL | Status: DC
  Administered 2018-08-11 – 2018-08-17 (×7): 15 mL via OROMUCOSAL

## 2018-08-11 MED ORDER — LORAZEPAM 1 MG PO TABS
1.0000 mg | ORAL_TABLET | Freq: Once | ORAL | Status: AC
  Administered 2018-08-11: 1 mg via ORAL
  Filled 2018-08-11: qty 1

## 2018-08-11 MED ORDER — MAGNESIUM OXIDE 400 (241.3 MG) MG PO TABS
400.0000 mg | ORAL_TABLET | Freq: Every day | ORAL | Status: DC
  Administered 2018-08-11 – 2018-08-13 (×3): 400 mg via ORAL
  Filled 2018-08-11 (×3): qty 1

## 2018-08-11 MED ORDER — SALINE SPRAY 0.65 % NA SOLN: 1.0000 | NASAL | Status: DC | PRN

## 2018-08-11 MED ORDER — ACETYLCYSTEINE 20 % IN SOLN
1.0000 mL | Freq: Two times a day (BID) | RESPIRATORY_TRACT | Status: DC
  Administered 2018-08-11: 4 mL via RESPIRATORY_TRACT
  Administered 2018-08-11: 14:00:00 via RESPIRATORY_TRACT
  Administered 2018-08-12 – 2018-08-17 (×11): 1 mL via RESPIRATORY_TRACT
  Filled 2018-08-11 (×13): qty 4

## 2018-08-11 NOTE — Progress Notes (Signed)
PROGRESS NOTE  Vincent Gilbert HLK:[id number] DOB: January 19, 1946 DOA: 08/10/2018 PCP: Glenda Chroman, MD  Brief History:  72 year old male with a history of hypertension, coronary artery disease with history of MI, stroke, chronic respiratory failure on 2 L nasal cannula, severe protein calorie malnutrition, alcohol dependence presenting with 1-1/2-week history of worsening shortness of breath.  He states that he has had increasing coughing and some chest tightness associated with his coughing and shortness of breath.  He denies any hemoptysis, fever, chills, nausea, vomiting, diarrhea.  The patient was by himself, and he denies any sick contacts.  He quit smoking approximately 4 to 5 months prior to this admission.  He continues to drink a sixpack of beer daily.  Assessment/Plan: Acute on chronic respiratory failure with hypoxia -Secondary to COPD exacerbation  -Continue weaning oxygen -normally on 2 L at home -presently stable on3L  -wean oxygen to keep sat 88-92% -Personally reviewed chest x-ray--hyperinflation without consolidation or edema -Personally reviewed EKG--sinus rhythm, nonspecific T wave change  COPD exacerbation -increase Pulmicort dose -Continue duo nebs -continue IV solumedrol -03/23/2018 CT chest--severe emphysematous change, decreased size ofLULovoid density, scattered bilateral subpleural interstitial changes and densities in the bilateral lower lobes with interseptal thickening.  Essential hypertension -Continue amlodipine and lisinopril and metoprolol tartrate -controlled  Severe malnutrition -Continue Ensure  Hyperlipidemia -Continue statin  Alcohol abuse -No evidence of withdrawal -CIWA    Disposition Plan:   Home in 2-3 days  Family Communication: No  Family at bedside  Consultants:  none  Code Status:  FULL  DVT Prophylaxis:  [place] Heparin / [place] Lovenox   Procedures: As Listed in Progress Note  Above  Antibiotics: None      Subjective: Patient states that he is only breathing minimally better.  He continues to have dyspnea on exertion.  Denies any fevers, chills, chest pain, nausea, vomiting, diarrhea, abdominal pain but there is no dysuria hematuria.  Objective: Vitals:   08/11/18 0138 08/11/18 0618 08/11/18 0749 08/11/18 0750  BP:  126/62    Pulse:  79    Resp:      Temp:  (!) 97.5 F (36.4 C)    TempSrc:  Oral    SpO2: 97% 95% 99% 99%  Weight:      Height:        Intake/Output Summary (Last 24 hours) at 08/11/2018 0854 Last data filed at 08/10/2018 2204 Gross per 24 hour  Intake -  Output 600 ml  Net -600 ml   Weight change:  Exam:   General:  Pt is alert, follows commands appropriately, not in acute distress  HEENT: No icterus, No thrush, No neck mass, [place]/AT  Cardiovascular: RRR, S1/S2, no rubs, no gallops  Respiratory: Bilateral expiratory wheeze.  Diminished breath sounds bilateral.  Bibasilar crackles.   Abdomen: Soft/+BS, non tender, non distended, no guarding  Extremities: No edema, No lymphangitis, No petechiae, No rashes, no synovitis   Data Reviewed: I have personally reviewed following labs and imaging studies Basic Metabolic Panel: Recent Labs  Lab 08/10/18 1747 08/11/18 0504  NA 131* 135  K 3.4* 4.5  CL 89* 96*  CO2 32 30  GLUCOSE 108* 168*  BUN 6* 6*  CREATININE 0.65 0.59*  CALCIUM 9.1 8.7*   Liver Function Tests: Recent Labs  Lab 08/10/18 1747 08/11/18 0504  AST 25 19  ALT 13 12  ALKPHOS 117 96  BILITOT 1.0 0.7  PROT 7.9 6.7  ALBUMIN 3.8 3.3*  No results for input(s): LIPASE, AMYLASE in the last 168 hours. No results for input(s): AMMONIA in the last 168 hours. Coagulation Profile: No results for input(s): INR, PROTIME in the last 168 hours. CBC: Recent Labs  Lab 08/10/18 1747 08/11/18 0504  WBC 7.4 4.0  NEUTROABS 5.0  --   HGB 13.2 12.4*  HCT 39.9 37.5*  MCV 98.0 98.4  PLT 303 274   Cardiac  Enzymes: Recent Labs  Lab 08/10/18 1747  TROPONINI <0.03   BNP: Invalid input(s): POCBNP CBG: No results for input(s): GLUCAP in the last 168 hours. HbA1C: No results for input(s): HGBA1C in the last 72 hours. Urine analysis:    Component Value Date/Time   COLORURINE YELLOW 03/10/2012 1312   APPEARANCEUR CLEAR 03/10/2012 1312   LABSPEC 1.017 03/10/2012 1312   PHURINE 6.5 03/10/2012 1312   GLUCOSEU NEGATIVE 03/10/2012 1312   HGBUR NEGATIVE 03/10/2012 1312   BILIRUBINUR NEGATIVE 03/10/2012 1312   KETONESUR NEGATIVE 03/10/2012 1312   PROTEINUR 100 (A) 03/10/2012 1312   UROBILINOGEN 1.0 03/10/2012 1312   NITRITE NEGATIVE 03/10/2012 1312   LEUKOCYTESUR NEGATIVE 03/10/2012 1312   Sepsis Labs: @LABRCNTIP (procalcitonin:4,lacticidven:4) )No results found for this or any previous visit (from the past 240 hour(s)).   Scheduled Meds: . amLODipine  10 mg Oral Daily  . aspirin EC  81 mg Oral q morning - 10a  . budesonide (PULMICORT) nebulizer solution  0.5 mg Nebulization BID  . enoxaparin (LOVENOX) injection  40 mg Subcutaneous Q24H  . feeding supplement (ENSURE ENLIVE)  237 mL Oral BID BM  . fluticasone  2 spray Each Nare Daily  . guaiFENesin  600 mg Oral BID  . ipratropium-albuterol  3 mL Nebulization Q6H  . lisinopril  40 mg Oral Daily  . loratadine  10 mg Oral Daily  . magnesium oxide  400 mg Oral Daily  . mouth rinse  15 mL Mouth Rinse BID  . methylPREDNISolone (SOLU-MEDROL) injection  60 mg Intravenous Q6H  . metoprolol tartrate  150 mg Oral q morning - 10a  . metoprolol tartrate  50 mg Oral QHS  . mirtazapine  7.5 mg Oral q morning - 10a  . pantoprazole  40 mg Oral q morning - 10a  . rosuvastatin  40 mg Oral QPC supper  . sodium chloride flush  3 mL Intravenous Q12H   Continuous Infusions: . sodium chloride      Procedures/Studies: Dg Chest 2 View  Result Date: 08/10/2018 CLINICAL DATA:  Worsening dyspnea times 2-3 days EXAM: CHEST - 2 VIEW COMPARISON:  Chest CT  03/23/2018 and PET-CT 06/02/2017. FINDINGS: Emphysematous hyperinflation of the lungs consistent with COPD with subpleural areas of fibrosis at the lung bases bilaterally. Ovoid nodular 13 x 21 mm density projecting over the left upper lobe is again noted, previously noted to be FDG negative on PET CT and may reflect an area parenchymal consolidation or scarring. No effusion or pulmonary edema. No aggressive osseous lesions. 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 Redemonstration of ovoid nodular density in the left upper lobe noted to be FDG negative on prior PET-CT from 06/02/2017 Electronically Signed   By: Ashley Royalty M.D.   On: 08/10/2018 17:53    Orson Eva, DO  [facility] Pager [phone]  If 7PM-7AM, please contact night-coverage [email or web address] Password [email or web address] 08/11/2018, 8:54 AM   LOS: 0 days

## 2018-08-11 NOTE — Plan of Care (Signed)

## 2018-08-11 NOTE — Care Management Obs Status (Signed)
[place] NOTIFICATION   Patient Details  Name: Vincent Gilbert MRN: [id number] Date of Birth: [birth year]   Medicare Observation Status Notification Given:  Yes    Sherald Barge, RN 08/11/2018, 7:00 PM

## 2018-08-12 DIAGNOSIS — I252 Old myocardial infarction: Secondary | ICD-10-CM | POA: Diagnosis not present

## 2018-08-12 DIAGNOSIS — R918 Other nonspecific abnormal finding of lung field: Secondary | ICD-10-CM | POA: Diagnosis not present

## 2018-08-12 DIAGNOSIS — I2510 Atherosclerotic heart disease of native coronary artery without angina pectoris: Secondary | ICD-10-CM | POA: Diagnosis not present

## 2018-08-12 DIAGNOSIS — Z8673 Personal history of transient ischemic attack (TIA), and cerebral infarction without residual deficits: Secondary | ICD-10-CM | POA: Diagnosis not present

## 2018-08-12 DIAGNOSIS — Z66 Do not resuscitate: Secondary | ICD-10-CM | POA: Diagnosis not present

## 2018-08-12 DIAGNOSIS — R739 Hyperglycemia, unspecified: Secondary | ICD-10-CM | POA: Diagnosis not present

## 2018-08-12 DIAGNOSIS — Z87891 Personal history of nicotine dependence: Secondary | ICD-10-CM | POA: Diagnosis not present

## 2018-08-12 DIAGNOSIS — J441 Chronic obstructive pulmonary disease with (acute) exacerbation: Secondary | ICD-10-CM | POA: Diagnosis not present

## 2018-08-12 DIAGNOSIS — Z79899 Other long term (current) drug therapy: Secondary | ICD-10-CM | POA: Diagnosis not present

## 2018-08-12 DIAGNOSIS — E43 Unspecified severe protein-calorie malnutrition: Secondary | ICD-10-CM | POA: Diagnosis not present

## 2018-08-12 DIAGNOSIS — Z681 Body mass index (BMI) 19 or less, adult: Secondary | ICD-10-CM | POA: Diagnosis not present

## 2018-08-12 DIAGNOSIS — K219 Gastro-esophageal reflux disease without esophagitis: Secondary | ICD-10-CM | POA: Diagnosis present

## 2018-08-12 DIAGNOSIS — I739 Peripheral vascular disease, unspecified: Secondary | ICD-10-CM | POA: Diagnosis present

## 2018-08-12 DIAGNOSIS — Z955 Presence of coronary angioplasty implant and graft: Secondary | ICD-10-CM | POA: Diagnosis not present

## 2018-08-12 DIAGNOSIS — I10 Essential (primary) hypertension: Secondary | ICD-10-CM | POA: Diagnosis present

## 2018-08-12 DIAGNOSIS — R0602 Shortness of breath: Secondary | ICD-10-CM | POA: Diagnosis not present

## 2018-08-12 DIAGNOSIS — J9621 Acute and chronic respiratory failure with hypoxia: Secondary | ICD-10-CM | POA: Diagnosis not present

## 2018-08-12 DIAGNOSIS — E785 Hyperlipidemia, unspecified: Secondary | ICD-10-CM | POA: Diagnosis present

## 2018-08-12 DIAGNOSIS — Z7189 Other specified counseling: Secondary | ICD-10-CM | POA: Diagnosis not present

## 2018-08-12 DIAGNOSIS — E876 Hypokalemia: Secondary | ICD-10-CM | POA: Diagnosis not present

## 2018-08-12 DIAGNOSIS — J449 Chronic obstructive pulmonary disease, unspecified: Secondary | ICD-10-CM | POA: Diagnosis not present

## 2018-08-12 DIAGNOSIS — F1010 Alcohol abuse, uncomplicated: Secondary | ICD-10-CM | POA: Diagnosis not present

## 2018-08-12 DIAGNOSIS — Z7951 Long term (current) use of inhaled steroids: Secondary | ICD-10-CM | POA: Diagnosis not present

## 2018-08-12 DIAGNOSIS — Z9981 Dependence on supplemental oxygen: Secondary | ICD-10-CM | POA: Diagnosis not present

## 2018-08-12 DIAGNOSIS — Z7982 Long term (current) use of aspirin: Secondary | ICD-10-CM | POA: Diagnosis not present

## 2018-08-12 DIAGNOSIS — Z7952 Long term (current) use of systemic steroids: Secondary | ICD-10-CM | POA: Diagnosis not present

## 2018-08-12 LAB — BASIC METABOLIC PANEL
Anion gap: 8 (ref 5–15)
BUN: 12 mg/dL (ref 8–23)
CHLORIDE: 95 mmol/L — AB (ref 98–111)
CO2: 31 mmol/L (ref 22–32)
CREATININE: 0.59 mg/dL — AB (ref 0.61–1.24)
Calcium: 8.7 mg/dL — ABNORMAL LOW (ref 8.9–10.3)
GFR calc non Af Amer: >60 mL/min (ref >60)
Glucose, Bld: 171 mg/dL — ABNORMAL HIGH (ref 70–99)
POTASSIUM: 3.9 mmol/L (ref 3.5–5.1)
SODIUM: 134 mmol/L — AB (ref 135–145)

## 2018-08-12 LAB — TROPONIN I

## 2018-08-12 LAB — GLUCOSE, CAPILLARY
GLUCOSE-CAPILLARY: 237 mg/dL — AB (ref 70–99)
Glucose-Capillary: 209 mg/dL — ABNORMAL HIGH (ref 70–99)

## 2018-08-12 LAB — MAGNESIUM: MAGNESIUM: 1.6 mg/dL — AB (ref 1.7–2.4)

## 2018-08-12 LAB — HEMOGLOBIN A1C
HEMOGLOBIN A1C: 5.5 % (ref 4.8–5.6)
MEAN PLASMA GLUCOSE: 111.15 mg/dL

## 2018-08-12 MED ORDER — INSULIN ASPART 100 UNIT/ML ~~LOC~~ SOLN
0.0000 [IU] | Freq: Three times a day (TID) | SUBCUTANEOUS | Status: DC
  Administered 2018-08-12: 3 [IU] via SUBCUTANEOUS
  Administered 2018-08-13 (×2): 1 [IU] via SUBCUTANEOUS
  Administered 2018-08-13: 5 [IU] via SUBCUTANEOUS
  Administered 2018-08-14 (×2): 2 [IU] via SUBCUTANEOUS
  Administered 2018-08-14 – 2018-08-15 (×2): 1 [IU] via SUBCUTANEOUS
  Administered 2018-08-15 – 2018-08-16 (×3): 2 [IU] via SUBCUTANEOUS
  Administered 2018-08-16: 1 [IU] via SUBCUTANEOUS
  Administered 2018-08-17: 5 [IU] via SUBCUTANEOUS
  Administered 2018-08-17: 1 [IU] via SUBCUTANEOUS

## 2018-08-12 MED ORDER — ARFORMOTEROL TARTRATE 15 MCG/2ML IN NEBU
15.0000 ug | INHALATION_SOLUTION | Freq: Two times a day (BID) | RESPIRATORY_TRACT | Status: DC
  Administered 2018-08-12 – 2018-08-17 (×10): 15 ug via RESPIRATORY_TRACT
  Filled 2018-08-12 (×10): qty 2

## 2018-08-12 MED ORDER — MAGNESIUM SULFATE 2 GM/50ML IV SOLN
2.0000 g | Freq: Once | INTRAVENOUS | Status: AC
  Administered 2018-08-12: 2 g via INTRAVENOUS
  Filled 2018-08-12: qty 50

## 2018-08-12 NOTE — Progress Notes (Signed)
PROGRESS NOTE  Vincent Gilbert IFO:[id number] DOB: [birth year] DOA: 08/10/2018 PCP: Glenda Chroman, MD  Brief History:  72 year old male with a history of hypertension, coronary artery disease with history of MI, stroke, chronic respiratory failure on 2 L nasal cannula, severe protein calorie malnutrition, alcohol dependence presenting with 1-1/2-week history of worsening shortness of breath.  He states that he has had increasing coughing and some chest tightness associated with his coughing and shortness of breath.  He denies any hemoptysis, fever, chills, nausea, vomiting, diarrhea.  The patient was by himself, and he denies any sick contacts.  He quit smoking approximately 4 to 5 months prior to this admission.  He continues to drink a sixpack of beer daily.   Assessment/Plan: Acute on chronic respiratory failure with hypoxia -Secondary to COPD exacerbation  -Continue weaning oxygen -normally on 2 L at home -presently stable on3L  -wean oxygen to keep sat 88-92% -Personally reviewed chest x-ray--hyperinflation without consolidation or edema -Personally reviewed EKG--sinus rhythm, nonspecific T wave change -Check magnesium and phosphorus  COPD exacerbation -increase Pulmicort dose -Continue duo nebs -continue IV solumedrol -03/23/2018 CT chest--severe emphysematous change, decreased size ofLULovoid density, scattered bilateral subpleural interstitial changes and densities in the bilateral lower lobes with interseptal thickening. -continues to wheeze and remains dyspneic with minimal exertion -add brovana  Essential hypertension -Continue amlodipine and lisinopril and metoprolol tartrate -controlled  Severe malnutrition -Continue Ensure  Hyperlipidemia -Continue statin  Alcohol abuse -No evidence of withdrawal -CIWA  Hyperglycemia -Check hemoglobin A1c -NovoLog sliding scale  Atypical chest pain -Cycle troponins -EKG--personally reviewed--sinus  rhythm nonspecific T wave change    Disposition Plan:   Home in 2-3 days  Family Communication: No  Family at bedside  Consultants:  none  Code Status:  FULL  DVT Prophylaxis:  [place] Lovenox   Procedures: As Listed in Progress Note Above  Antibiotics: None       Subjective:  Patient continues to have shortness of breath with minimal exertion.  He continues to complain of a nonproductive cough.  He has some chest tightness.  He denies any nausea, vomiting, diarrhea, abdominal pain dysuria, hematuria.  He denies any fevers, chills, headache, neck pain.  He denies any hemoptysis. Objective: Vitals:   08/12/18 0757 08/12/18 0804 08/12/18 1426 08/12/18 1431  BP:    106/63  Pulse:    76  Resp:      Temp:      TempSrc:      SpO2: 98% 100% 98%   Weight:      Height:        Intake/Output Summary (Last 24 hours) at 08/12/2018 1755 Last data filed at 08/12/2018 1100 Gross per 24 hour  Intake 240 ml  Output 700 ml  Net -460 ml   Weight change:  Exam:   General:  Pt is alert, follows commands appropriately, not in acute distress  HEENT: No icterus, No thrush, No neck mass, [place]/AT  Cardiovascular: RRR, S1/S2, no rubs, no gallops  Respiratory: Diminished breath sounds bilateral.  Bibasilar wheeze.  Bibasilar rales.  Abdomen: Soft/+BS, non tender, non distended, no guarding  Extremities: No edema, No lymphangitis, No petechiae, No rashes, no synovitis   Data Reviewed: I have personally reviewed following labs and imaging studies Basic Metabolic Panel: Recent Labs  Lab 08/10/18 1747 08/11/18 0504 08/12/18 0507  NA 131* 135 134*  K 3.4* 4.5 3.9  CL 89* 96* 95*  CO2 32 30 31  GLUCOSE  108* 168* 171*  BUN 6* 6* 12  CREATININE 0.65 0.59* 0.59*  CALCIUM 9.1 8.7* 8.7*  MG  --   --  1.6*   Liver Function Tests: Recent Labs  Lab 08/10/18 1747 08/11/18 0504  AST 25 19  ALT 13 12  ALKPHOS 117 96  BILITOT 1.0 0.7  PROT 7.9 6.7  ALBUMIN 3.8 3.3*     No results for input(s): LIPASE, AMYLASE in the last 168 hours. No results for input(s): AMMONIA in the last 168 hours. Coagulation Profile: No results for input(s): INR, PROTIME in the last 168 hours. CBC: Recent Labs  Lab 08/10/18 1747 08/11/18 0504  WBC 7.4 4.0  NEUTROABS 5.0  --   HGB 13.2 12.4*  HCT 39.9 37.5*  MCV 98.0 98.4  PLT 303 274   Cardiac Enzymes: Recent Labs  Lab 08/10/18 1747 08/12/18 1631  TROPONINI <0.03 <0.03   BNP: Invalid input(s): POCBNP CBG: Recent Labs  Lab 08/12/18 1703  GLUCAP 237*   HbA1C: No results for input(s): HGBA1C in the last 72 hours. Urine analysis:    Component Value Date/Time   COLORURINE YELLOW 08/11/2018 0944   APPEARANCEUR HAZY (A) 08/11/2018 0944   LABSPEC 1.012 08/11/2018 0944   PHURINE 8.0 08/11/2018 0944   GLUCOSEU NEGATIVE 08/11/2018 0944   HGBUR NEGATIVE 08/11/2018 0944   BILIRUBINUR NEGATIVE 08/11/2018 0944   KETONESUR NEGATIVE 08/11/2018 0944   PROTEINUR NEGATIVE 08/11/2018 0944   UROBILINOGEN 1.0 03/10/2012 1312   NITRITE NEGATIVE 08/11/2018 0944   LEUKOCYTESUR NEGATIVE 08/11/2018 0944   Sepsis Labs: @LABRCNTIP (procalcitonin:4,lacticidven:4) ) Recent Results (from the past 240 hour(s))  Respiratory Panel by PCR     Status: None   Collection Time: 08/11/18  6:50 PM  Result Value Ref Range Status   Adenovirus NOT DETECTED NOT DETECTED Final   Coronavirus 229E NOT DETECTED NOT DETECTED Final   Coronavirus HKU1 NOT DETECTED NOT DETECTED Final   Coronavirus NL63 NOT DETECTED NOT DETECTED Final   Coronavirus OC43 NOT DETECTED NOT DETECTED Final   Metapneumovirus NOT DETECTED NOT DETECTED Final   Rhinovirus / Enterovirus NOT DETECTED NOT DETECTED Final   Influenza A NOT DETECTED NOT DETECTED Final   Influenza B NOT DETECTED NOT DETECTED Final   Parainfluenza Virus 1 NOT DETECTED NOT DETECTED Final   Parainfluenza Virus 2 NOT DETECTED NOT DETECTED Final   Parainfluenza Virus 3 NOT DETECTED NOT DETECTED  Final   Parainfluenza Virus 4 NOT DETECTED NOT DETECTED Final   Respiratory Syncytial Virus NOT DETECTED NOT DETECTED Final   Bordetella pertussis NOT DETECTED NOT DETECTED Final   Chlamydophila pneumoniae NOT DETECTED NOT DETECTED Final   Mycoplasma pneumoniae NOT DETECTED NOT DETECTED Final    Comment: Performed at [facility], [place]. [address], [place], [place] [address]     Scheduled Meds: . acetylcysteine  1 mL Nebulization BID  . amLODipine  10 mg Oral Daily  . arformoterol  15 mcg Nebulization BID  . aspirin EC  81 mg Oral q morning - 10a  . budesonide (PULMICORT) nebulizer solution  0.5 mg Nebulization BID  . enoxaparin (LOVENOX) injection  40 mg Subcutaneous Q24H  . feeding supplement (ENSURE ENLIVE)  237 mL Oral BID BM  . fluticasone  2 spray Each Nare Daily  . guaiFENesin  600 mg Oral BID  . insulin aspart  0-9 Units Subcutaneous TID WC  . ipratropium-albuterol  3 mL Nebulization Q6H  . lisinopril  40 mg Oral Daily  . loratadine  10 mg Oral  Daily  . magnesium oxide  400 mg Oral Daily  . mouth rinse  15 mL Mouth Rinse BID  . methylPREDNISolone (SOLU-MEDROL) injection  60 mg Intravenous Q6H  . metoprolol tartrate  150 mg Oral q morning - 10a  . metoprolol tartrate  50 mg Oral QHS  . mirtazapine  7.5 mg Oral q morning - 10a  . pantoprazole  40 mg Oral q morning - 10a  . rosuvastatin  40 mg Oral QPC supper  . sodium chloride flush  3 mL Intravenous Q12H   Continuous Infusions: . sodium chloride      Procedures/Studies: Dg Chest 2 View  Result Date: 08/10/2018 CLINICAL DATA:  Worsening dyspnea times 2-3 days EXAM: CHEST - 2 VIEW COMPARISON:  Chest CT 03/23/2018 and PET-CT 06/02/2017. FINDINGS: Emphysematous hyperinflation of the lungs consistent with COPD with subpleural areas of fibrosis at the lung bases bilaterally. Ovoid nodular 13 x 21 mm density projecting over the left upper lobe is again noted, previously noted to be FDG negative on PET CT and may reflect  an area parenchymal consolidation or scarring. No effusion or pulmonary edema. No aggressive osseous lesions. 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 Redemonstration of ovoid nodular density in the left upper lobe noted to be FDG negative on prior PET-CT from 06/02/2017 Electronically Signed   By: Ashley Royalty M.D.   On: 08/10/2018 17:53    Orson Eva, DO  [facility] Pager [phone]  If 7PM-7AM, please contact night-coverage [email or web address] Password [email or web address] 08/12/2018, 5:55 PM   LOS: 0 days

## 2018-08-12 NOTE — Plan of Care (Signed)

## 2018-08-13 ENCOUNTER — Inpatient Hospital Stay (HOSPITAL_COMMUNITY): Payer: Medicare Other

## 2018-08-13 DIAGNOSIS — R0602 Shortness of breath: Secondary | ICD-10-CM

## 2018-08-13 DIAGNOSIS — Z7189 Other specified counseling: Secondary | ICD-10-CM

## 2018-08-13 LAB — BASIC METABOLIC PANEL
Anion gap: 8 (ref 5–15)
BUN: 21 mg/dL (ref 8–23)
CHLORIDE: 95 mmol/L — AB (ref 98–111)
CO2: 31 mmol/L (ref 22–32)
Calcium: 8.4 mg/dL — ABNORMAL LOW (ref 8.9–10.3)
Creatinine, Ser: 0.75 mg/dL (ref 0.61–1.24)
GFR calc non Af Amer: >60 mL/min (ref >60)
Glucose, Bld: 205 mg/dL — ABNORMAL HIGH (ref 70–99)
POTASSIUM: 3.6 mmol/L (ref 3.5–5.1)
SODIUM: 134 mmol/L — AB (ref 135–145)

## 2018-08-13 LAB — D-DIMER, QUANTITATIVE (NOT AT ARMC): D DIMER QUANT: 0.49 ug{FEU}/mL (ref 0.00–0.50)

## 2018-08-13 LAB — GLUCOSE, CAPILLARY
GLUCOSE-CAPILLARY: 133 mg/dL — AB (ref 70–99)
GLUCOSE-CAPILLARY: 276 mg/dL — AB (ref 70–99)
GLUCOSE-CAPILLARY: 132 mg/dL — AB (ref 70–99)
Glucose-Capillary: 151 mg/dL — ABNORMAL HIGH (ref 70–99)

## 2018-08-13 LAB — MAGNESIUM: MAGNESIUM: 1.9 mg/dL (ref 1.7–2.4)

## 2018-08-13 LAB — PHOSPHORUS: PHOSPHORUS: 1.9 mg/dL — AB (ref 2.5–4.6)

## 2018-08-13 LAB — TROPONIN I: Troponin I: <0.03 ng/mL (ref <0.03)

## 2018-08-13 MED ORDER — LORAZEPAM 0.5 MG PO TABS
0.5000 mg | ORAL_TABLET | Freq: Four times a day (QID) | ORAL | Status: DC | PRN
  Administered 2018-08-13 – 2018-08-16 (×11): 0.5 mg via ORAL
  Filled 2018-08-13 (×11): qty 1

## 2018-08-13 MED ORDER — POTASSIUM PHOSPHATE MONOBASIC 500 MG PO TABS
500.0000 mg | ORAL_TABLET | Freq: Two times a day (BID) | ORAL | Status: DC
  Filled 2018-08-13 (×6): qty 1

## 2018-08-13 MED ORDER — K PHOS MONO-SOD PHOS DI & MONO 155-852-130 MG PO TABS
500.0000 mg | ORAL_TABLET | Freq: Two times a day (BID) | ORAL | Status: DC
  Administered 2018-08-13 – 2018-08-17 (×8): 500 mg via ORAL
  Filled 2018-08-13 (×9): qty 2

## 2018-08-13 MED ORDER — POTASSIUM PHOSPHATE MONOBASIC 500 MG PO TABS: 500.0000 mg | ORAL_TABLET | Freq: Two times a day (BID) | ORAL | Status: DC

## 2018-08-13 MED ORDER — MAGNESIUM OXIDE 400 (241.3 MG) MG PO TABS
400.0000 mg | ORAL_TABLET | Freq: Two times a day (BID) | ORAL | Status: DC
  Administered 2018-08-13 – 2018-08-17 (×8): 400 mg via ORAL
  Filled 2018-08-13 (×8): qty 1

## 2018-08-13 NOTE — Progress Notes (Signed)
PROGRESS NOTE  TAKUMI DIN NFA:[id number] DOB: 01-17-1946 DOA: 08/10/2018 PCP: Glenda Chroman, MD   Brief History:  72 year old male with a history of hypertension, coronary artery disease with history of MI, stroke, chronic respiratory failure on 2 L nasal cannula, severe protein calorie malnutrition, alcohol dependence presenting with 1-1/2-week history of worsening shortness of breath. He states that he has had increasing coughing and some chest tightness associated with his coughing and shortness of breath. He denies any hemoptysis, fever, chills, nausea, vomiting, diarrhea. The patient was by himself, and he denies any sick contacts. He quit smoking approximately 4 to 5 months prior to this admission. He continues to drink a sixpack of beer daily. The patient was slow to improve.  His therapy was gradually escalated.   Assessment/Plan: Acute on chronic respiratory failure with hypoxia -Secondary to COPD exacerbation  -Continue weaning oxygen -normally on 2 L at home -presently stable on3L  -wean oxygen to keep sat 88-92% -Personally reviewed chest x-ray--hyperinflation without consolidation or edema -Personally reviewed EKG--sinus rhythm, nonspecific T wave change -add flutter valve  COPD exacerbation -increasedPulmicortdose -Continue duo nebs -continue IV solumedrol -03/23/2018 CT chest--severe emphysematous change, decreased size ofLULovoid density, scattered bilateral subpleural interstitial changes and densities in the bilateral lower lobes with interseptal thickening. -continues to wheeze and remains dyspneic with minimal exertion -add brovana  Essential hypertension -Continue amlodipine and lisinopril and metoprolol tartrate -controlled  Severe malnutrition -Continue Ensure  Hyperlipidemia -Continue statin  Alcohol abuse -No evidence of withdrawal -CIWA  Hyperglycemia -Check hemoglobin A1c--5.5 -NovoLog sliding  scale  Atypical chest pain -Cycle troponins--neg x 3 -EKG--personally reviewed--sinus rhythm nonspecific T wave change -check D-dimer -echo  Hypomagnesemia/Hypophosphatemia -replete  Goals of Care -discussed with daughter and pt -changed to DNR Advance care planning, including the explanation and discussion of advance directives was carried out with the patient and family.  Code status including explanations of "Full Code" and "DNR" and alternatives were discussed in detail.  Discussion of end-of-life issues including but not limited palliative care, hospice care and the concept of hospice, other end-of-life care options, power of attorney for health care decisions, living wills, and physician orders for life-sustaining treatment were also discussed with the patient and family.  Total face to face time 16 minutes.     Disposition Plan: Home in 2-3days  Family Communication:daughter updated at bedside 11/22--Total time spent 35 minutes.  Greater than 50% spent face to face counseling and coordinating care.   Consultants:none  Code Status: FULL  DVT Prophylaxis: [place] Lovenox   Procedures: As Listed in Progress Note Above  Antibiotics: None    Subjective: Pt only feels a tiny amount better.  Has sob with any minimal exertion.  Has intermittent chest tightness.  No n/v/d, dizziness.  No f/c/headache  Objective: Vitals:   08/13/18 0755 08/13/18 0801 08/13/18 1358 08/13/18 1418  BP:    (!) 125/57  Pulse:    77  Resp:    18  Temp:    97.9 F (36.6 C)  TempSrc:    Oral  SpO2: 97% 97% 97% 98%  Weight:      Height:        Intake/Output Summary (Last 24 hours) at 08/13/2018 1433 Last data filed at 08/13/2018 1300 Gross per 24 hour  Intake 480 ml  Output -  Net 480 ml   Weight change:  Exam:   General:  Pt is alert, follows commands appropriately, not in acute distress  HEENT: No icterus, No thrush, No neck mass, [place]/AT  Cardiovascular: RRR,  S1/S2, no rubs, no gallops  Respiratory: bilateral scattered rhonchi.  Bilateral exp wheeze  Abdomen: Soft/+BS, non tender, non distended, no guarding  Extremities: No edema, No lymphangitis, No petechiae, No rashes, no synovitis   Data Reviewed: I have personally reviewed following labs and imaging studies Basic Metabolic Panel: Recent Labs  Lab 08/10/18 1747 08/11/18 0504 08/12/18 0507 08/13/18 0355  NA 131* 135 134* 134*  K 3.4* 4.5 3.9 3.6  CL 89* 96* 95* 95*  CO2 32 30 31 31   GLUCOSE 108* 168* 171* 205*  BUN 6* 6* 12 21  CREATININE 0.65 0.59* 0.59* 0.75  CALCIUM 9.1 8.7* 8.7* 8.4*  MG  --   --  1.6* 1.9  PHOS  --   --   --  1.9*   Liver Function Tests: Recent Labs  Lab 08/10/18 1747 08/11/18 0504  AST 25 19  ALT 13 12  ALKPHOS 117 96  BILITOT 1.0 0.7  PROT 7.9 6.7  ALBUMIN 3.8 3.3*   No results for input(s): LIPASE, AMYLASE in the last 168 hours. No results for input(s): AMMONIA in the last 168 hours. Coagulation Profile: No results for input(s): INR, PROTIME in the last 168 hours. CBC: Recent Labs  Lab 08/10/18 1747 08/11/18 0504  WBC 7.4 4.0  NEUTROABS 5.0  --   HGB 13.2 12.4*  HCT 39.9 37.5*  MCV 98.0 98.4  PLT 303 274   Cardiac Enzymes: Recent Labs  Lab 08/10/18 1747 08/12/18 1631 08/12/18 2141 08/13/18 0355  TROPONINI <0.03 <0.03 <0.03 <0.03   BNP: Invalid input(s): POCBNP CBG: Recent Labs  Lab 08/12/18 1703 08/12/18 2058 08/13/18 0734 08/13/18 1131  GLUCAP 237* 209* 133* 276*   HbA1C: Recent Labs    08/12/18 1631  HGBA1C 5.5   Urine analysis:    Component Value Date/Time   COLORURINE YELLOW 08/11/2018 0944   APPEARANCEUR HAZY (A) 08/11/2018 0944   LABSPEC 1.012 08/11/2018 0944   PHURINE 8.0 08/11/2018 [place] 08/11/2018 0944   HGBUR NEGATIVE 08/11/2018 [place] 08/11/2018 [place] 08/11/2018 0944   PROTEINUR NEGATIVE 08/11/2018 0944   UROBILINOGEN 1.0  03/10/2012 1312   NITRITE NEGATIVE 08/11/2018 0944   LEUKOCYTESUR NEGATIVE 08/11/2018 0944   Sepsis Labs: @LABRCNTIP (procalcitonin:4,lacticidven:4) ) Recent Results (from the past 240 hour(s))  Respiratory Panel by PCR     Status: None   Collection Time: 08/11/18  6:50 PM  Result Value Ref Range Status   Adenovirus NOT DETECTED NOT DETECTED Final   Coronavirus 229E NOT DETECTED NOT DETECTED Final   Coronavirus HKU1 NOT DETECTED NOT DETECTED Final   Coronavirus NL63 NOT DETECTED NOT DETECTED Final   Coronavirus OC43 NOT DETECTED NOT DETECTED Final   Metapneumovirus NOT DETECTED NOT DETECTED Final   Rhinovirus / Enterovirus NOT DETECTED NOT DETECTED Final   Influenza A NOT DETECTED NOT DETECTED Final   Influenza B NOT DETECTED NOT DETECTED Final   Parainfluenza Virus 1 NOT DETECTED NOT DETECTED Final   Parainfluenza Virus 2 NOT DETECTED NOT DETECTED Final   Parainfluenza Virus 3 NOT DETECTED NOT DETECTED Final   Parainfluenza Virus 4 NOT DETECTED NOT DETECTED Final   Respiratory Syncytial Virus NOT DETECTED NOT DETECTED Final   Bordetella pertussis NOT DETECTED NOT DETECTED Final   Chlamydophila pneumoniae NOT DETECTED NOT DETECTED Final   Mycoplasma pneumoniae NOT DETECTED NOT DETECTED Final    Comment: Performed at [facility], [place] [address] [place], [place] [address]     Scheduled Meds: . acetylcysteine  1 mL Nebulization BID  . amLODipine  10 mg Oral Daily  . arformoterol  15 mcg Nebulization BID  . aspirin EC  81 mg Oral q morning - 10a  . budesonide (PULMICORT) nebulizer solution  0.5 mg Nebulization BID  . enoxaparin (LOVENOX) injection  40 mg Subcutaneous Q24H  . feeding supplement (ENSURE ENLIVE)  237 mL Oral BID BM  . fluticasone  2 spray Each Nare Daily  . guaiFENesin  600 mg Oral BID  . insulin aspart  0-9 Units Subcutaneous TID WC  . ipratropium-albuterol  3 mL Nebulization Q6H  . lisinopril  40 mg Oral Daily  . loratadine  10 mg Oral Daily  .  magnesium oxide  400 mg Oral BID  . mouth rinse  15 mL Mouth Rinse BID  . methylPREDNISolone (SOLU-MEDROL) injection  60 mg Intravenous Q6H  . metoprolol tartrate  150 mg Oral q morning - 10a  . metoprolol tartrate  50 mg Oral QHS  . mirtazapine  7.5 mg Oral q morning - 10a  . pantoprazole  40 mg Oral q morning - 10a  . potassium phosphate (monobasic)  500 mg Oral BID WC  . rosuvastatin  40 mg Oral QPC supper  . sodium chloride flush  3 mL Intravenous Q12H   Continuous Infusions: . sodium chloride      Procedures/Studies: Dg Chest 2 View  Result Date: 08/10/2018 CLINICAL DATA:  Worsening dyspnea times 2-3 days EXAM: CHEST - 2 VIEW COMPARISON:  Chest CT 03/23/2018 and PET-CT 06/02/2017. FINDINGS: Emphysematous hyperinflation of the lungs consistent with COPD with subpleural areas of fibrosis at the lung bases bilaterally. Ovoid nodular 13 x 21 mm density projecting over the left upper lobe is again noted, previously noted to be FDG negative on PET CT and may reflect an area parenchymal consolidation or scarring. No effusion or pulmonary edema. No aggressive osseous lesions. 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 Redemonstration of ovoid nodular density in the left upper lobe noted to be FDG negative on prior PET-CT from 06/02/2017 Electronically Signed   By: Ashley Royalty M.D.   On: 08/10/2018 17:53    Orson Eva, DO  [facility] Pager [phone]  If 7PM-7AM, please contact night-coverage [email or web address] Password [facility] 08/13/2018, 2:33 PM   LOS: 1 day

## 2018-08-13 NOTE — Plan of Care (Signed)

## 2018-08-13 NOTE — Progress Notes (Signed)
  Echocardiogram 2D Echocardiogram has been performed.  Vincent Gilbert M 08/13/2018, 4:12 PM

## 2018-08-14 LAB — GLUCOSE, CAPILLARY
GLUCOSE-CAPILLARY: 175 mg/dL — AB (ref 70–99)
GLUCOSE-CAPILLARY: 295 mg/dL — AB (ref 70–99)
Glucose-Capillary: 199 mg/dL — ABNORMAL HIGH (ref 70–99)
Glucose-Capillary: 141 mg/dL — ABNORMAL HIGH (ref 70–99)

## 2018-08-14 LAB — BASIC METABOLIC PANEL
Anion gap: 9 (ref 5–15)
BUN: 18 mg/dL (ref 8–23)
CALCIUM: 8.5 mg/dL — AB (ref 8.9–10.3)
CO2: 34 mmol/L — AB (ref 22–32)
CREATININE: 0.68 mg/dL (ref 0.61–1.24)
Chloride: 92 mmol/L — ABNORMAL LOW (ref 98–111)
GFR calc Af Amer: >60 mL/min (ref >60)
GFR calc non Af Amer: >60 mL/min (ref >60)
GLUCOSE: 144 mg/dL — AB (ref 70–99)
Potassium: 3.5 mmol/L (ref 3.5–5.1)
Sodium: 135 mmol/L (ref 135–145)

## 2018-08-14 LAB — PHOSPHORUS: Phosphorus: 3.3 mg/dL (ref 2.5–4.6)

## 2018-08-14 LAB — ECHOCARDIOGRAM COMPLETE
HEIGHTINCHES: 69 in
WEIGHTICAEL: 2016 [oz_av]

## 2018-08-14 LAB — MAGNESIUM: Magnesium: 1.8 mg/dL (ref 1.7–2.4)

## 2018-08-14 NOTE — Progress Notes (Signed)
Patient was unavailable  at 0824 he was taking his medication and at 0839 he was eating. Nebulizer will be administered at a later time.

## 2018-08-14 NOTE — Progress Notes (Signed)
PROGRESS NOTE  Vincent Gilbert KGY:[id number] DOB: 1946-07-28 DOA: 08/10/2018 PCP: Glenda Chroman, MD  Brief History: 72 year old male with a history of hypertension, coronary artery disease with history of MI, stroke, chronic respiratory failure on 2 L nasal cannula, severe protein calorie malnutrition, alcohol dependence presenting with 1-1/2-week history of worsening shortness of breath. He states that he has had increasing coughing and some chest tightness associated with his coughing and shortness of breath. He denies any hemoptysis, fever, chills, nausea, vomiting, diarrhea. The patient was by himself, and he denies any sick contacts. He quit smoking approximately 4 to 5 months prior to this admission. He continues to drink a sixpack of beer daily. The patient was slow to improve.  His therapy was gradually escalated.   Assessment/Plan: Acute on chronic respiratory failure with hypoxia -Secondary to COPD exacerbation  -Continue weaning oxygen -normally on 2 L at home -presently stable on3L  -wean oxygen to keep sat 88-92% -Personally reviewed chest x-ray--hyperinflation without consolidation or edema -Personally reviewed EKG--sinus rhythm, nonspecific T wave change -add flutter valve -slow improvement; pt has very little reserve--remains dyspneic with minimal exertion  COPD exacerbation -increasedPulmicortdose -Continue duo nebs -continue IV solumedrol -03/23/2018 CT chest--severe emphysematous change, decreased size ofLULovoid density, scattered bilateral subpleural interstitial changes and densities in the bilateral lower lobes with interseptal thickening. -continues to wheeze and remains dyspneic with minimal exertion -continue brovana  Essential hypertension -Continue amlodipine and lisinopril and metoprolol tartrate -controlled  Severe malnutrition -Continue Ensure  Hyperlipidemia -Continue statin  Alcohol abuse -No evidence of  withdrawal -CIWA  Hyperglycemia -Check hemoglobin A1c--5.5 -NovoLog sliding scale  Atypical chest pain -Cycle troponins--neg x 3 -EKG--personally reviewed--sinus rhythm nonspecific T wave change -check D-dimer--0.49 -echo--EF 60-65%, no WMA, G1DD, trivial MR  Hypomagnesemia/Hypophosphatemia -replete  Goals of Care -discussed with daughter and pt -changed to DNR Advance care planning, including the explanation and discussion of advance directives was carried out with the patient and family.  Code status including explanations of "Full Code" and "DNR" and alternatives were discussed in detail.  Discussion of end-of-life issues including but not limited palliative care, hospice care and the concept of hospice, other end-of-life care options, power of attorney for health care decisions, living wills, and physician orders for life-sustaining treatment were also discussed with the patient and family.  Total face to face time 16 minutes.     Disposition Plan: Home in 2-3days  Family Communication:daughter updated at bedside 11/23--Total time spent 35 minutes.  Greater than 50% spent face to face counseling and coordinating care.   Consultants:none  Code Status: FULL  DVT Prophylaxis: [place] Lovenox   Procedures: As Listed in Progress Note Above  Antibiotics: None       Subjective: Pt states he can now walk to bathroom, but he still gets significant dyspnea.  Denies f/c, cp, n/v/d, abd pain.  Nonproductive cough not much better  Objective: Vitals:   08/14/18 0603 08/14/18 0948 08/14/18 1352 08/14/18 1426  BP: (!) 138/56  (!) 126/50   Pulse: 85  79   Resp:   19   Temp: 98.1 F (36.7 C)  97.9 F (36.6 C)   TempSrc: Oral  Oral   SpO2: 97% 97% 94% 97%  Weight:      Height:        Intake/Output Summary (Last 24 hours) at 08/14/2018 1619 Last data filed at 08/14/2018 0900 Gross per 24 hour  Intake 600 ml  Output -  Net 600 ml   Weight  change:  Exam:   General:  Pt is alert, follows commands appropriately, not in acute distress  HEENT: No icterus, No thrush, No neck mass, [place]/AT  Cardiovascular: RRR, S1/S2, no rubs, no gallops  Respiratory: diminished breath sounds bilateral.  Bilateral rhonchi.  Bibasilar wheeze  Abdomen: Soft/+BS, non tender, non distended, no guarding  Extremities: No edema, No lymphangitis, No petechiae, No rashes, no synovitis   Data Reviewed: I have personally reviewed following labs and imaging studies Basic Metabolic Panel: Recent Labs  Lab 08/10/18 1747 08/11/18 0504 08/12/18 0507 08/13/18 0355 08/14/18 0612  NA 131* 135 134* 134* 135  K 3.4* 4.5 3.9 3.6 3.5  CL 89* 96* 95* 95* 92*  CO2 32 30 31 31  34*  GLUCOSE 108* 168* 171* 205* 144*  BUN 6* 6* 12 21 18   CREATININE 0.65 0.59* 0.59* 0.75 0.68  CALCIUM 9.1 8.7* 8.7* 8.4* 8.5*  MG  --   --  1.6* 1.9 1.8  PHOS  --   --   --  1.9* 3.3   Liver Function Tests: Recent Labs  Lab 08/10/18 1747 08/11/18 0504  AST 25 19  ALT 13 12  ALKPHOS 117 96  BILITOT 1.0 0.7  PROT 7.9 6.7  ALBUMIN 3.8 3.3*   No results for input(s): LIPASE, AMYLASE in the last 168 hours. No results for input(s): AMMONIA in the last 168 hours. Coagulation Profile: No results for input(s): INR, PROTIME in the last 168 hours. CBC: Recent Labs  Lab 08/10/18 1747 08/11/18 0504  WBC 7.4 4.0  NEUTROABS 5.0  --   HGB 13.2 12.4*  HCT 39.9 37.5*  MCV 98.0 98.4  PLT 303 274   Cardiac Enzymes: Recent Labs  Lab 08/10/18 1747 08/12/18 1631 08/12/18 2141 08/13/18 0355  TROPONINI <0.03 <0.03 <0.03 <0.03   BNP: Invalid input(s): POCBNP CBG: Recent Labs  Lab 08/13/18 1131 08/13/18 1638 08/13/18 2121 08/14/18 0743 08/14/18 1055  GLUCAP 276* 132* 151* 175* 199*   HbA1C: Recent Labs    08/12/18 1631  HGBA1C 5.5   Urine analysis:    Component Value Date/Time   COLORURINE YELLOW 08/11/2018 0944   APPEARANCEUR HAZY (A) 08/11/2018 0944    LABSPEC 1.012 08/11/2018 0944   PHURINE 8.0 08/11/2018 0944   GLUCOSEU NEGATIVE 08/11/2018 0944   HGBUR NEGATIVE 08/11/2018 0944   BILIRUBINUR NEGATIVE 08/11/2018 [place] 08/11/2018 0944   PROTEINUR NEGATIVE 08/11/2018 0944   UROBILINOGEN 1.0 03/10/2012 1312   NITRITE NEGATIVE 08/11/2018 0944   LEUKOCYTESUR NEGATIVE 08/11/2018 0944   Sepsis Labs: @LABRCNTIP (procalcitonin:4,lacticidven:4) ) Recent Results (from the past 240 hour(s))  Respiratory Panel by PCR     Status: None   Collection Time: 08/11/18  6:50 PM  Result Value Ref Range Status   Adenovirus NOT DETECTED NOT DETECTED Final   Coronavirus 229E NOT DETECTED NOT DETECTED Final   Coronavirus HKU1 NOT DETECTED NOT DETECTED Final   Coronavirus NL63 NOT DETECTED NOT DETECTED Final   Coronavirus OC43 NOT DETECTED NOT DETECTED Final   Metapneumovirus NOT DETECTED NOT DETECTED Final   Rhinovirus / Enterovirus NOT DETECTED NOT DETECTED Final   Influenza A NOT DETECTED NOT DETECTED Final   Influenza B NOT DETECTED NOT DETECTED Final   Parainfluenza Virus 1 NOT DETECTED NOT DETECTED Final   Parainfluenza Virus 2 NOT DETECTED NOT DETECTED Final   Parainfluenza Virus 3 NOT DETECTED NOT DETECTED Final   Parainfluenza Virus 4 NOT DETECTED NOT DETECTED Final  Respiratory Syncytial Virus NOT DETECTED NOT DETECTED Final   Bordetella pertussis NOT DETECTED NOT DETECTED Final   Chlamydophila pneumoniae NOT DETECTED NOT DETECTED Final   Mycoplasma pneumoniae NOT DETECTED NOT DETECTED Final    Comment: Performed at [facility], [place] [address], [place], [place] [address]     Scheduled Meds: . acetylcysteine  1 mL Nebulization BID  . amLODipine  10 mg Oral Daily  . arformoterol  15 mcg Nebulization BID  . aspirin EC  81 mg Oral q morning - 10a  . budesonide (PULMICORT) nebulizer solution  0.5 mg Nebulization BID  . enoxaparin (LOVENOX) injection  40 mg Subcutaneous Q24H  . feeding supplement (ENSURE ENLIVE)   237 mL Oral BID BM  . fluticasone  2 spray Each Nare Daily  . guaiFENesin  600 mg Oral BID  . insulin aspart  0-9 Units Subcutaneous TID WC  . ipratropium-albuterol  3 mL Nebulization Q6H  . lisinopril  40 mg Oral Daily  . loratadine  10 mg Oral Daily  . magnesium oxide  400 mg Oral BID  . mouth rinse  15 mL Mouth Rinse BID  . methylPREDNISolone (SOLU-MEDROL) injection  60 mg Intravenous Q6H  . metoprolol tartrate  150 mg Oral q morning - 10a  . metoprolol tartrate  50 mg Oral QHS  . mirtazapine  7.5 mg Oral q morning - 10a  . pantoprazole  40 mg Oral q morning - 10a  . phosphorus  500 mg Oral BID WC  . rosuvastatin  40 mg Oral QPC supper  . sodium chloride flush  3 mL Intravenous Q12H   Continuous Infusions: . sodium chloride      Procedures/Studies: Dg Chest 2 View  Result Date: 08/10/2018 CLINICAL DATA:  Worsening dyspnea times 2-3 days EXAM: CHEST - 2 VIEW COMPARISON:  Chest CT 03/23/2018 and PET-CT 06/02/2017. FINDINGS: Emphysematous hyperinflation of the lungs consistent with COPD with subpleural areas of fibrosis at the lung bases bilaterally. Ovoid nodular 13 x 21 mm density projecting over the left upper lobe is again noted, previously noted to be FDG negative on PET CT and may reflect an area parenchymal consolidation or scarring. No effusion or pulmonary edema. No aggressive osseous lesions. 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 Redemonstration of ovoid nodular density in the left upper lobe noted to be FDG negative on prior PET-CT from 06/02/2017 Electronically Signed   By: Ashley Royalty M.D.   On: 08/10/2018 17:53    Orson Eva, DO  [facility] Pager [phone]  If 7PM-7AM, please contact night-coverage [email or web address] Password [email or web address] 08/14/2018, 4:19 PM   LOS: 2 days

## 2018-08-15 ENCOUNTER — Inpatient Hospital Stay (HOSPITAL_COMMUNITY): Payer: Medicare Other

## 2018-08-15 LAB — BASIC METABOLIC PANEL
ANION GAP: 9 (ref 5–15)
BUN: 15 mg/dL (ref 8–23)
CALCIUM: 8.5 mg/dL — AB (ref 8.9–10.3)
CO2: 37 mmol/L — AB (ref 22–32)
Chloride: 90 mmol/L — ABNORMAL LOW (ref 98–111)
Creatinine, Ser: 0.56 mg/dL — ABNORMAL LOW (ref 0.61–1.24)
Glucose, Bld: 132 mg/dL — ABNORMAL HIGH (ref 70–99)
Potassium: 3.4 mmol/L — ABNORMAL LOW (ref 3.5–5.1)
SODIUM: 136 mmol/L (ref 135–145)

## 2018-08-15 LAB — PHOSPHORUS: Phosphorus: 3.1 mg/dL (ref 2.5–4.6)

## 2018-08-15 LAB — GLUCOSE, CAPILLARY
GLUCOSE-CAPILLARY: 188 mg/dL — AB (ref 70–99)
GLUCOSE-CAPILLARY: 184 mg/dL — AB (ref 70–99)
Glucose-Capillary: 128 mg/dL — ABNORMAL HIGH (ref 70–99)
Glucose-Capillary: 140 mg/dL — ABNORMAL HIGH (ref 70–99)

## 2018-08-15 LAB — MAGNESIUM: MAGNESIUM: 1.9 mg/dL (ref 1.7–2.4)

## 2018-08-15 MED ORDER — IOPAMIDOL (ISOVUE-300) INJECTION 61%
100.0000 mL | Freq: Once | INTRAVENOUS | Status: AC | PRN
  Administered 2018-08-15: 75 mL via INTRAVENOUS

## 2018-08-15 MED ORDER — IOHEXOL 300 MG/ML  SOLN: 75.0000 mL | Freq: Once | INTRAMUSCULAR | Status: DC | PRN

## 2018-08-15 MED ORDER — IOPAMIDOL (ISOVUE-M 300) INJECTION 61%: 15.0000 mL | Freq: Once | INTRAMUSCULAR | Status: DC | PRN

## 2018-08-15 NOTE — Progress Notes (Addendum)
PROGRESS NOTE  Vincent Gilbert SNK:[id number] DOB: July 14, 1946 DOA: 08/10/2018 PCP: Glenda Chroman, MD  Brief History: 72 year old male with a history of hypertension, coronary artery disease with history of MI, stroke, chronic respiratory failure on 2 L nasal cannula, severe protein calorie malnutrition, alcohol dependence presenting with 1-1/2-week history of worsening shortness of breath. He states that he has had increasing coughing and some chest tightness associated with his coughing and shortness of breath. He denies any hemoptysis, fever, chills, nausea, vomiting, diarrhea. The patient was by himself, and he denies any sick contacts. He quit smoking approximately 4 to 5 months prior to this admission. He continues to drink a sixpack of beer daily. The patient was slow to improve. His therapy was gradually escalated.   Assessment/Plan: Acute on chronic respiratory failure with hypoxia -Secondary to COPD exacerbation  -Continue weaning oxygen -normally on 2 L at home -presently stable on3L  -wean oxygen to keep sat 88-92% -Personally reviewed chest x-ray--hyperinflation without consolidation or edema -Personally reviewed EKG--sinus rhythm, nonspecific T wave change -added flutter valve -slow improvement; pt has very little reserve--remains dyspneic with minimal exertion -CT chest; pt had CT chest in July 2019 with new basilar opacities  COPD exacerbation -increasedPulmicortdose -Continue duo nebs -continue IV solumedrol -viral respiratory panel--neg -03/23/2018 CT chest--severe emphysematous change, decreased size ofLULovoid density, scattered bilateral subpleural interstitial changes and densities in the bilateral lower lobes with interseptal thickening. -continues to wheeze and remains dyspneic with minimal exertion -continue brovana  Essential hypertension -Continue amlodipine and lisinopril and metoprolol tartrate -controlled  Severe  malnutrition -Continue Ensure  Hyperlipidemia -Continue statin  Alcohol abuse -No evidence of withdrawal -CIWA  Hyperglycemia -Check hemoglobin A1c--5.5 -NovoLog sliding scale  Atypical chest pain -Cycle troponins--neg x 3 -EKG--personally reviewed--sinus rhythm nonspecific T wave change -check D-dimer--0.49 -echo--EF 60-65%, no WMA, G1DD, trivial MR  Hypomagnesemia/Hypophosphatemia -replete  Goals of Care -discussed with daughter and pt -changed to DNR Advance care planning, including the explanation and discussion of advance directives was carried out with the patient and family. Code status including explanations of "Full Code" and "DNR" and alternatives were discussed in detail. Discussion of end-of-life issues including but not limited palliative care, hospice care and the concept of hospice, other end-of-life care options, power of attorney for health care decisions, living wills, and physician orders for life-sustaining treatment were also discussed with the patient and family. Total face to face time 16 minutes.     Disposition Plan: Home in 2-3days  Family Communication:daughter updated at bedside 11/24  Consultants:none  Code Status: FULL  DVT Prophylaxis: [place] Lovenox   Procedures: As Listed in Progress Note Above  Antibiotics: None          Subjective: Pt overall breathing slightly better.  Becomes sob with minimal activity and performing ADLs.  At rest, he is comfortable.  Denies cp, n/v/d, abd pain, dysuria, hematuria.  Objective: Vitals:   08/15/18 0500 08/15/18 0741 08/15/18 1348 08/15/18 1421  BP: (!) 163/70   138/74  Pulse: 87   81  Resp: 20   20  Temp: 97.9 F (36.6 C)   97.6 F (36.4 C)  TempSrc: Oral   Oral  SpO2: 100% 97% 97% 100%  Weight:      Height:        Intake/Output Summary (Last 24 hours) at 08/15/2018 1620 Last data filed at 08/15/2018 0100 Gross per 24 hour  Intake 600 ml    Output -  Net 600 ml   Weight change:  Exam:   General:  Pt is alert, follows commands appropriately, not in acute distress  HEENT: No icterus, No thrush, No neck mass, [place]/AT  Cardiovascular: RRR, S1/S2, no rubs, no gallops; No JVD  Respiratory: diminished breath sounds.  Scattered bilateral rales.  Basilar wheeze  Abdomen: Soft/+BS, non tender, non distended, no guarding  Extremities: trace LE edema, No lymphangitis, No petechiae, No rashes, no synovitis   Data Reviewed: I have personally reviewed following labs and imaging studies Basic Metabolic Panel: Recent Labs  Lab 08/11/18 0504 08/12/18 0507 08/13/18 0355 08/14/18 0612 08/15/18 0641  NA 135 134* 134* 135 136  K 4.5 3.9 3.6 3.5 3.4*  CL 96* 95* 95* 92* 90*  CO2 30 31 31  34* 37*  GLUCOSE 168* 171* 205* 144* 132*  BUN 6* 12 21 18 15   CREATININE 0.59* 0.59* 0.75 0.68 0.56*  CALCIUM 8.7* 8.7* 8.4* 8.5* 8.5*  MG  --  1.6* 1.9 1.8 1.9  PHOS  --   --  1.9* 3.3 3.1   Liver Function Tests: Recent Labs  Lab 08/10/18 1747 08/11/18 0504  AST 25 19  ALT 13 12  ALKPHOS 117 96  BILITOT 1.0 0.7  PROT 7.9 6.7  ALBUMIN 3.8 3.3*   No results for input(s): LIPASE, AMYLASE in the last 168 hours. No results for input(s): AMMONIA in the last 168 hours. Coagulation Profile: No results for input(s): INR, PROTIME in the last 168 hours. CBC: Recent Labs  Lab 08/10/18 1747 08/11/18 0504  WBC 7.4 4.0  NEUTROABS 5.0  --   HGB 13.2 12.4*  HCT 39.9 37.5*  MCV 98.0 98.4  PLT 303 274   Cardiac Enzymes: Recent Labs  Lab 08/10/18 1747 08/12/18 1631 08/12/18 2141 08/13/18 0355  TROPONINI <0.03 <0.03 <0.03 <0.03   BNP: Invalid input(s): POCBNP CBG: Recent Labs  Lab 08/14/18 1055 08/14/18 1636 08/14/18 2159 08/15/18 0736 08/15/18 1105  GLUCAP 199* 141* 295* 128* 140*   HbA1C: Recent Labs    08/12/18 1631  HGBA1C 5.5   Urine analysis:    Component Value Date/Time   COLORURINE YELLOW 08/11/2018 0944    APPEARANCEUR HAZY (A) 08/11/2018 0944   LABSPEC 1.012 08/11/2018 0944   PHURINE 8.0 08/11/2018 0944   GLUCOSEU NEGATIVE 08/11/2018 0944   HGBUR NEGATIVE 08/11/2018 0944   BILIRUBINUR NEGATIVE 08/11/2018 [place] 08/11/2018 0944   PROTEINUR NEGATIVE 08/11/2018 0944   UROBILINOGEN 1.0 03/10/2012 1312   NITRITE NEGATIVE 08/11/2018 0944   LEUKOCYTESUR NEGATIVE 08/11/2018 0944   Sepsis Labs: @LABRCNTIP (procalcitonin:4,lacticidven:4) ) Recent Results (from the past 240 hour(s))  Respiratory Panel by PCR     Status: None   Collection Time: 08/11/18  6:50 PM  Result Value Ref Range Status   Adenovirus NOT DETECTED NOT DETECTED Final   Coronavirus 229E NOT DETECTED NOT DETECTED Final   Coronavirus HKU1 NOT DETECTED NOT DETECTED Final   Coronavirus NL63 NOT DETECTED NOT DETECTED Final   Coronavirus OC43 NOT DETECTED NOT DETECTED Final   Metapneumovirus NOT DETECTED NOT DETECTED Final   Rhinovirus / Enterovirus NOT DETECTED NOT DETECTED Final   Influenza A NOT DETECTED NOT DETECTED Final   Influenza B NOT DETECTED NOT DETECTED Final   Parainfluenza Virus 1 NOT DETECTED NOT DETECTED Final   Parainfluenza Virus 2 NOT DETECTED NOT DETECTED Final   Parainfluenza Virus 3 NOT DETECTED NOT DETECTED Final   Parainfluenza Virus 4 NOT DETECTED NOT DETECTED Final   Respiratory Syncytial  Virus NOT DETECTED NOT DETECTED Final   Bordetella pertussis NOT DETECTED NOT DETECTED Final   Chlamydophila pneumoniae NOT DETECTED NOT DETECTED Final   Mycoplasma pneumoniae NOT DETECTED NOT DETECTED Final    Comment: Performed at [facility], [place] [address], [place], [place] [address]     Scheduled Meds: . acetylcysteine  1 mL Nebulization BID  . amLODipine  10 mg Oral Daily  . arformoterol  15 mcg Nebulization BID  . aspirin EC  81 mg Oral q morning - 10a  . budesonide (PULMICORT) nebulizer solution  0.5 mg Nebulization BID  . enoxaparin (LOVENOX) injection  40 mg Subcutaneous  Q24H  . feeding supplement (ENSURE ENLIVE)  237 mL Oral BID BM  . fluticasone  2 spray Each Nare Daily  . guaiFENesin  600 mg Oral BID  . insulin aspart  0-9 Units Subcutaneous TID WC  . ipratropium-albuterol  3 mL Nebulization Q6H  . lisinopril  40 mg Oral Daily  . loratadine  10 mg Oral Daily  . magnesium oxide  400 mg Oral BID  . mouth rinse  15 mL Mouth Rinse BID  . methylPREDNISolone (SOLU-MEDROL) injection  60 mg Intravenous Q6H  . metoprolol tartrate  150 mg Oral q morning - 10a  . metoprolol tartrate  50 mg Oral QHS  . mirtazapine  7.5 mg Oral q morning - 10a  . pantoprazole  40 mg Oral q morning - 10a  . phosphorus  500 mg Oral BID WC  . rosuvastatin  40 mg Oral QPC supper  . sodium chloride flush  3 mL Intravenous Q12H   Continuous Infusions: . sodium chloride      Procedures/Studies: Dg Chest 2 View  Result Date: 08/10/2018 CLINICAL DATA:  Worsening dyspnea times 2-3 days EXAM: CHEST - 2 VIEW COMPARISON:  Chest CT 03/23/2018 and PET-CT 06/02/2017. FINDINGS: Emphysematous hyperinflation of the lungs consistent with COPD with subpleural areas of fibrosis at the lung bases bilaterally. Ovoid nodular 13 x 21 mm density projecting over the left upper lobe is again noted, previously noted to be FDG negative on PET CT and may reflect an area parenchymal consolidation or scarring. No effusion or pulmonary edema. No aggressive osseous lesions. 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 Redemonstration of ovoid nodular density in the left upper lobe noted to be FDG negative on prior PET-CT from 06/02/2017 Electronically Signed   By: Ashley Royalty M.D.   On: 08/10/2018 17:53    Orson Eva, DO  [facility] Pager [phone]  If 7PM-7AM, please contact night-coverage [email or web address] Password [email or web address] 08/15/2018, 4:20 PM   LOS: 3 days

## 2018-08-16 ENCOUNTER — Ambulatory Visit: Payer: Self-pay

## 2018-08-16 DIAGNOSIS — I2510 Atherosclerotic heart disease of native coronary artery without angina pectoris: Secondary | ICD-10-CM

## 2018-08-16 LAB — BASIC METABOLIC PANEL
Anion gap: 12 (ref 5–15)
BUN: 13 mg/dL (ref 8–23)
CALCIUM: 8.1 mg/dL — AB (ref 8.9–10.3)
CO2: 33 mmol/L — AB (ref 22–32)
CREATININE: 0.74 mg/dL (ref 0.61–1.24)
Chloride: 87 mmol/L — ABNORMAL LOW (ref 98–111)
GFR calc non Af Amer: >60 mL/min (ref >60)
Glucose, Bld: 218 mg/dL — ABNORMAL HIGH (ref 70–99)
Potassium: 3 mmol/L — ABNORMAL LOW (ref 3.5–5.1)
SODIUM: 132 mmol/L — AB (ref 135–145)

## 2018-08-16 LAB — GLUCOSE, CAPILLARY
GLUCOSE-CAPILLARY: 209 mg/dL — AB (ref 70–99)
Glucose-Capillary: 139 mg/dL — ABNORMAL HIGH (ref 70–99)
Glucose-Capillary: 185 mg/dL — ABNORMAL HIGH (ref 70–99)
Glucose-Capillary: 162 mg/dL — ABNORMAL HIGH (ref 70–99)

## 2018-08-16 LAB — PHOSPHORUS: PHOSPHORUS: 4.3 mg/dL (ref 2.5–4.6)

## 2018-08-16 LAB — MAGNESIUM: Magnesium: 1.7 mg/dL (ref 1.7–2.4)

## 2018-08-16 MED ORDER — MAGNESIUM SULFATE 2 GM/50ML IV SOLN
2.0000 g | Freq: Once | INTRAVENOUS | Status: AC
  Administered 2018-08-16: 2 g via INTRAVENOUS
  Filled 2018-08-16: qty 50

## 2018-08-16 MED ORDER — POTASSIUM CHLORIDE CRYS ER 20 MEQ PO TBCR
20.0000 meq | EXTENDED_RELEASE_TABLET | Freq: Once | ORAL | Status: AC
  Administered 2018-08-16: 20 meq via ORAL
  Filled 2018-08-16: qty 1

## 2018-08-16 NOTE — Care Management Important Message (Signed)
Important Message  Patient Details  Name: Vincent Gilbert MRN: [id number] Date of Birth: September 15, 1946   Medicare Important Message Given:  Yes    [name] 08/16/2018, 1:02 PM

## 2018-08-16 NOTE — Progress Notes (Addendum)
PROGRESS NOTE  Vincent Gilbert ALP:[id number] DOB: Nov 07, 1945 DOA: 08/10/2018 PCP: Glenda Chroman, MD   Brief History: 72 year old male with a history of hypertension, coronary artery disease with history of MI, stroke, chronic respiratory failure on 2 L nasal cannula, severe protein calorie malnutrition, alcohol dependence presenting with 1-1/2-week history of worsening shortness of breath. He states that he has had increasing coughing and some chest tightness associated with his coughing and shortness of breath. He denies any hemoptysis, fever, chills, nausea, vomiting, diarrhea. The patient was by himself, and he denies any sick contacts. He quit smoking approximately 4 to 5 months prior to this admission. He continues to drink a sixpack of beer daily. The patient was slow to improve. His therapy was gradually escalated.   Assessment/Plan: Acute on chronic respiratory failure with hypoxia -Secondary to COPD exacerbation  -Continue weaning oxygen -normally on 2 L at home -presently stable on3L  -wean oxygen to keep sat 88-92% -Personally reviewed chest x-ray--hyperinflation without consolidation or edema -Personally reviewed EKG--sinus rhythm, nonspecific T wave change -added flutter valve -slow improvement; pt has very little reserve--remains dyspneic with minimal exertion -11/24--CT chest--3.-0 x 2.8 cm LLL mass abuts L-pulm artery; increase size in RLL nodule;  All concerning for malignancy -consult pulmonary for best approach going foward  COPD exacerbation -increasedPulmicortdose -Continue duo nebs -continue IV solumedrol -viral respiratory panel--neg -03/23/2018 CT chest--severe emphysematous change, decreased size ofLULovoid density, scattered bilateral subpleural interstitial changes and densities in the bilateral lower lobes with interseptal thickening. -continues to wheeze and remains dyspneic with minimal exertion -continuebrovana  Essential  hypertension -Continue amlodipine and lisinopril and metoprolol tartrate -BP remains acceptable  Severe malnutrition -Continue Ensure  Hyperlipidemia -Continue statin  Alcohol abuse -No evidence of withdrawal  Hyperglycemia -Check hemoglobin A1c--5.5 -NovoLog sliding scale  Atypical chest pain -Cycle troponins--neg x 3 -EKG--personally reviewed--sinus rhythm nonspecific T wave change -check D-dimer--0.49 -echo--EF 60-65%, no WMA, G1DD, trivial MR  Hypomagnesemia/Hypophosphatemia/Hypokalemia -replete  Goals of Care -discussed with daughter and pt -changed to DNR Advance care planning, including the explanation and discussion of advance directives was carried out with the patient and family. Code status including explanations of "Full Code" and "DNR" and alternatives were discussed in detail. Discussion of end-of-life issues including but not limited palliative care, hospice care and the concept of hospice, other end-of-life care options, power of attorney for health care decisions, living wills, and physician orders for life-sustaining treatment were also discussed with the patient and family. Total face to face time 16 minutes. -consult palliative     Disposition Plan: Home in 2-3days  Family Communication:daughter updated at bedside 11/25--Total time spent 35 minutes.  Greater than 50% spent face to face counseling and coordinating care.   Consultants:none  Code Status: FULL  DVT Prophylaxis: [place] Lovenox   Procedures: As Listed in Progress Note Above  Antibiotics: None     Subjective: Pt feeling better today.  He is able to shave with only mild sob, but still getting out of breath by the time he is done.  He did not desaturate.  Denies cp, n/v/d, abd pain, f/c.  Still has nonproductive cough.  Objective: Vitals:   08/16/18 0756 08/16/18 0809 08/16/18 0813 08/16/18 0827  BP:    (!) 172/77  Pulse:    90  Resp:      Temp:        TempSrc:      SpO2: 99% 100% 100%   Weight:  Height:        Intake/Output Summary (Last 24 hours) at 08/16/2018 1211 Last data filed at 08/15/2018 1700 Gross per 24 hour  Intake 840 ml  Output -  Net 840 ml   Weight change:  Exam:   General:  Pt is alert, follows commands appropriately, not in acute distress  HEENT: No icterus, No thrush, No neck mass, [place]/AT  Cardiovascular: RRR, S1/S2, no rubs, no gallops  Respiratory: bibasilar rales.  Diminished breath sounds.  Minimal basilar wheeze  Abdomen: Soft/+BS, non tender, non distended, no guarding  Extremities: trace LE edema, No lymphangitis, No petechiae, No rashes, no synovitis   Data Reviewed: I have personally reviewed following labs and imaging studies Basic Metabolic Panel: Recent Labs  Lab 08/12/18 0507 08/13/18 0355 08/14/18 0612 08/15/18 0641 08/16/18 1038  NA 134* 134* 135 136 132*  K 3.9 3.6 3.5 3.4* 3.0*  CL 95* 95* 92* 90* 87*  CO2 31 31 34* 37* 33*  GLUCOSE 171* 205* 144* 132* 218*  BUN 12 21 18 15 13   CREATININE 0.59* 0.75 0.68 0.56* 0.74  CALCIUM 8.7* 8.4* 8.5* 8.5* 8.1*  MG 1.6* 1.9 1.8 1.9 1.7  PHOS  --  1.9* 3.3 3.1 4.3   Liver Function Tests: Recent Labs  Lab 08/10/18 1747 08/11/18 0504  AST 25 19  ALT 13 12  ALKPHOS 117 96  BILITOT 1.0 0.7  PROT 7.9 6.7  ALBUMIN 3.8 3.3*   No results for input(s): LIPASE, AMYLASE in the last 168 hours. No results for input(s): AMMONIA in the last 168 hours. Coagulation Profile: No results for input(s): INR, PROTIME in the last 168 hours. CBC: Recent Labs  Lab 08/10/18 1747 08/11/18 0504  WBC 7.4 4.0  NEUTROABS 5.0  --   HGB 13.2 12.4*  HCT 39.9 37.5*  MCV 98.0 98.4  PLT 303 274   Cardiac Enzymes: Recent Labs  Lab 08/10/18 1747 08/12/18 1631 08/12/18 2141 08/13/18 0355  TROPONINI <0.03 <0.03 <0.03 <0.03   BNP: Invalid input(s): POCBNP CBG: Recent Labs  Lab 08/15/18 1105 08/15/18 1640 08/15/18 2310 08/16/18 0801  08/16/18 1141  GLUCAP 140* 188* 184* 139* 185*   HbA1C: No results for input(s): HGBA1C in the last 72 hours. Urine analysis:    Component Value Date/Time   COLORURINE YELLOW 08/11/2018 0944   APPEARANCEUR HAZY (A) 08/11/2018 0944   LABSPEC 1.012 08/11/2018 0944   PHURINE 8.0 08/11/2018 0944   GLUCOSEU NEGATIVE 08/11/2018 0944   HGBUR NEGATIVE 08/11/2018 0944   BILIRUBINUR NEGATIVE 08/11/2018 [place] 08/11/2018 0944   PROTEINUR NEGATIVE 08/11/2018 0944   UROBILINOGEN 1.0 03/10/2012 1312   NITRITE NEGATIVE 08/11/2018 0944   LEUKOCYTESUR NEGATIVE 08/11/2018 0944   Sepsis Labs: @LABRCNTIP (procalcitonin:4,lacticidven:4) ) Recent Results (from the past 240 hour(s))  Respiratory Panel by PCR     Status: None   Collection Time: 08/11/18  6:50 PM  Result Value Ref Range Status   Adenovirus NOT DETECTED NOT DETECTED Final   Coronavirus 229E NOT DETECTED NOT DETECTED Final   Coronavirus HKU1 NOT DETECTED NOT DETECTED Final   Coronavirus NL63 NOT DETECTED NOT DETECTED Final   Coronavirus OC43 NOT DETECTED NOT DETECTED Final   Metapneumovirus NOT DETECTED NOT DETECTED Final   Rhinovirus / Enterovirus NOT DETECTED NOT DETECTED Final   Influenza A NOT DETECTED NOT DETECTED Final   Influenza B NOT DETECTED NOT DETECTED Final   Parainfluenza Virus 1 NOT DETECTED NOT DETECTED Final   Parainfluenza Virus 2 NOT DETECTED NOT  DETECTED Final   Parainfluenza Virus 3 NOT DETECTED NOT DETECTED Final   Parainfluenza Virus 4 NOT DETECTED NOT DETECTED Final   Respiratory Syncytial Virus NOT DETECTED NOT DETECTED Final   Bordetella pertussis NOT DETECTED NOT DETECTED Final   Chlamydophila pneumoniae NOT DETECTED NOT DETECTED Final   Mycoplasma pneumoniae NOT DETECTED NOT DETECTED Final    Comment: Performed at [facility], [place] [address] [place], [place] [address]     Scheduled Meds: . acetylcysteine  1 mL Nebulization BID  . amLODipine  10 mg Oral Daily  .  arformoterol  15 mcg Nebulization BID  . aspirin EC  81 mg Oral q morning - 10a  . budesonide (PULMICORT) nebulizer solution  0.5 mg Nebulization BID  . enoxaparin (LOVENOX) injection  40 mg Subcutaneous Q24H  . feeding supplement (ENSURE ENLIVE)  237 mL Oral BID BM  . fluticasone  2 spray Each Nare Daily  . guaiFENesin  600 mg Oral BID  . insulin aspart  0-9 Units Subcutaneous TID WC  . ipratropium-albuterol  3 mL Nebulization Q6H  . lisinopril  40 mg Oral Daily  . loratadine  10 mg Oral Daily  . magnesium oxide  400 mg Oral BID  . mouth rinse  15 mL Mouth Rinse BID  . methylPREDNISolone (SOLU-MEDROL) injection  60 mg Intravenous Q6H  . metoprolol tartrate  150 mg Oral q morning - 10a  . metoprolol tartrate  50 mg Oral QHS  . mirtazapine  7.5 mg Oral q morning - 10a  . pantoprazole  40 mg Oral q morning - 10a  . phosphorus  500 mg Oral BID WC  . rosuvastatin  40 mg Oral QPC supper  . sodium chloride flush  3 mL Intravenous Q12H   Continuous Infusions: . sodium chloride      Procedures/Studies: Dg Chest 2 View  Result Date: 08/10/2018 CLINICAL DATA:  Worsening dyspnea times 2-3 days EXAM: CHEST - 2 VIEW COMPARISON:  Chest CT 03/23/2018 and PET-CT 06/02/2017. FINDINGS: Emphysematous hyperinflation of the lungs consistent with COPD with subpleural areas of fibrosis at the lung bases bilaterally. Ovoid nodular 13 x 21 mm density projecting over the left upper lobe is again noted, previously noted to be FDG negative on PET CT and may reflect an area parenchymal consolidation or scarring. No effusion or pulmonary edema. No aggressive osseous lesions. 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 Redemonstration of ovoid nodular density in the left upper lobe noted to be FDG negative on prior PET-CT from 06/02/2017 Electronically Signed   By: Ashley Royalty M.D.   On: 08/10/2018 17:53   Ct Chest W Contrast  Result Date: 08/15/2018 CLINICAL DATA:  Coughing and chest tightness.   Shortness of breath. EXAM: CT CHEST WITH CONTRAST TECHNIQUE: Multidetector CT imaging of the chest was performed during intravenous contrast administration. CONTRAST:  66mL ISOVUE-300 IOPAMIDOL (ISOVUE-300) INJECTION 61% COMPARISON:  03/23/2018 CT scan FINDINGS: Cardiovascular: Coronary, aortic arch, and branch vessel atherosclerotic vascular disease. Suspected right coronary artery stent. Left anterior descending coronary artery atherosclerotic calcification. Mediastinum/Nodes: Subcarinal node 0.9 cm in short axis on image 76/4, roughly stable from 03/23/2018. Lungs/Pleura: Primarily in the left lower lobe but tracking along the minor fissure, a 3.0 by 2.8 cm mass is present on image 67/4 and appears to have mild heterogeneous enhancement. This abuts the posterior margin of the left pulmonary artery and also the margin of the descending thoracic aorta. This measured about 1.2 by 0.9 cm on 03/23/2018, and accordingly  unlike the other scattered pulmonary nodules this 1 has significantly progressed in a potentially aggressive manner. Other pulmonary nodules including the bandlike partially calcified left upper lobe nodule measuring 2.0 by 1.0 cm; the 0.8 by 0.6 cm lingular nodule on image 106/4; and the pleuroparenchymal nodularity along the lung apices is stable. Along the right lung base there is some slight increase in size and solidity of the irregular 1.6 by 1.7 cm nodule on image 150/4, previously this had a less solid appearance and was slightly smaller. There is also some mild architectural distortion posteriorly in the right lower lobe on image 124/4 which is minimally more prominent, but not in a measurable fashion. Severe emphysema. Peripheral fibrosis favoring the lung bases compatible with early honeycombing and end-stage lung disease. Upper Abdomen: Mild right adrenal nodularity is stable. Severe atrophy of the visualized part of the right kidney with some compensatory hypertrophy of the left.  Musculoskeletal: Unremarkable IMPRESSION: 1. Left lower lobe retrohilar mass highly suspicious for malignant lung cancer on image 67/2. The patient has had other pulmonary nodules which have been negative on PET-CT and which have not significantly progressed. However, this particular nodule has considerably increased in size compared to the 03/23/2018 exam and is highly suspicious for lung cancer. 2. There is also some increase in size and solidity of the right basilar nodule on image 150/4 compared to the prior exam and compared to the prior PET-CT. Multifocal lung cancer is a distinct possibility. 3. Other imaging findings of potential clinical significance: Aortic Atherosclerosis (ICD10-I70.0). Coronary atherosclerosis. Emphysema (ICD10-J43.9). Peripheral fibrosis in the lung bases with honeycombing. Atrophic right kidney. Electronically Signed   By: Van Clines M.D.   On: 08/15/2018 22:29    Orson Eva, DO  [facility] Pager [phone]  If 7PM-7AM, please contact night-coverage [email or web address] Password [email or web address] 08/16/2018, 12:11 PM   LOS: 4 days

## 2018-08-17 ENCOUNTER — Other Ambulatory Visit: Payer: Self-pay

## 2018-08-17 ENCOUNTER — Ambulatory Visit: Payer: Self-pay

## 2018-08-17 DIAGNOSIS — R918 Other nonspecific abnormal finding of lung field: Secondary | ICD-10-CM

## 2018-08-17 LAB — PHOSPHORUS: Phosphorus: 3.8 mg/dL (ref 2.5–4.6)

## 2018-08-17 LAB — GLUCOSE, CAPILLARY
GLUCOSE-CAPILLARY: 123 mg/dL — AB (ref 70–99)
GLUCOSE-CAPILLARY: 254 mg/dL — AB (ref 70–99)

## 2018-08-17 LAB — MAGNESIUM: Magnesium: 2 mg/dL (ref 1.7–2.4)

## 2018-08-17 LAB — BASIC METABOLIC PANEL
ANION GAP: 9 (ref 5–15)
BUN: 13 mg/dL (ref 8–23)
CHLORIDE: 89 mmol/L — AB (ref 98–111)
CO2: 36 mmol/L — ABNORMAL HIGH (ref 22–32)
Calcium: 7.8 mg/dL — ABNORMAL LOW (ref 8.9–10.3)
Creatinine, Ser: 0.61 mg/dL (ref 0.61–1.24)
GFR calc Af Amer: >60 mL/min (ref >60)
GLUCOSE: 131 mg/dL — AB (ref 70–99)
POTASSIUM: 3.1 mmol/L — AB (ref 3.5–5.1)
Sodium: 134 mmol/L — ABNORMAL LOW (ref 135–145)

## 2018-08-17 MED ORDER — LORAZEPAM 0.5 MG PO TABS: 0.5000 mg | ORAL_TABLET | Freq: Four times a day (QID) | ORAL | 0 refills | Status: AC | PRN

## 2018-08-17 MED ORDER — PREDNISONE 20 MG PO TABS: 60.0000 mg | ORAL_TABLET | Freq: Every day | ORAL | 0 refills | Status: DC

## 2018-08-17 MED ORDER — K PHOS MONO-SOD PHOS DI & MONO 155-852-130 MG PO TABS: 500.0000 mg | ORAL_TABLET | Freq: Two times a day (BID) | ORAL | 0 refills | Status: DC

## 2018-08-17 MED ORDER — PREDNISONE 20 MG PO TABS: 60.0000 mg | ORAL_TABLET | Freq: Every day | ORAL | Status: DC

## 2018-08-17 MED ORDER — PREDNISONE 10 MG PO TABS: 60.0000 mg | ORAL_TABLET | Freq: Every day | ORAL | 0 refills | Status: DC

## 2018-08-17 MED ORDER — MAGNESIUM OXIDE 400 (241.3 MG) MG PO TABS: 400.0000 mg | ORAL_TABLET | Freq: Two times a day (BID) | ORAL | 0 refills | Status: AC

## 2018-08-17 MED ORDER — POTASSIUM CHLORIDE CRYS ER 20 MEQ PO TBCR
40.0000 meq | EXTENDED_RELEASE_TABLET | Freq: Once | ORAL | Status: AC
  Administered 2018-08-17: 40 meq via ORAL
  Filled 2018-08-17: qty 2

## 2018-08-17 NOTE — Patient Outreach (Signed)
[facility]) Care Management  08/17/2018  Vincent Gilbert Nov 19, 1945 [id number]    RN Health Coach received notification that the patient was released from the hospital today for Acute on Chronic Respiratory Failure with Hypoxia.  Plan:  RN Health Coach will close the case at this time.  Send referral to [employer] Nurse for TOC.   Lazaro Arms RN, BSN, [place] Direct Dial:  [phone]  Fax: [phone]

## 2018-08-17 NOTE — Consult Note (Signed)
Consult requested by: [facility] hospitalist, Dr. Carles Collet Consult requested for: Abnormal chest CT  HPI: This is a 72 year old who has known severe COPD cardiac disease reflux previous stroke peripheral arterial disease and who came to the hospital with increasing shortness of breath.  At baseline he has chronic hypoxic respiratory failure and he is on 2 L of nasal cannula at home.  He says his breathing got worse.  As part of his work-up he ended up having a CT of the chest which shows a pulmonary lesion has essentially tripled in size since July.  This was evaluated with PET scanning in July and did not light up.  He has not had any weight loss.  He has some chest discomfort.  His breathing is improved.  No nausea vomiting diarrhea.  No headache.  No hemoptysis  Past Medical History:  Diagnosis Date  . Carotid artery occlusion   . COPD (chronic obstructive pulmonary disease) ([place])   . Coronary artery disease   . GERD (gastroesophageal reflux disease)   . Heart murmur   . Hypertension   . Myocardial infarction ([place]) 12/12  . Stroke [facility]) 09/10/2011     Family History  Problem Relation Age of Onset  . Heart disease Mother   . Heart disease Father      Social History   Socioeconomic History  . Marital status: Widowed    Spouse name: Not on file  . Number of children: Not on file  . Years of education: Not on file  . Highest education level: Not on file  Occupational History  . Not on file  Social Needs  . Financial resource strain: Not on file  . Food insecurity:    Worry: Not on file    Inability: Not on file  . Transportation needs:    Medical: Not on file    Non-medical: Not on file  Tobacco Use  . Smoking status: Former Smoker    Packs/day: 0.75    Years: 40.00    Pack years: 30.00    Types: Cigarettes    Start date: 02/26/1956  . Smokeless tobacco: Former Systems developer    Quit date: 03/21/2018  . Tobacco comment: 1/2 pk per day  Substance and Sexual Activity  . Alcohol use: Yes     Alcohol/week: 6.0 standard drinks    Types: 6 Cans of beer per week    Comment: daily  . Drug use: No  . Sexual activity: Not on file  Lifestyle  . Physical activity:    Days per week: Not on file    Minutes per session: Not on file  . Stress: Not on file  Relationships  . Social connections:    Talks on phone: Not on file    Gets together: Not on file    Attends religious service: Not on file    Active member of club or organization: Not on file    Attends meetings of clubs or organizations: Not on file    Relationship status: Not on file  Other Topics Concern  . Not on file  Social History Narrative  . Not on file     ROS: Except as mentioned 10 point review of systems is negative    Objective: Vital signs in last 24 hours: Temp:  [97.4 F (36.3 C)-98.4 F (36.9 C)] 98.1 F (36.7 C) (11/26 0521) Pulse Rate:  [76-77] 77 (11/26 0521) Resp:  [18] 18 (11/25 1407) BP: (112-160)/(64-72) 151/65 (11/26 0521) SpO2:  [90 %-100 %] 96 % (  11/26 0731) Weight change:  Last BM Date: 08/15/18  Intake/Output from previous day: 11/25 0701 - 11/26 0700 In: 766.7 [P.O.:720; IV Piggyback:46.7] Out: -   PHYSICAL EXAM Constitutional: He is awake and alert and in no acute distress.  He is thin.  Eyes: Previous cataract surgery.  EOMI.  Ears nose mouth and throat: His mucous membranes are slightly dry.  Hearing is grossly normal.  Cardiovascular: His heart is regular with normal heart sounds.  Respiratory: Respiratory effort is mildly increased and he has prolonged expiratory phase.  Gastrointestinal: His abdomen is soft with no masses.  Musculoskeletal: Grossly normal strength.  Neurological: No focal abnormalities.  Psychiatric: Normal mood and affect  Lab Results: Basic Metabolic Panel: Recent Labs    08/16/18 1038 08/17/18 0504  NA 132* 134*  K 3.0* 3.1*  CL 87* 89*  CO2 33* 36*  GLUCOSE 218* 131*  BUN 13 13  CREATININE 0.74 0.61  CALCIUM 8.1* 7.8*  MG 1.7 2.0  PHOS  4.3 3.8   Liver Function Tests: No results for input(s): AST, ALT, ALKPHOS, BILITOT, PROT, ALBUMIN in the last 72 hours. No results for input(s): LIPASE, AMYLASE in the last 72 hours. No results for input(s): AMMONIA in the last 72 hours. CBC: No results for input(s): WBC, NEUTROABS, HGB, HCT, MCV, PLT in the last 72 hours. Cardiac Enzymes: No results for input(s): CKTOTAL, CKMB, CKMBINDEX, TROPONINI in the last 72 hours. BNP: No results for input(s): PROBNP in the last 72 hours. D-Dimer: No results for input(s): DDIMER in the last 72 hours. CBG: Recent Labs    08/15/18 2310 08/16/18 0801 08/16/18 1141 08/16/18 1714 08/16/18 2110 08/17/18 0734  GLUCAP 184* 139* 185* 162* 209* 123*   Hemoglobin A1C: No results for input(s): HGBA1C in the last 72 hours. Fasting Lipid Panel: No results for input(s): CHOL, HDL, LDLCALC, TRIG, CHOLHDL, LDLDIRECT in the last 72 hours. Thyroid Function Tests: No results for input(s): TSH, T4TOTAL, FREET4, T3FREE, THYROIDAB in the last 72 hours. Anemia Panel: No results for input(s): VITAMINB12, FOLATE, FERRITIN, TIBC, IRON, RETICCTPCT in the last 72 hours. Coagulation: No results for input(s): LABPROT, INR in the last 72 hours. Urine Drug Screen: Drugs of Abuse  No results found for: LABOPIA, COCAINSCRNUR, LABBENZ, AMPHETMU, THCU, LABBARB  Alcohol Level: No results for input(s): ETH in the last 72 hours. Urinalysis: No results for input(s): COLORURINE, LABSPEC, PHURINE, GLUCOSEU, HGBUR, BILIRUBINUR, KETONESUR, PROTEINUR, UROBILINOGEN, NITRITE, LEUKOCYTESUR in the last 72 hours.  Invalid input(s): APPERANCEUR Misc. Labs:   ABGS: No results for input(s): PHART, PO2ART, TCO2, HCO3 in the last 72 hours.  Invalid input(s): PCO2   MICROBIOLOGY: Recent Results (from the past 240 hour(s))  Respiratory Panel by PCR     Status: None   Collection Time: 08/11/18  6:50 PM  Result Value Ref Range Status   Adenovirus NOT DETECTED NOT DETECTED  Final   Coronavirus 229E NOT DETECTED NOT DETECTED Final   Coronavirus HKU1 NOT DETECTED NOT DETECTED Final   Coronavirus NL63 NOT DETECTED NOT DETECTED Final   Coronavirus OC43 NOT DETECTED NOT DETECTED Final   Metapneumovirus NOT DETECTED NOT DETECTED Final   Rhinovirus / Enterovirus NOT DETECTED NOT DETECTED Final   Influenza A NOT DETECTED NOT DETECTED Final   Influenza B NOT DETECTED NOT DETECTED Final   Parainfluenza Virus 1 NOT DETECTED NOT DETECTED Final   Parainfluenza Virus 2 NOT DETECTED NOT DETECTED Final   Parainfluenza Virus 3 NOT DETECTED NOT DETECTED Final   Parainfluenza Virus 4 NOT  DETECTED NOT DETECTED Final   Respiratory Syncytial Virus NOT DETECTED NOT DETECTED Final   Bordetella pertussis NOT DETECTED NOT DETECTED Final   Chlamydophila pneumoniae NOT DETECTED NOT DETECTED Final   Mycoplasma pneumoniae NOT DETECTED NOT DETECTED Final    Comment: Performed at [facility], [place] [address] [place], [place] [address]    Studies/Results: Ct Chest W Contrast  Result Date: 08/15/2018 CLINICAL DATA:  Coughing and chest tightness.  Shortness of breath. EXAM: CT CHEST WITH CONTRAST TECHNIQUE: Multidetector CT imaging of the chest was performed during intravenous contrast administration. CONTRAST:  56mL ISOVUE-300 IOPAMIDOL (ISOVUE-300) INJECTION 61% COMPARISON:  03/23/2018 CT scan FINDINGS: Cardiovascular: Coronary, aortic arch, and branch vessel atherosclerotic vascular disease. Suspected right coronary artery stent. Left anterior descending coronary artery atherosclerotic calcification. Mediastinum/Nodes: Subcarinal node 0.9 cm in short axis on image 76/4, roughly stable from 03/23/2018. Lungs/Pleura: Primarily in the left lower lobe but tracking along the minor fissure, a 3.0 by 2.8 cm mass is present on image 67/4 and appears to have mild heterogeneous enhancement. This abuts the posterior margin of the left pulmonary artery and also the margin of the descending thoracic  aorta. This measured about 1.2 by 0.9 cm on 03/23/2018, and accordingly unlike the other scattered pulmonary nodules this 1 has significantly progressed in a potentially aggressive manner. Other pulmonary nodules including the bandlike partially calcified left upper lobe nodule measuring 2.0 by 1.0 cm; the 0.8 by 0.6 cm lingular nodule on image 106/4; and the pleuroparenchymal nodularity along the lung apices is stable. Along the right lung base there is some slight increase in size and solidity of the irregular 1.6 by 1.7 cm nodule on image 150/4, previously this had a less solid appearance and was slightly smaller. There is also some mild architectural distortion posteriorly in the right lower lobe on image 124/4 which is minimally more prominent, but not in a measurable fashion. Severe emphysema. Peripheral fibrosis favoring the lung bases compatible with early honeycombing and end-stage lung disease. Upper Abdomen: Mild right adrenal nodularity is stable. Severe atrophy of the visualized part of the right kidney with some compensatory hypertrophy of the left. Musculoskeletal: Unremarkable IMPRESSION: 1. Left lower lobe retrohilar mass highly suspicious for malignant lung cancer on image 67/2. The patient has had other pulmonary nodules which have been negative on PET-CT and which have not significantly progressed. However, this particular nodule has considerably increased in size compared to the 03/23/2018 exam and is highly suspicious for lung cancer. 2. There is also some increase in size and solidity of the right basilar nodule on image 150/4 compared to the prior exam and compared to the prior PET-CT. Multifocal lung cancer is a distinct possibility. 3. Other imaging findings of potential clinical significance: Aortic Atherosclerosis (ICD10-I70.0). Coronary atherosclerosis. Emphysema (ICD10-J43.9). Peripheral fibrosis in the lung bases with honeycombing. Atrophic right kidney. Electronically Signed   By:  Van Clines M.D.   On: 08/15/2018 22:29    Medications:  Prior to Admission:  Medications Prior to Admission  Medication Sig Dispense Refill Last Dose  . albuterol (PROVENTIL HFA;VENTOLIN HFA) 108 (90 Base) MCG/ACT inhaler Inhale 2 puffs into the lungs every 6 (six) hours as needed for wheezing or shortness of breath.   08/10/2018 at Unknown time  . amLODipine (NORVASC) 10 MG tablet Take 10 mg by mouth daily.   08/10/2018 at Unknown time  . aspirin EC 81 MG tablet Take 81 mg by mouth every morning.    08/10/2018 at Unknown time  .  budesonide-formoterol (SYMBICORT) 160-4.5 MCG/ACT inhaler Inhale 2 puffs into the lungs 2 (two) times daily.   08/10/2018 at Unknown time  . cetirizine (ZYRTEC) 10 MG tablet Take 10 mg by mouth daily.   08/10/2018 at Unknown time  . feeding supplement, ENSURE ENLIVE, (ENSURE ENLIVE) LIQD Take 237 mLs by mouth 2 (two) times daily between meals. 60 Bottle 0 08/10/2018 at Unknown time  . fluticasone (FLONASE) 50 MCG/ACT nasal spray Place 2 sprays into both nostrils daily.  4 08/10/2018 at Unknown time  . guaiFENesin (MUCINEX) 600 MG 12 hr tablet Take 600 mg by mouth 2 (two) times daily.    08/10/2018 at Unknown time  . ipratropium-albuterol (DUONEB) 0.5-2.5 (3) MG/3ML SOLN Take 3 mLs by nebulization every 6 (six) hours as needed (shortness of breath and wheeze). 360 mL 0 08/10/2018 at Unknown time  . lisinopril (PRINIVIL,ZESTRIL) 40 MG tablet Take 1 tablet (40 mg total) by mouth daily. 90 tablet 3 08/10/2018 at Unknown time  . magnesium oxide (MAG-OX) 400 (241.3 Mg) MG tablet Take 1 tablet (400 mg total) by mouth daily. (Patient taking differently: Take 500 mg by mouth daily. ) 15 tablet 0 08/10/2018 at Unknown time  . metoprolol (LOPRESSOR) 100 MG tablet TAKE 1 TABLET (100 MG TOTAL) BY MOUTH 2 (TWO) TIMES DAILY. (Patient taking differently: Take 50-100 mg by mouth See admin instructions. Take 150mg  every morning and 50mg  every night) 60 tablet 3 08/10/2018 at 1030am   . mirtazapine (REMERON) 7.5 MG tablet Take 7.5 mg by mouth every morning.    08/10/2018 at Unknown time  . Multiple Vitamin (MULTIVITAMIN) tablet Take 1 tablet by mouth daily.   08/10/2018 at Unknown time  . oral electrolytes (THERMOTABS) TABS tablet Take 1 tablet by mouth daily.   08/10/2018 at Unknown time  . pantoprazole (PROTONIX) 40 MG tablet Take 40 mg by mouth every morning.    08/10/2018 at Unknown time  . rosuvastatin (CRESTOR) 40 MG tablet Take 40 mg by mouth daily after supper.   08/09/2018 at Unknown time  . Tiotropium Bromide Monohydrate (SPIRIVA RESPIMAT) 2.5 MCG/ACT AERS Inhale 2 puffs into the lungs every morning.   08/10/2018 at Unknown time  . predniSONE (DELTASONE) 10 MG tablet Take 6 tablets (60 mg total) by mouth daily with breakfast. And decrease by one tablet daily (Patient not taking: Reported on 04/09/2018) 21 tablet 0 Not Taking at Unknown time  . tiotropium (SPIRIVA) 18 MCG inhalation capsule Place 18 mcg into inhaler and inhale daily.   Not Taking at Unknown time   Scheduled: . acetylcysteine  1 mL Nebulization BID  . amLODipine  10 mg Oral Daily  . arformoterol  15 mcg Nebulization BID  . aspirin EC  81 mg Oral q morning - 10a  . budesonide (PULMICORT) nebulizer solution  0.5 mg Nebulization BID  . enoxaparin (LOVENOX) injection  40 mg Subcutaneous Q24H  . feeding supplement (ENSURE ENLIVE)  237 mL Oral BID BM  . fluticasone  2 spray Each Nare Daily  . guaiFENesin  600 mg Oral BID  . insulin aspart  0-9 Units Subcutaneous TID WC  . ipratropium-albuterol  3 mL Nebulization Q6H  . lisinopril  40 mg Oral Daily  . loratadine  10 mg Oral Daily  . magnesium oxide  400 mg Oral BID  . mouth rinse  15 mL Mouth Rinse BID  . methylPREDNISolone (SOLU-MEDROL) injection  60 mg Intravenous Q6H  . metoprolol tartrate  150 mg Oral q morning - 10a  . metoprolol tartrate  50 mg Oral QHS  . mirtazapine  7.5 mg Oral q morning - 10a  . pantoprazole  40 mg Oral q morning - 10a  .  phosphorus  500 mg Oral BID WC  . rosuvastatin  40 mg Oral QPC supper  . sodium chloride flush  3 mL Intravenous Q12H   Continuous: . sodium chloride     BPZ:WCHENI chloride, iohexol, LORazepam, sodium chloride, sodium chloride flush  Assesment: He was admitted with COPD exacerbation and acute on chronic hypoxic respiratory failure.  He has abnormal CT with increase in size of a pulmonary mass.  This abuts great vessels and I do not think it can be safely biopsied transthoracically.  He also has honeycombing and it would be high risk for pneumothorax. Active Problems:   Alcohol abuse   Coronary artery disease   Protein-calorie malnutrition, severe   Acute on chronic respiratory failure with hypoxia ([facility])   COPD exacerbation ([facility])   COPD (chronic obstructive pulmonary disease) ([facility])   Goals of care, counseling/discussion    Plan: I think the most reasonable approach is to send him to the thoracic oncology clinic in [place].  It may be helpful to have another PET scan prior to that.  I think he will probably end up with radiation without a definitive diagnosis.  I think from a pulmonary point of view okay to discharge now    LOS: 5 days   Aamira Bischoff L 08/17/2018, 8:52 AM

## 2018-08-17 NOTE — Discharge Summary (Signed)
Physician Discharge Summary  Vincent Gilbert EXB:[id number] DOB: [birth year] DOA: 08/10/2018  PCP: Glenda Chroman, MD  Admit date: 08/10/2018 Discharge date: 08/17/2018  Admitted From: Home Disposition:  Home   Recommendations for Outpatient Follow-up:  1. Follow up with PCP in 1-2 weeks 2. Please obtain BMP/CBC in one week    Discharge Condition: Stable CODE STATUS: DNR Diet recommendation: Heart Healthy / Carb Modified   Brief/Interim Summary: 72 year old male with a history of hypertension, coronary artery disease with history of MI, stroke, chronic respiratory failure on 2 L nasal cannula, severe protein calorie malnutrition, alcohol dependence presenting with 1-1/2-week history of worsening shortness of breath. He states that he has had increasing coughing and some chest tightness associated with his coughing and shortness of breath. He denies any hemoptysis, fever, chills, nausea, vomiting, diarrhea. The patient was by himself, and he denies any sick contacts. He quit smoking approximately 4 to 5 months prior to this admission. He continues to drink a sixpack of beer daily. The patient was slow to improve. His therapy was gradually escalated to include Brovana, duo nebs, Solu-Medrol.  He subsequently improved after several days.  CT of the chest revealed size of right lower lobe nodule.  Pulmonary was consulted.  They felt, the patient could be discharged to have outpatient PET scan.  In addition, Dr. Luan Pulling will make referral for the patient to go to thoracic oncology clinic.  He did not feel that the patient was a good surgical candidate given his end-stage COPD.  Discharge Diagnoses:  Acute on chronic respiratory failure with hypoxia -Secondary to COPD exacerbation  -Continue weaning oxygen -normally on 2 L at home -presently stable on3L  -wean oxygen to keep sat 88-92% -Personally reviewed chest x-ray--hyperinflation without consolidation or edema -Personally  reviewed EKG--sinus rhythm, nonspecific T wave change -addedflutter valve -slow improvement; pt has very little reserve--remains dyspneic with minimal exertion -08/15/18--CT chest--3.-0 x 2.8 cm LLL mass abuts L-pulm artery; increase size in RLL nodule;  All concerning for malignancy -consult pulmonary for best approach going foward  COPD exacerbation -increasedPulmicortdose -Continue duo nebs -continue IV solumedrol>>>home with prednisone taper over 2 weeks -viral respiratory panel--neg -03/23/2018 CT chest--severe emphysematous change, decreased size ofLULovoid density, scattered bilateral subpleural interstitial changes and densities in the bilateral lower lobes with interseptal thickening. -continues to wheeze and remains dyspneic with minimal exertion -continuebrovana  Lung mass -as per CT above -pulmonary consulted-->out pt PET scan and referral to thoracic oncology clinic  Essential hypertension -Continue amlodipine and lisinopril and metoprolol tartrate -BP remains acceptable  Severe malnutrition -Continue Ensure  Hyperlipidemia -Continue statin  Alcohol abuse -No evidence of withdrawal  Hyperglycemia -Check hemoglobin A1c--5.5 -NovoLog sliding scale  Atypical chest pain -Cycle troponins--neg x 3 -EKG--personally reviewed--sinus rhythm nonspecific T wave change -check D-dimer--0.49 -echo--EF 60-65%, no WMA, G1DD, trivial MR  Hypomagnesemia/Hypophosphatemia/Hypokalemia -repleted -home with Kphos bid  Goals of Care -discussed with daughter and pt -changed to DNR Advance care planning, including the explanation and discussion of advance directives was carried out with the patient and family. Code status including explanations of "Full Code" and "DNR" and alternatives were discussed in detail. Discussion of end-of-life issues including but not limited palliative care, hospice care and the concept of hospice, other end-of-life care options, power of  attorney for health care decisions, living wills, and physician orders for life-sustaining treatment were also discussed with the patient and family. Total face to face time 16 minutes.   Discharge Instructions   Allergies as of 08/17/2018  Reactions   Oxycodone    nausea   Codeine Palpitations      Medication List    TAKE these medications   albuterol 108 (90 Base) MCG/ACT inhaler Commonly known as:  PROVENTIL HFA;VENTOLIN HFA Inhale 2 puffs into the lungs every 6 (six) hours as needed for wheezing or shortness of breath.   amLODipine 10 MG tablet Commonly known as:  NORVASC Take 10 mg by mouth daily.   aspirin EC 81 MG tablet Take 81 mg by mouth every morning.   budesonide-formoterol 160-4.5 MCG/ACT inhaler Commonly known as:  SYMBICORT Inhale 2 puffs into the lungs 2 (two) times daily.   cetirizine 10 MG tablet Commonly known as:  ZYRTEC Take 10 mg by mouth daily.   feeding supplement (ENSURE ENLIVE) Liqd Take 237 mLs by mouth 2 (two) times daily between meals.   fluticasone 50 MCG/ACT nasal spray Commonly known as:  FLONASE Place 2 sprays into both nostrils daily.   guaiFENesin 600 MG 12 hr tablet Commonly known as:  MUCINEX Take 600 mg by mouth 2 (two) times daily.   ipratropium-albuterol 0.5-2.5 (3) MG/3ML Soln Commonly known as:  DUONEB Take 3 mLs by nebulization every 6 (six) hours as needed (shortness of breath and wheeze).   lisinopril 40 MG tablet Commonly known as:  PRINIVIL,ZESTRIL Take 1 tablet (40 mg total) by mouth daily.   LORazepam 0.5 MG tablet Commonly known as:  ATIVAN Take 1 tablet (0.5 mg total) by mouth every 6 (six) hours as needed for anxiety.   magnesium oxide 400 (241.3 Mg) MG tablet Commonly known as:  MAG-OX Take 1 tablet (400 mg total) by mouth 2 (two) times daily. What changed:  when to take this   metoprolol tartrate 100 MG tablet Commonly known as:  LOPRESSOR TAKE 1 TABLET (100 MG TOTAL) BY MOUTH 2 (TWO) TIMES  DAILY. What changed:    how much to take  when to take this  additional instructions   mirtazapine 7.5 MG tablet Commonly known as:  REMERON Take 7.5 mg by mouth every morning.   multivitamin tablet Take 1 tablet by mouth daily.   oral electrolytes Tabs tablet Take 1 tablet by mouth daily.   pantoprazole 40 MG tablet Commonly known as:  PROTONIX Take 40 mg by mouth every morning.   phosphorus 155-852-130 MG tablet Commonly known as:  K PHOS NEUTRAL Take 2 tablets (500 mg total) by mouth 2 (two) times daily with a meal.   predniSONE 10 MG tablet Commonly known as:  DELTASONE Take 6 tablets (60 mg total) by mouth daily with breakfast. And decrease by one tablet every 2 days Start taking on:  08/18/2018 What changed:  additional instructions   rosuvastatin 40 MG tablet Commonly known as:  CRESTOR Take 40 mg by mouth daily after supper.   tiotropium 18 MCG inhalation capsule Commonly known as:  SPIRIVA Place 18 mcg into inhaler and inhale daily.   SPIRIVA RESPIMAT 2.5 MCG/ACT Aers Generic drug:  Tiotropium Bromide Monohydrate Inhale 2 puffs into the lungs every morning.      Follow-up Information    Schedule an appointment as soon as possible for a visit  with Glenda Chroman, MD.   Specialty:  Internal Medicine Contact information: [place] [address] [phone]          Allergies  Allergen Reactions  . Oxycodone     nausea  . Codeine Palpitations    Consultations:  pulmonary   Procedures/Studies: Dg Chest  2 View  Result Date: 08/10/2018 CLINICAL DATA:  Worsening dyspnea times 2-3 days EXAM: CHEST - 2 VIEW COMPARISON:  Chest CT 03/23/2018 and PET-CT 06/02/2017. FINDINGS: Emphysematous hyperinflation of the lungs consistent with COPD with subpleural areas of fibrosis at the lung bases bilaterally. Ovoid nodular 13 x 21 mm density projecting over the left upper lobe is again noted, previously noted to be FDG negative on PET CT and may  reflect an area parenchymal consolidation or scarring. No effusion or pulmonary edema. No aggressive osseous lesions. 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 Redemonstration of ovoid nodular density in the left upper lobe noted to be FDG negative on prior PET-CT from 06/02/2017 Electronically Signed   By: Ashley Royalty M.D.   On: 08/10/2018 17:53   Ct Chest W Contrast  Result Date: 08/15/2018 CLINICAL DATA:  Coughing and chest tightness.  Shortness of breath. EXAM: CT CHEST WITH CONTRAST TECHNIQUE: Multidetector CT imaging of the chest was performed during intravenous contrast administration. CONTRAST:  74mL ISOVUE-300 IOPAMIDOL (ISOVUE-300) INJECTION 61% COMPARISON:  03/23/2018 CT scan FINDINGS: Cardiovascular: Coronary, aortic arch, and branch vessel atherosclerotic vascular disease. Suspected right coronary artery stent. Left anterior descending coronary artery atherosclerotic calcification. Mediastinum/Nodes: Subcarinal node 0.9 cm in short axis on image 76/4, roughly stable from 03/23/2018. Lungs/Pleura: Primarily in the left lower lobe but tracking along the minor fissure, a 3.0 by 2.8 cm mass is present on image 67/4 and appears to have mild heterogeneous enhancement. This abuts the posterior margin of the left pulmonary artery and also the margin of the descending thoracic aorta. This measured about 1.2 by 0.9 cm on 03/23/2018, and accordingly unlike the other scattered pulmonary nodules this 1 has significantly progressed in a potentially aggressive manner. Other pulmonary nodules including the bandlike partially calcified left upper lobe nodule measuring 2.0 by 1.0 cm; the 0.8 by 0.6 cm lingular nodule on image 106/4; and the pleuroparenchymal nodularity along the lung apices is stable. Along the right lung base there is some slight increase in size and solidity of the irregular 1.6 by 1.7 cm nodule on image 150/4, previously this had a less solid appearance and was slightly smaller.  There is also some mild architectural distortion posteriorly in the right lower lobe on image 124/4 which is minimally more prominent, but not in a measurable fashion. Severe emphysema. Peripheral fibrosis favoring the lung bases compatible with early honeycombing and end-stage lung disease. Upper Abdomen: Mild right adrenal nodularity is stable. Severe atrophy of the visualized part of the right kidney with some compensatory hypertrophy of the left. Musculoskeletal: Unremarkable IMPRESSION: 1. Left lower lobe retrohilar mass highly suspicious for malignant lung cancer on image 67/2. The patient has had other pulmonary nodules which have been negative on PET-CT and which have not significantly progressed. However, this particular nodule has considerably increased in size compared to the 03/23/2018 exam and is highly suspicious for lung cancer. 2. There is also some increase in size and solidity of the right basilar nodule on image 150/4 compared to the prior exam and compared to the prior PET-CT. Multifocal lung cancer is a distinct possibility. 3. Other imaging findings of potential clinical significance: Aortic Atherosclerosis (ICD10-I70.0). Coronary atherosclerosis. Emphysema (ICD10-J43.9). Peripheral fibrosis in the lung bases with honeycombing. Atrophic right kidney. Electronically Signed   By: Van Clines M.D.   On: 08/15/2018 22:29        Discharge Exam: Vitals:   08/17/18 0521 08/17/18 0731  BP: (!) 151/65   Pulse: 77  Resp:    Temp: 98.1 F (36.7 C)   SpO2: 99% 96%   Vitals:   08/16/18 2110 08/17/18 0243 08/17/18 0521 08/17/18 0731  BP: (!) 160/72  (!) 151/65   Pulse: 76  77   Resp:      Temp: (!) 97.4 F (36.3 C)  98.1 F (36.7 C)   TempSrc: Oral  Oral   SpO2: 100% 98% 99% 96%  Weight:      Height:        General: Pt is alert, awake, not in acute distress Cardiovascular: RRR, S1/S2 +, no rubs, no gallops Respiratory: bibasilar rales. No wheeze. Diminished breath  sounds Abdominal: Soft, NT, ND, bowel sounds + Extremities: no edema, no cyanosis   The results of significant diagnostics from this hospitalization (including imaging, microbiology, ancillary and laboratory) are listed below for reference.    Significant Diagnostic Studies: Dg Chest 2 View  Result Date: 08/10/2018 CLINICAL DATA:  Worsening dyspnea times 2-3 days EXAM: CHEST - 2 VIEW COMPARISON:  Chest CT 03/23/2018 and PET-CT 06/02/2017. FINDINGS: Emphysematous hyperinflation of the lungs consistent with COPD with subpleural areas of fibrosis at the lung bases bilaterally. Ovoid nodular 13 x 21 mm density projecting over the left upper lobe is again noted, previously noted to be FDG negative on PET CT and may reflect an area parenchymal consolidation or scarring. No effusion or pulmonary edema. No aggressive osseous lesions. 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 Redemonstration of ovoid nodular density in the left upper lobe noted to be FDG negative on prior PET-CT from 06/02/2017 Electronically Signed   By: Ashley Royalty M.D.   On: 08/10/2018 17:53   Ct Chest W Contrast  Result Date: 08/15/2018 CLINICAL DATA:  Coughing and chest tightness.  Shortness of breath. EXAM: CT CHEST WITH CONTRAST TECHNIQUE: Multidetector CT imaging of the chest was performed during intravenous contrast administration. CONTRAST:  60mL ISOVUE-300 IOPAMIDOL (ISOVUE-300) INJECTION 61% COMPARISON:  03/23/2018 CT scan FINDINGS: Cardiovascular: Coronary, aortic arch, and branch vessel atherosclerotic vascular disease. Suspected right coronary artery stent. Left anterior descending coronary artery atherosclerotic calcification. Mediastinum/Nodes: Subcarinal node 0.9 cm in short axis on image 76/4, roughly stable from 03/23/2018. Lungs/Pleura: Primarily in the left lower lobe but tracking along the minor fissure, a 3.0 by 2.8 cm mass is present on image 67/4 and appears to have mild heterogeneous enhancement.  This abuts the posterior margin of the left pulmonary artery and also the margin of the descending thoracic aorta. This measured about 1.2 by 0.9 cm on 03/23/2018, and accordingly unlike the other scattered pulmonary nodules this 1 has significantly progressed in a potentially aggressive manner. Other pulmonary nodules including the bandlike partially calcified left upper lobe nodule measuring 2.0 by 1.0 cm; the 0.8 by 0.6 cm lingular nodule on image 106/4; and the pleuroparenchymal nodularity along the lung apices is stable. Along the right lung base there is some slight increase in size and solidity of the irregular 1.6 by 1.7 cm nodule on image 150/4, previously this had a less solid appearance and was slightly smaller. There is also some mild architectural distortion posteriorly in the right lower lobe on image 124/4 which is minimally more prominent, but not in a measurable fashion. Severe emphysema. Peripheral fibrosis favoring the lung bases compatible with early honeycombing and end-stage lung disease. Upper Abdomen: Mild right adrenal nodularity is stable. Severe atrophy of the visualized part of the right kidney with some compensatory hypertrophy of the left. Musculoskeletal: Unremarkable IMPRESSION: 1. Left lower  lobe retrohilar mass highly suspicious for malignant lung cancer on image 67/2. The patient has had other pulmonary nodules which have been negative on PET-CT and which have not significantly progressed. However, this particular nodule has considerably increased in size compared to the 03/23/2018 exam and is highly suspicious for lung cancer. 2. There is also some increase in size and solidity of the right basilar nodule on image 150/4 compared to the prior exam and compared to the prior PET-CT. Multifocal lung cancer is a distinct possibility. 3. Other imaging findings of potential clinical significance: Aortic Atherosclerosis (ICD10-I70.0). Coronary atherosclerosis. Emphysema (ICD10-J43.9).  Peripheral fibrosis in the lung bases with honeycombing. Atrophic right kidney. Electronically Signed   By: Van Clines M.D.   On: 08/15/2018 22:29     Microbiology: Recent Results (from the past 240 hour(s))  Respiratory Panel by PCR     Status: None   Collection Time: 08/11/18  6:50 PM  Result Value Ref Range Status   Adenovirus NOT DETECTED NOT DETECTED Final   Coronavirus 229E NOT DETECTED NOT DETECTED Final   Coronavirus HKU1 NOT DETECTED NOT DETECTED Final   Coronavirus NL63 NOT DETECTED NOT DETECTED Final   Coronavirus OC43 NOT DETECTED NOT DETECTED Final   Metapneumovirus NOT DETECTED NOT DETECTED Final   Rhinovirus / Enterovirus NOT DETECTED NOT DETECTED Final   Influenza A NOT DETECTED NOT DETECTED Final   Influenza B NOT DETECTED NOT DETECTED Final   Parainfluenza Virus 1 NOT DETECTED NOT DETECTED Final   Parainfluenza Virus 2 NOT DETECTED NOT DETECTED Final   Parainfluenza Virus 3 NOT DETECTED NOT DETECTED Final   Parainfluenza Virus 4 NOT DETECTED NOT DETECTED Final   Respiratory Syncytial Virus NOT DETECTED NOT DETECTED Final   Bordetella pertussis NOT DETECTED NOT DETECTED Final   Chlamydophila pneumoniae NOT DETECTED NOT DETECTED Final   Mycoplasma pneumoniae NOT DETECTED NOT DETECTED Final    Comment: Performed at [facility], [place]. [address] [place], [place] [address]     Labs: Basic Metabolic Panel: Recent Labs  Lab 08/13/18 0355 08/14/18 0612 08/15/18 0641 08/16/18 1038 08/17/18 0504  NA 134* 135 136 132* 134*  K 3.6 3.5 3.4* 3.0* 3.1*  CL 95* 92* 90* 87* 89*  CO2 31 34* 37* 33* 36*  GLUCOSE 205* 144* 132* 218* 131*  BUN 21 18 15 13 13   CREATININE 0.75 0.68 0.56* 0.74 0.61  CALCIUM 8.4* 8.5* 8.5* 8.1* 7.8*  MG 1.9 1.8 1.9 1.7 2.0  PHOS 1.9* 3.3 3.1 4.3 3.8   Liver Function Tests: Recent Labs  Lab 08/10/18 1747 08/11/18 0504  AST 25 19  ALT 13 12  ALKPHOS 117 96  BILITOT 1.0 0.7  PROT 7.9 6.7  ALBUMIN 3.8 3.3*   No results  for input(s): LIPASE, AMYLASE in the last 168 hours. No results for input(s): AMMONIA in the last 168 hours. CBC: Recent Labs  Lab 08/10/18 1747 08/11/18 0504  WBC 7.4 4.0  NEUTROABS 5.0  --   HGB 13.2 12.4*  HCT 39.9 37.5*  MCV 98.0 98.4  PLT 303 274   Cardiac Enzymes: Recent Labs  Lab 08/10/18 1747 08/12/18 1631 08/12/18 2141 08/13/18 0355  TROPONINI <0.03 <0.03 <0.03 <0.03   BNP: Invalid input(s): POCBNP CBG: Recent Labs  Lab 08/16/18 0801 08/16/18 1141 08/16/18 1714 08/16/18 2110 08/17/18 0734  GLUCAP 139* 185* 162* 209* 123*    Time coordinating discharge:  36 minutes  Signed:  Orson Eva, DO [facility] Pager: [phone] 08/17/2018, 10:48 AM

## 2018-08-17 NOTE — Progress Notes (Signed)
IV removed and discharge instructions reviewed. Daughter here and to drive home

## 2018-08-18 ENCOUNTER — Other Ambulatory Visit: Payer: Self-pay | Admitting: *Deleted

## 2018-08-18 NOTE — Patient Outreach (Signed)
Referral received from RN health coach, pt hospitalzed 11/19-11/26/19 for exacerbation COPD, pt has ongoing tobacco abuse, HTN, CAD, pt has lung mass and is now DNR, pt scheduled for PET scan 08/30/18.  Telephone call to pt for transition of care week 1, spoke with pt, HIPAA verified, pt states he has all medications and taking as prescribed, has assistance of family as needed, pt to see primary care MD 08/24/18 and will take medications with him.  Pt agreeable to weekly transition of care calls. Rn CM faxed barrier letter and today's note to primary MD Dr. Woody Seller.  Medications Reviewed Today    Reviewed by Kassie Mends, RN (Registered Nurse) on 08/18/18 at 37  Med List Status: <None>  Medication Order Taking? Sig Documenting Provider Last Dose Status Informant  albuterol (PROVENTIL HFA;VENTOLIN HFA) 108 (90 Base) MCG/ACT inhaler [id number] Yes Inhale 2 puffs into the lungs every 6 (six) hours as needed for wheezing or shortness of breath. [provider] Taking Active Self  amLODipine (NORVASC) 10 MG tablet [id number] Yes Take 10 mg by mouth daily. [provider] Taking Active Self  aspirin EC 81 MG tablet [id number] Yes Take 81 mg by mouth every morning.  [provider] Taking Active Self  budesonide-formoterol (SYMBICORT) 160-4.5 MCG/ACT inhaler [id number] Yes Inhale 2 puffs into the lungs 2 (two) times daily. [provider] Taking Active Self  cetirizine (ZYRTEC) 10 MG tablet [id number] Yes Take 10 mg by mouth daily. [provider] Taking Active Self  feeding supplement, ENSURE ENLIVE, (ENSURE ENLIVE) LIQD [id number] Yes Take 237 mLs by mouth 2 (two) times daily between meals. Orson Eva, MD Taking Active Self  fluticasone Asencion Islam) 50 MCG/ACT nasal spray [id number] Yes Place 2 sprays into both nostrils daily. [provider] Taking Active Self           Med Note Georgiann Cocker, HEATHER L   Mon Jan 01, 2015  9:19 AM)     guaiFENesin (MUCINEX) 600 MG 12  hr tablet [id number] Yes Take 600 mg by mouth 2 (two) times daily.  [provider] Taking Active Self  ipratropium-albuterol (DUONEB) 0.5-2.5 (3) MG/3ML SOLN [id number] Yes Take 3 mLs by nebulization every 6 (six) hours as needed (shortness of breath and wheeze). Orson Eva, MD Taking Active Self  lisinopril (PRINIVIL,ZESTRIL) 40 MG tablet [id number] Yes Take 1 tablet (40 mg total) by mouth daily. Arnoldo Lenis, MD Taking Active Self  LORazepam (ATIVAN) 0.5 MG tablet [id number] Yes Take 1 tablet (0.5 mg total) by mouth every 6 (six) hours as needed for anxiety. Orson Eva, MD Taking Active   magnesium oxide (MAG-OX) 400 (241.3 Mg) MG tablet [id number] Yes Take 1 tablet (400 mg total) by mouth 2 (two) times daily. Orson Eva, MD Taking Active   metoprolol (LOPRESSOR) 100 MG tablet [id number] Yes TAKE 1 TABLET (100 MG TOTAL) BY MOUTH 2 (TWO) TIMES DAILY.  Patient taking differently:  Take 50-100 mg by mouth See admin instructions. Take 150mg  every morning and 50mg  every night   Arnoldo Lenis, MD Taking Active Self  mirtazapine (REMERON) 7.5 MG tablet [id number] Yes Take 7.5 mg by mouth every morning.  [provider] Taking Active Self  Multiple Vitamin (MULTIVITAMIN) tablet [id number] Yes Take 1 tablet by mouth daily. [provider] Taking Active Self  oral electrolytes (THERMOTABS) TABS tablet [id number] Yes Take 1 tablet by mouth daily. [provider] Taking Active Self  pantoprazole (PROTONIX) 40 MG tablet [id number] Yes Take 40 mg by mouth every morning.  [provider] Taking Active Self  phosphorus (K PHOS NEUTRAL) [phone] MG tablet [id number] Yes Take 2 tablets (500 mg total) by mouth 2 (two) times daily with a meal. Tat, David, MD Taking Active   predniSONE (DELTASONE) 10 MG tablet [id number] Yes Take 6 tablets (60 mg total) by mouth daily with breakfast. And decrease by one tablet every 2 days Tat, Shanon Brow, MD Taking Active   rosuvastatin  (CRESTOR) 40 MG tablet [id number] Yes Take 40 mg by mouth daily after supper. [provider] Taking Active Self  tiotropium (SPIRIVA) 18 MCG inhalation capsule [id number] Yes Place 18 mcg into inhaler and inhale daily. [provider] Taking Active Self  Tiotropium Bromide Monohydrate (SPIRIVA RESPIMAT) 2.5 MCG/ACT AERS [id number]  Inhale 2 puffs into the lungs every morning. [provider]  Active Self          [facility] CM Care Plan Problem One     Most Recent Value  Care Plan Problem One  Knowledge deficit related to COPD  Role Documenting the Problem One  Care Management Coordinator  Care Plan for Problem One  Active  [facility] Long Term Goal   Pt will demonstrate improved self care within 60 days  [facility] Long Term Goal Start Date  08/18/18  Interventions for Problem One Long Term Goal  RN CM reviewed discharge instructions, reviewed medications over the phone, pt reports he has all inhalers and medication for nebulizer, RN CM confirmed pt has follow up appointment with primary MD.  [facility] CM Short Term Goal #1   pt will verbalize COPD action plan/ zones within 30 days  [facility] CM Short Term Goal #1 Start Date  08/18/18  Interventions for Short Term Goal #1  RN CM reviewed importance of knowing how you feel each day, following action plan  [facility] CM Short Term Goal #2   Alternation in nutrition- less than body requirements  [facility] CM Short Term Goal #2 Start Date  08/18/18  Interventions for Short Term Goal #2  Pt reports he is drinking nutritional supplement, Rn CM reviewed importance of continuing to do so and eating adequate protein, carbohydrates and calories.,  try small, frequent meals to avoid feeling of fullness      PLAN Continue weekly transition of care calls Follow up after pt sees primary MD on 08/24/18  Jacqlyn Larsen [facility], [place] Coordinator [phone]

## 2018-08-23 ENCOUNTER — Other Ambulatory Visit: Payer: Self-pay | Admitting: *Deleted

## 2018-08-23 NOTE — Patient Outreach (Signed)
Telephone call to pt for follow up on Red EMMi flags for sad, hopeless and not sure who to call for change in condition, spoke with pt, HIPAA verified, pt reports he does know who to call, his daughter, doctor or EMS.  Pt reports he is sad at times but no worse than usual and stems from COPD, pt states he gets anxious sometimes if he gets short of breath and takes ativan and reports " it does help me and also helps me sleep"  Pt to see primary MD tomorrow 08/24/18.  No further concerns verbalized.  PLAN Call pt end of week for transition of care.  Jacqlyn Larsen [facility], [place] Coordinator [phone]

## 2018-08-24 DIAGNOSIS — J449 Chronic obstructive pulmonary disease, unspecified: Secondary | ICD-10-CM | POA: Diagnosis not present

## 2018-08-24 DIAGNOSIS — Z299 Encounter for prophylactic measures, unspecified: Secondary | ICD-10-CM | POA: Diagnosis not present

## 2018-08-24 DIAGNOSIS — I10 Essential (primary) hypertension: Secondary | ICD-10-CM | POA: Diagnosis not present

## 2018-08-24 DIAGNOSIS — R600 Localized edema: Secondary | ICD-10-CM | POA: Diagnosis not present

## 2018-08-24 DIAGNOSIS — Z681 Body mass index (BMI) 19 or less, adult: Secondary | ICD-10-CM | POA: Diagnosis not present

## 2018-08-24 DIAGNOSIS — E876 Hypokalemia: Secondary | ICD-10-CM | POA: Diagnosis not present

## 2018-08-24 DIAGNOSIS — I2510 Atherosclerotic heart disease of native coronary artery without angina pectoris: Secondary | ICD-10-CM | POA: Diagnosis not present

## 2018-08-24 DIAGNOSIS — R918 Other nonspecific abnormal finding of lung field: Secondary | ICD-10-CM | POA: Diagnosis not present

## 2018-08-26 ENCOUNTER — Encounter: Payer: Self-pay | Admitting: *Deleted

## 2018-08-26 ENCOUNTER — Other Ambulatory Visit: Payer: Self-pay | Admitting: *Deleted

## 2018-08-26 ENCOUNTER — Other Ambulatory Visit (HOSPITAL_COMMUNITY): Payer: Self-pay | Admitting: Pulmonary Disease

## 2018-08-26 DIAGNOSIS — R918 Other nonspecific abnormal finding of lung field: Secondary | ICD-10-CM

## 2018-08-26 NOTE — Patient Outreach (Addendum)
Telephone call to pt for transition of care week 2, spoke with pt, HIPAA verified, pt reports he did see Dr. Woody Seller 08/24/18 and new medications prescribed, pt is taking ativan for occasional anxiety and states " it's helping",  Pt to have PET scan on 08/30/18 and to see Dr. Woody Seller on 10/02/18 for bloodwork, pt states he did receive flu vaccine 06/08/18, RN CM reviewed importance of staying away from sick people, good handwashing. Pt continues to be managed telephonically as pt preference. RN faxed today's visit note to primary MD Dr. Woody Seller.  THN CM Care Plan Problem One     Most Recent Value  Care Plan Problem One  Knowledge deficit related to COPD  Role Documenting the Problem One  Care Management Coordinator  Care Plan for Problem One  Active  THN Long Term Goal   Pt will demonstrate improved self care within 60 days  THN Long Term Goal Start Date  08/18/18  Interventions for Problem One Long Term Goal  RN CM reviewed with pt follow up visit with Dr. Woody Seller, pt states he had bloodwork done, has new medications torsemide 20mg  one day, K CL 40meq one day.  THN CM Short Term Goal #1   pt will verbalize COPD action plan/ zones within 30 days  THN CM Short Term Goal #1 Start Date  08/18/18  Interventions for Short Term Goal #1  RN CM reinforced COPD action plan/ zones with emphasis on yellow zone, importance of using inhalers and nebulizer as prescribed.  THN CM Short Term Goal #2   Alternation in nutrition- less than body requirements  THN CM Short Term Goal #2 Start Date  08/18/18  Interventions for Short Term Goal #2  RN CM reviewed importance of continuing to drink ensure, eating high calorie meals and 3 meals daily plus snacks as pt is not always doing this., pt states he has gained weight, 133 pounds at MD office.      PLAN Continue weekly transition of care calls  Jacqlyn Larsen [facility], [place] Coordinator [phone]

## 2018-08-30 ENCOUNTER — Emergency Department (HOSPITAL_COMMUNITY): Payer: Medicare Other

## 2018-08-30 ENCOUNTER — Encounter (HOSPITAL_COMMUNITY): Payer: Self-pay | Admitting: Emergency Medicine

## 2018-08-30 ENCOUNTER — Encounter (HOSPITAL_COMMUNITY): Payer: Medicare Other

## 2018-08-30 ENCOUNTER — Encounter (HOSPITAL_COMMUNITY): Payer: Self-pay

## 2018-08-30 ENCOUNTER — Other Ambulatory Visit: Payer: Self-pay

## 2018-08-30 ENCOUNTER — Inpatient Hospital Stay (HOSPITAL_COMMUNITY)
Admission: EM | Admit: 2018-08-30 | Discharge: 2018-09-03 | DRG: 190 | Disposition: A | Discharge status: Hospice | Payer: Medicare Other | Attending: Family Medicine | Admitting: Family Medicine

## 2018-08-30 DIAGNOSIS — J9621 Acute and chronic respiratory failure with hypoxia: Secondary | ICD-10-CM | POA: Diagnosis present

## 2018-08-30 DIAGNOSIS — Z7189 Other specified counseling: Secondary | ICD-10-CM

## 2018-08-30 DIAGNOSIS — T380X5A Adverse effect of glucocorticoids and synthetic analogues, initial encounter: Secondary | ICD-10-CM | POA: Diagnosis present

## 2018-08-30 DIAGNOSIS — J439 Emphysema, unspecified: Principal | ICD-10-CM | POA: Diagnosis present

## 2018-08-30 DIAGNOSIS — Z7952 Long term (current) use of systemic steroids: Secondary | ICD-10-CM

## 2018-08-30 DIAGNOSIS — I252 Old myocardial infarction: Secondary | ICD-10-CM

## 2018-08-30 DIAGNOSIS — R0789 Other chest pain: Secondary | ICD-10-CM | POA: Diagnosis present

## 2018-08-30 DIAGNOSIS — Z743 Need for continuous supervision: Secondary | ICD-10-CM | POA: Diagnosis not present

## 2018-08-30 DIAGNOSIS — Z8249 Family history of ischemic heart disease and other diseases of the circulatory system: Secondary | ICD-10-CM

## 2018-08-30 DIAGNOSIS — I5031 Acute diastolic (congestive) heart failure: Secondary | ICD-10-CM | POA: Diagnosis not present

## 2018-08-30 DIAGNOSIS — R739 Hyperglycemia, unspecified: Secondary | ICD-10-CM | POA: Diagnosis present

## 2018-08-30 DIAGNOSIS — I4581 Long QT syndrome: Secondary | ICD-10-CM | POA: Diagnosis not present

## 2018-08-30 DIAGNOSIS — I4891 Unspecified atrial fibrillation: Secondary | ICD-10-CM | POA: Diagnosis not present

## 2018-08-30 DIAGNOSIS — Z8673 Personal history of transient ischemic attack (TIA), and cerebral infarction without residual deficits: Secondary | ICD-10-CM

## 2018-08-30 DIAGNOSIS — C3492 Malignant neoplasm of unspecified part of left bronchus or lung: Secondary | ICD-10-CM | POA: Diagnosis present

## 2018-08-30 DIAGNOSIS — E876 Hypokalemia: Secondary | ICD-10-CM | POA: Diagnosis present

## 2018-08-30 DIAGNOSIS — F1010 Alcohol abuse, uncomplicated: Secondary | ICD-10-CM | POA: Diagnosis present

## 2018-08-30 DIAGNOSIS — R011 Cardiac murmur, unspecified: Secondary | ICD-10-CM | POA: Diagnosis present

## 2018-08-30 DIAGNOSIS — Z79899 Other long term (current) drug therapy: Secondary | ICD-10-CM

## 2018-08-30 DIAGNOSIS — R0602 Shortness of breath: Secondary | ICD-10-CM | POA: Diagnosis not present

## 2018-08-30 DIAGNOSIS — I2510 Atherosclerotic heart disease of native coronary artery without angina pectoris: Secondary | ICD-10-CM | POA: Diagnosis present

## 2018-08-30 DIAGNOSIS — Z87891 Personal history of nicotine dependence: Secondary | ICD-10-CM | POA: Diagnosis not present

## 2018-08-30 DIAGNOSIS — J441 Chronic obstructive pulmonary disease with (acute) exacerbation: Secondary | ICD-10-CM | POA: Diagnosis not present

## 2018-08-30 DIAGNOSIS — I110 Hypertensive heart disease with heart failure: Secondary | ICD-10-CM | POA: Diagnosis present

## 2018-08-30 DIAGNOSIS — Z681 Body mass index (BMI) 19 or less, adult: Secondary | ICD-10-CM

## 2018-08-30 DIAGNOSIS — I469 Cardiac arrest, cause unspecified: Secondary | ICD-10-CM | POA: Diagnosis not present

## 2018-08-30 DIAGNOSIS — I5032 Chronic diastolic (congestive) heart failure: Secondary | ICD-10-CM

## 2018-08-30 DIAGNOSIS — Z7982 Long term (current) use of aspirin: Secondary | ICD-10-CM

## 2018-08-30 DIAGNOSIS — Z955 Presence of coronary angioplasty implant and graft: Secondary | ICD-10-CM | POA: Diagnosis not present

## 2018-08-30 DIAGNOSIS — Y929 Unspecified place or not applicable: Secondary | ICD-10-CM

## 2018-08-30 DIAGNOSIS — E785 Hyperlipidemia, unspecified: Secondary | ICD-10-CM | POA: Diagnosis present

## 2018-08-30 DIAGNOSIS — E43 Unspecified severe protein-calorie malnutrition: Secondary | ICD-10-CM | POA: Diagnosis not present

## 2018-08-30 DIAGNOSIS — Z9852 Vasectomy status: Secondary | ICD-10-CM

## 2018-08-30 DIAGNOSIS — R0689 Other abnormalities of breathing: Secondary | ICD-10-CM | POA: Diagnosis not present

## 2018-08-30 DIAGNOSIS — Z885 Allergy status to narcotic agent status: Secondary | ICD-10-CM | POA: Diagnosis not present

## 2018-08-30 DIAGNOSIS — K219 Gastro-esophageal reflux disease without esophagitis: Secondary | ICD-10-CM | POA: Diagnosis present

## 2018-08-30 DIAGNOSIS — Z66 Do not resuscitate: Secondary | ICD-10-CM | POA: Diagnosis present

## 2018-08-30 DIAGNOSIS — I6529 Occlusion and stenosis of unspecified carotid artery: Secondary | ICD-10-CM | POA: Diagnosis present

## 2018-08-30 DIAGNOSIS — R000 Tachycardia, unspecified: Secondary | ICD-10-CM | POA: Diagnosis not present

## 2018-08-30 DIAGNOSIS — Z7951 Long term (current) use of inhaled steroids: Secondary | ICD-10-CM

## 2018-08-30 LAB — BASIC METABOLIC PANEL
ANION GAP: 16 — AB (ref 5–15)
BUN: 8 mg/dL (ref 8–23)
CALCIUM: 6.9 mg/dL — AB (ref 8.9–10.3)
CO2: 43 mmol/L — ABNORMAL HIGH (ref 22–32)
Chloride: 66 mmol/L — ABNORMAL LOW (ref 98–111)
Creatinine, Ser: 0.72 mg/dL (ref 0.61–1.24)
GFR calc Af Amer: >60 mL/min (ref >60)
GLUCOSE: 133 mg/dL — AB (ref 70–99)
Potassium: 2.6 mmol/L — CL (ref 3.5–5.1)
SODIUM: 125 mmol/L — AB (ref 135–145)

## 2018-08-30 LAB — CBC
HCT: 36.6 % — ABNORMAL LOW (ref 39.0–52.0)
Hemoglobin: 12.6 g/dL — ABNORMAL LOW (ref 13.0–17.0)
MCH: 33.2 pg (ref 26.0–34.0)
MCHC: 34.4 g/dL (ref 30.0–36.0)
MCV: 96.6 fL (ref 80.0–100.0)
PLATELETS: 234 10*3/uL (ref 150–400)
RBC: 3.79 MIL/uL — AB (ref 4.22–5.81)
RDW: 11.9 % (ref 11.5–15.5)
WBC: 9.9 10*3/uL (ref 4.0–10.5)
nRBC: 0 % (ref 0.0–0.2)

## 2018-08-30 LAB — BRAIN NATRIURETIC PEPTIDE: B Natriuretic Peptide: 163 pg/mL — ABNORMAL HIGH (ref 0.0–100.0)

## 2018-08-30 LAB — TROPONIN I
Troponin I: <0.03 ng/mL (ref <0.03)
Troponin I: <0.03 ng/mL (ref <0.03)

## 2018-08-30 MED ORDER — LORAZEPAM 0.5 MG PO TABS
0.5000 mg | ORAL_TABLET | Freq: Four times a day (QID) | ORAL | Status: DC | PRN
  Administered 2018-08-30 – 2018-09-03 (×10): 0.5 mg via ORAL
  Filled 2018-08-30 (×10): qty 1

## 2018-08-30 MED ORDER — IPRATROPIUM-ALBUTEROL 0.5-2.5 (3) MG/3ML IN SOLN: 3.0000 mL | RESPIRATORY_TRACT | Status: DC | PRN

## 2018-08-30 MED ORDER — ONDANSETRON HCL 4 MG PO TABS: 4.0000 mg | ORAL_TABLET | Freq: Four times a day (QID) | ORAL | Status: DC | PRN

## 2018-08-30 MED ORDER — MAGNESIUM SULFATE 2 GM/50ML IV SOLN
2.0000 g | Freq: Once | INTRAVENOUS | Status: AC
  Administered 2018-08-30: 2 g via INTRAVENOUS
  Filled 2018-08-30: qty 50

## 2018-08-30 MED ORDER — SODIUM CHLORIDE 0.9 % IV SOLN
INTRAVENOUS | Status: DC | PRN
  Administered 2018-08-30: 250 mL via INTRAVENOUS

## 2018-08-30 MED ORDER — METHYLPREDNISOLONE SODIUM SUCC 125 MG IJ SOLR
60.0000 mg | Freq: Four times a day (QID) | INTRAMUSCULAR | Status: DC
  Administered 2018-08-30 – 2018-09-02 (×12): 60 mg via INTRAVENOUS
  Filled 2018-08-30 (×12): qty 2

## 2018-08-30 MED ORDER — METHYLPREDNISOLONE SODIUM SUCC 125 MG IJ SOLR
125.0000 mg | Freq: Once | INTRAMUSCULAR | Status: AC
  Administered 2018-08-30: 125 mg via INTRAVENOUS
  Filled 2018-08-30: qty 2

## 2018-08-30 MED ORDER — ADULT MULTIVITAMIN W/MINERALS CH
ORAL_TABLET | Freq: Every day | ORAL | Status: DC
  Administered 2018-08-30: 15:00:00 via ORAL
  Administered 2018-08-31 – 2018-09-03 (×4): 1 via ORAL
  Filled 2018-08-30 (×4): qty 1

## 2018-08-30 MED ORDER — ENSURE ENLIVE PO LIQD
237.0000 mL | Freq: Two times a day (BID) | ORAL | Status: DC
  Administered 2018-08-30 – 2018-09-01 (×4): 237 mL via ORAL

## 2018-08-30 MED ORDER — METOPROLOL TARTRATE 50 MG PO TABS
150.0000 mg | ORAL_TABLET | Freq: Every day | ORAL | Status: DC
  Administered 2018-08-30 – 2018-08-31 (×2): 150 mg via ORAL
  Filled 2018-08-30 (×2): qty 3

## 2018-08-30 MED ORDER — ASPIRIN EC 81 MG PO TBEC
81.0000 mg | DELAYED_RELEASE_TABLET | Freq: Every morning | ORAL | Status: DC
  Administered 2018-08-30 – 2018-09-03 (×5): 81 mg via ORAL
  Filled 2018-08-30 (×5): qty 1

## 2018-08-30 MED ORDER — METOPROLOL TARTRATE 50 MG PO TABS
50.0000 mg | ORAL_TABLET | Freq: Every day | ORAL | Status: DC
  Administered 2018-08-30: 50 mg via ORAL
  Filled 2018-08-30: qty 1

## 2018-08-30 MED ORDER — FLUTICASONE PROPIONATE 50 MCG/ACT NA SUSP
2.0000 | Freq: Every day | NASAL | Status: DC
  Administered 2018-08-30 – 2018-09-03 (×5): 2 via NASAL
  Filled 2018-08-30 (×2): qty 16

## 2018-08-30 MED ORDER — POTASSIUM CHLORIDE 10 MEQ/100ML IV SOLN
INTRAVENOUS | Status: AC
  Filled 2018-08-30: qty 100

## 2018-08-30 MED ORDER — METOPROLOL TARTRATE 50 MG PO TABS: 50.0000 mg | ORAL_TABLET | ORAL | Status: DC

## 2018-08-30 MED ORDER — POTASSIUM CHLORIDE CRYS ER 20 MEQ PO TBCR
40.0000 meq | EXTENDED_RELEASE_TABLET | Freq: Once | ORAL | Status: DC
  Filled 2018-08-30: qty 2

## 2018-08-30 MED ORDER — ACETAMINOPHEN 650 MG RE SUPP: 650.0000 mg | Freq: Four times a day (QID) | RECTAL | Status: DC | PRN

## 2018-08-30 MED ORDER — ALBUTEROL SULFATE (2.5 MG/3ML) 0.083% IN NEBU
2.5000 mg | INHALATION_SOLUTION | Freq: Once | RESPIRATORY_TRACT | Status: AC
  Administered 2018-08-30: 2.5 mg via RESPIRATORY_TRACT
  Filled 2018-08-30: qty 3

## 2018-08-30 MED ORDER — MIRTAZAPINE 15 MG PO TABS
7.5000 mg | ORAL_TABLET | Freq: Every morning | ORAL | Status: DC
  Administered 2018-08-30 – 2018-09-03 (×5): 7.5 mg via ORAL
  Filled 2018-08-30 (×5): qty 1

## 2018-08-30 MED ORDER — LORATADINE 10 MG PO TABS
10.0000 mg | ORAL_TABLET | Freq: Every day | ORAL | Status: DC
  Administered 2018-08-30 – 2018-09-03 (×5): 10 mg via ORAL
  Filled 2018-08-30 (×5): qty 1

## 2018-08-30 MED ORDER — ROSUVASTATIN CALCIUM 20 MG PO TABS
40.0000 mg | ORAL_TABLET | Freq: Every day | ORAL | Status: DC
  Administered 2018-08-30 – 2018-09-02 (×4): 40 mg via ORAL
  Filled 2018-08-30 (×4): qty 2

## 2018-08-30 MED ORDER — FUROSEMIDE 10 MG/ML IJ SOLN
40.0000 mg | Freq: Once | INTRAMUSCULAR | Status: AC
  Administered 2018-08-30: 40 mg via INTRAVENOUS
  Filled 2018-08-30: qty 4

## 2018-08-30 MED ORDER — PANTOPRAZOLE SODIUM 40 MG PO TBEC
40.0000 mg | DELAYED_RELEASE_TABLET | Freq: Every morning | ORAL | Status: DC
  Administered 2018-08-30 – 2018-09-03 (×5): 40 mg via ORAL
  Filled 2018-08-30 (×5): qty 1

## 2018-08-30 MED ORDER — POTASSIUM CHLORIDE CRYS ER 20 MEQ PO TBCR
40.0000 meq | EXTENDED_RELEASE_TABLET | Freq: Once | ORAL | Status: AC
  Administered 2018-08-30: 40 meq via ORAL
  Filled 2018-08-30: qty 2

## 2018-08-30 MED ORDER — BUDESONIDE 0.5 MG/2ML IN SUSP
0.5000 mg | Freq: Two times a day (BID) | RESPIRATORY_TRACT | Status: DC
  Administered 2018-08-30 – 2018-09-03 (×7): 0.5 mg via RESPIRATORY_TRACT
  Filled 2018-08-30 (×8): qty 2

## 2018-08-30 MED ORDER — THERMOTABS PO TABS: 1.0000 | ORAL_TABLET | Freq: Every day | ORAL | Status: DC

## 2018-08-30 MED ORDER — K PHOS MONO-SOD PHOS DI & MONO 155-852-130 MG PO TABS
500.0000 mg | ORAL_TABLET | Freq: Two times a day (BID) | ORAL | Status: DC
  Administered 2018-08-30 – 2018-08-31 (×3): 500 mg via ORAL
  Filled 2018-08-30 (×4): qty 2

## 2018-08-30 MED ORDER — ENOXAPARIN SODIUM 40 MG/0.4ML ~~LOC~~ SOLN
40.0000 mg | SUBCUTANEOUS | Status: DC
  Administered 2018-08-30 – 2018-09-03 (×5): 40 mg via SUBCUTANEOUS
  Filled 2018-08-30 (×5): qty 0.4

## 2018-08-30 MED ORDER — MAGNESIUM OXIDE 400 (241.3 MG) MG PO TABS
400.0000 mg | ORAL_TABLET | Freq: Two times a day (BID) | ORAL | Status: DC
  Administered 2018-08-30 – 2018-09-03 (×9): 400 mg via ORAL
  Filled 2018-08-30 (×10): qty 1

## 2018-08-30 MED ORDER — GUAIFENESIN ER 600 MG PO TB12
600.0000 mg | ORAL_TABLET | Freq: Two times a day (BID) | ORAL | Status: DC
  Administered 2018-08-30 – 2018-09-03 (×9): 600 mg via ORAL
  Filled 2018-08-30 (×9): qty 1

## 2018-08-30 MED ORDER — IPRATROPIUM-ALBUTEROL 0.5-2.5 (3) MG/3ML IN SOLN
3.0000 mL | Freq: Once | RESPIRATORY_TRACT | Status: AC
  Administered 2018-08-30: 3 mL via RESPIRATORY_TRACT
  Filled 2018-08-30: qty 3

## 2018-08-30 MED ORDER — POTASSIUM CHLORIDE 10 MEQ/100ML IV SOLN
10.0000 meq | Freq: Once | INTRAVENOUS | Status: AC
  Administered 2018-08-30: 10 meq via INTRAVENOUS
  Filled 2018-08-30: qty 100

## 2018-08-30 MED ORDER — ONDANSETRON HCL 4 MG/2ML IJ SOLN: 4.0000 mg | Freq: Four times a day (QID) | INTRAMUSCULAR | Status: DC | PRN

## 2018-08-30 MED ORDER — ACETAMINOPHEN 325 MG PO TABS: 650.0000 mg | ORAL_TABLET | Freq: Four times a day (QID) | ORAL | Status: DC | PRN

## 2018-08-30 MED ORDER — IPRATROPIUM-ALBUTEROL 0.5-2.5 (3) MG/3ML IN SOLN
3.0000 mL | Freq: Four times a day (QID) | RESPIRATORY_TRACT | Status: DC
  Administered 2018-08-30 – 2018-09-03 (×17): 3 mL via RESPIRATORY_TRACT
  Filled 2018-08-30 (×17): qty 3

## 2018-08-30 NOTE — ED Provider Notes (Signed)
[facility] EMERGENCY DEPARTMENT Provider Note   CSN: [id number] Arrival date & time: 08/30/18  0706     History   Chief Complaint Chief Complaint  Patient presents with  . Shortness of Breath    HPI Vincent Gilbert is a 72 y.o. male.  Patient complains of shortness of breath.  Patient has severe COPD.  The history is provided by the patient. No language interpreter was used.  Shortness of Breath  This is a new problem. The problem occurs frequently.The current episode started 6 to 12 hours ago. The problem has not changed since onset.Associated symptoms include wheezing. Pertinent negatives include no fever, no headaches, no cough, no chest pain, no abdominal pain and no rash. Precipitated by: Unknown. Risk factors: Unknown. He has tried ipratropium inhalers for the symptoms. The treatment provided mild relief. He has had prior hospitalizations. Associated medical issues include COPD.    Past Medical History:  Diagnosis Date  . Carotid artery occlusion   . COPD (chronic obstructive pulmonary disease) ([place])   . Coronary artery disease   . GERD (gastroesophageal reflux disease)   . Heart murmur   . Hypertension   . Myocardial infarction ([place]) 12/12  . Stroke [facility] [place]) 09/10/2011    Patient Active Problem List   Diagnosis Date Noted  . Lung mass   . Goals of care, counseling/discussion 08/13/2018  . COPD (chronic obstructive pulmonary disease) ([place]) 08/12/2018  . COPD exacerbation ([place]) 08/10/2018  . Acute on chronic respiratory failure with hypoxia ([place]) 03/24/2018  . COPD with acute exacerbation ([place]) 03/24/2018  . Protein-calorie malnutrition, severe 03/22/2018  . CAP (community acquired pneumonia) 03/19/2018  . Occlusion and stenosis of carotid artery without mention of cerebral infarction 03/30/2012  . Occlusion and stenosis of carotid artery with cerebral infarction 03/08/2012  . Bruit 01/06/2012  . Coronary artery disease   . Hypertension 09/10/2011  . Tobacco  abuse 09/10/2011  . Alcohol abuse 09/10/2011  . GERD (gastroesophageal reflux disease) 09/10/2011  . Acute myocardial infarction of inferoposterior wall, initial episode of care [facility]) 09/10/2011    Past Surgical History:  Procedure Laterality Date  . CARDIAC CATHETERIZATION  09-15-2011   90% mid RCA stenosis. Normal EF  . CORONARY ANGIOPLASTY WITH STENT PLACEMENT  09-15-2011   mid RCA: 3.5 X32 mm Promus DES  . ENDARTERECTOMY  03/11/2012   Procedure: ENDARTERECTOMY CAROTID;  Surgeon: Mal Misty, MD;  Location: [place];  Service: Vascular;  Laterality: Right;  . EYE SURGERY  rt  . LEFT HEART CATHETERIZATION WITH CORONARY ANGIOGRAM N/A 09/15/2011   Procedure: LEFT HEART CATHETERIZATION WITH CORONARY ANGIOGRAM;  Surgeon: Sherren Mocha, MD;  Location: [facility] CATH LAB;  Service: Cardiovascular;  Laterality: N/A;  . PERCUTANEOUS CORONARY STENT INTERVENTION (PCI-S) N/A 09/15/2011   Procedure: PERCUTANEOUS CORONARY STENT INTERVENTION (PCI-S);  Surgeon: Sherren Mocha, MD;  Location: [facility] CATH LAB;  Service: Cardiovascular;  Laterality: N/A;  . VASECTOMY    . YAG LASER APPLICATION Right 1/57/2620   Procedure: YAG LASER APPLICATION;  Surgeon: Williams Che, MD;  Location: [facility];  Service: Ophthalmology;  Laterality: Right;        Home Medications    Prior to Admission medications   Medication Sig Start Date End Date Taking? Authorizing Provider  albuterol (PROVENTIL HFA;VENTOLIN HFA) 108 (90 Base) MCG/ACT inhaler Inhale 2 puffs into the lungs every 6 (six) hours as needed for wheezing or shortness of breath.    [provider]  amLODipine (NORVASC) 10 MG tablet Take 10 mg by  mouth daily.    [provider]  aspirin EC 81 MG tablet Take 81 mg by mouth every morning.     [provider]  budesonide-formoterol (SYMBICORT) 160-4.5 MCG/ACT inhaler Inhale 2 puffs into the lungs 2 (two) times daily.    [provider]  cetirizine (ZYRTEC) 10 MG tablet Take 10 mg  by mouth daily.    [provider]  feeding supplement, ENSURE ENLIVE, (ENSURE ENLIVE) LIQD Take 237 mLs by mouth 2 (two) times daily between meals. 03/26/18   Orson Eva, MD  fluticasone (FLONASE) 50 MCG/ACT nasal spray Place 2 sprays into both nostrils daily. 11/15/14   [provider]  guaiFENesin (MUCINEX) 600 MG 12 hr tablet Take 600 mg by mouth 2 (two) times daily.     [provider]  ipratropium-albuterol (DUONEB) 0.5-2.5 (3) MG/3ML SOLN Take 3 mLs by nebulization every 6 (six) hours as needed (shortness of breath and wheeze). 03/27/18   Orson Eva, MD  lisinopril (PRINIVIL,ZESTRIL) 40 MG tablet Take 1 tablet (40 mg total) by mouth daily. 05/04/15   Arnoldo Lenis, MD  LORazepam (ATIVAN) 0.5 MG tablet Take 1 tablet (0.5 mg total) by mouth every 6 (six) hours as needed for anxiety. 08/17/18   Orson Eva, MD  magnesium oxide (MAG-OX) 400 (241.3 Mg) MG tablet Take 1 tablet (400 mg total) by mouth 2 (two) times daily. 08/17/18   Orson Eva, MD  metoprolol (LOPRESSOR) 100 MG tablet TAKE 1 TABLET (100 MG TOTAL) BY MOUTH 2 (TWO) TIMES DAILY. Patient taking differently: Take 50-100 mg by mouth See admin instructions. Take 150mg  every morning and 50mg  every night 07/28/16   Arnoldo Lenis, MD  mirtazapine (REMERON) 7.5 MG tablet Take 7.5 mg by mouth every morning.  03/08/18   [provider]  Multiple Vitamin (MULTIVITAMIN) tablet Take 1 tablet by mouth daily.    [provider]  oral electrolytes (THERMOTABS) TABS tablet Take 1 tablet by mouth daily.    [provider]  pantoprazole (PROTONIX) 40 MG tablet Take 40 mg by mouth every morning.     [provider]  phosphorus (K PHOS NEUTRAL) [phone] MG tablet Take 2 tablets (500 mg total) by mouth 2 (two) times daily with a meal. 08/17/18   Tat, Shanon Brow, MD  potassium chloride (K-DUR) 10 MEQ tablet Take 10 mEq by mouth daily.    [provider]  predniSONE (DELTASONE) 10 MG tablet  Take 6 tablets (60 mg total) by mouth daily with breakfast. And decrease by one tablet every 2 days 08/18/18   Tat, Shanon Brow, MD  rosuvastatin (CRESTOR) 40 MG tablet Take 40 mg by mouth daily after supper.    [provider]  tiotropium (SPIRIVA) 18 MCG inhalation capsule Place 18 mcg into inhaler and inhale daily.    [provider]  Tiotropium Bromide Monohydrate (SPIRIVA RESPIMAT) 2.5 MCG/ACT AERS Inhale 2 puffs into the lungs every morning.    [provider]  torsemide (DEMADEX) 20 MG tablet Take 20 mg by mouth daily.    [provider]    Family History Family History  Problem Relation Age of Onset  . Heart disease Mother   . Heart disease Father     Social History Social History   Tobacco Use  . Smoking status: Former Smoker    Packs/day: 0.75    Years: 40.00    Pack years: 30.00    Types: Cigarettes    Start date: 02/26/1956  . Smokeless tobacco: Former Systems developer  Quit date: 03/21/2018  . Tobacco comment: 1/2 pk per day  Substance Use Topics  . Alcohol use: Yes    Alcohol/week: 6.0 standard drinks    Types: 6 Cans of beer per week    Comment: daily  . Drug use: No     Allergies   Oxycodone and Codeine   Review of Systems Review of Systems  Constitutional: Negative for appetite change, fatigue and fever.  HENT: Negative for congestion, ear discharge and sinus pressure.   Eyes: Negative for discharge.  Respiratory: Positive for shortness of breath and wheezing. Negative for cough.   Cardiovascular: Negative for chest pain.  Gastrointestinal: Negative for abdominal pain and diarrhea.  Genitourinary: Negative for frequency and hematuria.  Musculoskeletal: Negative for back pain.  Skin: Negative for rash.  Neurological: Negative for seizures and headaches.  Psychiatric/Behavioral: Negative for hallucinations.     Physical Exam Updated Vital Signs BP (!) 110/54   Pulse 88   Temp 98.6 F (37 C) (Oral)   Resp 14   Ht 5\' 9"   (1.753 m)   Wt 60.3 kg   SpO2 100%   BMI 19.64 kg/m   Physical Exam  Constitutional: He is oriented to person, place, and time. He appears well-developed.  HENT:  Head: Normocephalic.  Eyes: Conjunctivae and EOM are normal. No scleral icterus.  Neck: Neck supple. No thyromegaly present.  Cardiovascular: Normal rate and regular rhythm. Exam reveals no gallop and no friction rub.  No murmur heard. Pulmonary/Chest: No stridor. He has wheezes. He has no rales. He exhibits no tenderness.  Abdominal: He exhibits no distension. There is no tenderness. There is no rebound.  Musculoskeletal: Normal range of motion. He exhibits no edema.  Lymphadenopathy:    He has no cervical adenopathy.  Neurological: He is oriented to person, place, and time. He exhibits normal muscle tone. Coordination normal.  Skin: No rash noted. No erythema.  Psychiatric: He has a normal mood and affect. His behavior is normal.     ED Treatments / Results  Labs (all labs ordered are listed, but only abnormal results are displayed) Labs Reviewed  BASIC METABOLIC PANEL - Abnormal; Notable for the following components:      Result Value   Sodium 125 (*)    Potassium 2.6 (*)    Chloride 66 (*)    CO2 43 (*)    Glucose, Bld 133 (*)    Calcium 6.9 (*)    Anion gap 16 (*)    All other components within normal limits  CBC - Abnormal; Notable for the following components:   RBC 3.79 (*)    Hemoglobin 12.6 (*)    HCT 36.6 (*)    All other components within normal limits    EKG None  Radiology Dg Chest 2 View  Result Date: 08/30/2018 CLINICAL DATA:  Shortness of breath, noisy respiration. History of COPD, peripheral vascular disease, previous MI, former smoker. EXAM: CHEST - 2 VIEW COMPARISON:  Chest x-ray of August 10, 2018 and chest CT scan of August 15, 2018. FINDINGS: The lungs remain mildly hyperinflated. The interstitial markings remain increased. The heart and pulmonary vascularity are normal. Subtle  nodular density inferior to the anterior tip of the left second rib is again demonstrated. A previously described mass in the left lower lobe is not clearly visible. The bony thorax exhibits no acute abnormality. IMPRESSION: COPD. Chronic interstitial prominence may reflect fibrosis. No overt CHF or alveolar pneumonia. A known left lower lobe demonstrated on the August 15, 2018 chest CT scan mass is not clearly evident on this study. Electronically Signed   By: David  Martinique M.D.   On: 08/30/2018 08:31    Procedures Procedures (including critical care time)  Medications Ordered in ED Medications  0.9 %  sodium chloride infusion (250 mLs Intravenous New Bag/Given 08/30/18 0746)  potassium chloride SA (K-DUR,KLOR-CON) CR tablet 40 mEq (40 mEq Oral Not Given 08/30/18 1115)  methylPREDNISolone sodium succinate (SOLU-MEDROL) 125 mg/2 mL injection 125 mg (125 mg Intravenous Given 08/30/18 0745)  magnesium sulfate IVPB 2 g 50 mL (0 g Intravenous Stopped 08/30/18 0852)  ipratropium-albuterol (DUONEB) 0.5-2.5 (3) MG/3ML nebulizer solution 3 mL (3 mLs Nebulization Given 08/30/18 0819)  albuterol (PROVENTIL) (2.5 MG/3ML) 0.083% nebulizer solution 2.5 mg (2.5 mg Nebulization Given 08/30/18 0818)  potassium chloride SA (K-DUR,KLOR-CON) CR tablet 40 mEq (40 mEq Oral Given 08/30/18 0833)  potassium chloride 10 mEq in 100 mL IVPB (0 mEq Intravenous Stopped 08/30/18 1011)     Initial Impression / Assessment and Plan / ED Course  I have reviewed the triage vital signs and the nursing notes.  Pertinent labs & imaging results that were available during my care of the patient were reviewed by me and considered in my medical decision making (see chart for details).     CRITICAL CARE Performed by: Milton Ferguson Total critical care time 40 minutes Critical care time was exclusive of separately billable procedures and treating other patients. Critical care was necessary to treat or prevent imminent or  life-threatening deterioration. Critical care was time spent personally by me on the following activities: development of treatment plan with patient and/or surrogate as well as nursing, discussions with consultants, evaluation of patient's response to treatment, examination of patient, obtaining history from patient or surrogate, ordering and performing treatments and interventions, ordering and review of laboratory studies, ordering and review of radiographic studies, pulse oximetry and re-evaluation of patient's condition.  Patient with hypokalemia and COPD exacerbation Final Clinical Impressions(s) / ED Diagnoses   Final diagnoses:  COPD exacerbation [facility])    ED Discharge Orders    None       Milton Ferguson, MD 08/30/18 1124

## 2018-08-30 NOTE — Plan of Care (Signed)

## 2018-08-30 NOTE — H&P (Signed)
History and Physical  Vincent Gilbert WCB:[id number] DOB: Nov 18, 1945 DOA: 08/30/2018   PCP: Glenda Chroman, MD   Patient coming from: Home  Chief Complaint: dyspnea  HPI:  72 year old male with a history of hypertension, coronary artery disease with history of MI, stroke, chronic respiratory failure on 2 L nasal cannula, severe protein calorie malnutrition, alcohol dependence presenting with 3 day history of worsening shortness of breath. He states that he has had increasing coughing and some chest tightness associated with his coughing and shortness of breath.  The patient was recently discharged from the hospital after stay from 08/10/2017 through 08/17/2018 for acute on chronic respiratory failure secondary to COPD exacerbation. He denies any hemoptysis, fever, chills,  vomiting, diarrhea. The patient was by himself, and he denies any sick contacts. He quit smoking approximately 4 to 5 months prior to this admission. He continues to drink a sixpack of beer daily. The patient was slow to improve during his last admisstion and his therapy was gradually escalated to include Brovana, duo nebs, Solu-Medrol.  He subsequently improved after several days.  CT of the chest revealed size of right lower lobe nodule.  Pulmonary was consulted.  They felt, the patient could be discharged to have outpatient PET scan.  In addition, Dr. Luan Pulling will make referral for the patient to go to thoracic oncology clinic.  He did not feel that the patient was a good surgical candidate given his end-stage COPD.  In the emergency department, the patient was afebrile hemodynamically stable saturating 100% on 3 L.  However, patient continued to have wheezing and shortness of breath with minimal to no exertion.  BMP showed a sodium 125 and potassium 2.6.  WBC was 9.9.  EKG showed sinus rhythm with nonspecific T wave changes.  Chest x-ray showed chronic interstitial markings.  The patient was given Solu-Medrol 125 mg,  bronchodilators, and magnesium.  Assessment/Plan: Acute on chronic respiratory failure with hypoxia -Secondary to COPD exacerbation  -normally on 2 L at home -presently stable on3L  -wean oxygen to keep sat 88-92% -Personally reviewed chest x-ray--increased interstitial markings -Personally reviewed EKG--sinus rhythm, nonspecific T wave change -slow improvement; pt has very little reserve--remains dyspneic with minimal exertion -08/15/18--CT chest--3.-0 x 2.8 cm LLL mass abuts L-pulm artery; increase size in RLL nodule; All concerning for malignancy  COPD exacerbation -startPulmicort -Continue duo nebs -continue IV solumedrol -viral respiratory panel--neg 2 weeks ago -03/23/2018 CT chest--severe emphysematous change, decreased size ofLULovoid density, scattered bilateral subpleural interstitial changes and densities in the bilateral lower lobes with interseptal thickening. -continues to wheeze and remains dyspneic with minimal exertion -continuebrovana  Lung mass -as per CT above -pulmonary consulted-->out pt PET scan and referral to thoracic oncology clinic  Essential hypertension -Continue amlodipine and lisinopril and metoprolol tartrate -BP remains acceptable  Severe malnutrition -Continue Ensure  Hyperlipidemia -Continue statin  Alcohol abuse -No evidence of withdrawal  Hyperglycemia -Check hemoglobin A1c--5.5 -NovoLog sliding scale  Atypical chest pain -Cycle troponins -EKG--personally reviewed--sinus rhythm nonspecific T wave change -11/22-echo--EF 60-65%, no WMA, G1DD, trivial MR  Hypomagnesemia//Hypokalemia -repleted  Goals of Care -discussed with daughter and pt -changed to DNR Advance care planning, including the explanation and discussion of advance directives was carried out with the patient and family. Code status including explanations of "Full Code" and "DNR" and alternatives were discussed in detail. Discussion of end-of-life  issues including but not limited palliative care, hospice care and the concept of hospice, other end-of-life care options, power of attorney  for health care decisions, living wills, and physician orders for life-sustaining treatment were also discussed with the patient and family. Total face to face time 16 minutes.       Past Medical History:  Diagnosis Date  . Carotid artery occlusion   . COPD (chronic obstructive pulmonary disease) ([place])   . Coronary artery disease   . GERD (gastroesophageal reflux disease)   . Heart murmur   . Hypertension   . Myocardial infarction ([place]) 12/12  . Stroke [facility] 09/10/2011   Past Surgical History:  Procedure Laterality Date  . CARDIAC CATHETERIZATION  09-15-2011   90% mid RCA stenosis. Normal EF  . CORONARY ANGIOPLASTY WITH STENT PLACEMENT  09-15-2011   mid RCA: 3.5 X32 mm Promus DES  . ENDARTERECTOMY  03/11/2012   Procedure: ENDARTERECTOMY CAROTID;  Surgeon: Mal Misty, MD;  Location: [place];  Service: Vascular;  Laterality: Right;  . EYE SURGERY  rt  . LEFT HEART CATHETERIZATION WITH CORONARY ANGIOGRAM N/A 09/15/2011   Procedure: LEFT HEART CATHETERIZATION WITH CORONARY ANGIOGRAM;  Surgeon: Sherren Mocha, MD;  Location: [facility] CATH LAB;  Service: Cardiovascular;  Laterality: N/A;  . PERCUTANEOUS CORONARY STENT INTERVENTION (PCI-S) N/A 09/15/2011   Procedure: PERCUTANEOUS CORONARY STENT INTERVENTION (PCI-S);  Surgeon: Sherren Mocha, MD;  Location: [facility] CATH LAB;  Service: Cardiovascular;  Laterality: N/A;  . VASECTOMY    . YAG LASER APPLICATION Right 04/16/3663   Procedure: YAG LASER APPLICATION;  Surgeon: Williams Che, MD;  Location: [facility] ORS;  Service: Ophthalmology;  Laterality: Right;   Social History:  reports that he has quit smoking. His smoking use included cigarettes. He started smoking about 70 years ago. He has a 30.00 pack-year smoking history. He quit smokeless tobacco use about 5 months ago. He reports that he drinks about 6.0  standard drinks of alcohol per week. He reports that he does not use drugs.   Family History  Problem Relation Age of Onset  . Heart disease Mother   . Heart disease Father      Allergies  Allergen Reactions  . Oxycodone     nausea  . Codeine Palpitations     Prior to Admission medications   Medication Sig Start Date End Date Taking? Authorizing Provider  albuterol (PROVENTIL HFA;VENTOLIN HFA) 108 (90 Base) MCG/ACT inhaler Inhale 2 puffs into the lungs every 6 (six) hours as needed for wheezing or shortness of breath.    [provider]  amLODipine (NORVASC) 10 MG tablet Take 10 mg by mouth daily.    [provider]  aspirin EC 81 MG tablet Take 81 mg by mouth every morning.     [provider]  budesonide-formoterol (SYMBICORT) 160-4.5 MCG/ACT inhaler Inhale 2 puffs into the lungs 2 (two) times daily.    [provider]  cetirizine (ZYRTEC) 10 MG tablet Take 10 mg by mouth daily.    [provider]  feeding supplement, ENSURE ENLIVE, (ENSURE ENLIVE) LIQD Take 237 mLs by mouth 2 (two) times daily between meals. 03/26/18   Orson Eva, MD  fluticasone (FLONASE) 50 MCG/ACT nasal spray Place 2 sprays into both nostrils daily. 11/15/14   [provider]  guaiFENesin (MUCINEX) 600 MG 12 hr tablet Take 600 mg by mouth 2 (two) times daily.     [provider]  ipratropium-albuterol (DUONEB) 0.5-2.5 (3) MG/3ML SOLN Take 3 mLs by nebulization every 6 (six) hours as needed (shortness of breath and wheeze). 03/27/18   Orson Eva, MD  lisinopril (PRINIVIL,ZESTRIL) 40 MG tablet  Take 1 tablet (40 mg total) by mouth daily. 05/04/15   Arnoldo Lenis, MD  LORazepam (ATIVAN) 0.5 MG tablet Take 1 tablet (0.5 mg total) by mouth every 6 (six) hours as needed for anxiety. 08/17/18   Orson Eva, MD  magnesium oxide (MAG-OX) 400 (241.3 Mg) MG tablet Take 1 tablet (400 mg total) by mouth 2 (two) times daily. 08/17/18   Orson Eva, MD  metoprolol  (LOPRESSOR) 100 MG tablet TAKE 1 TABLET (100 MG TOTAL) BY MOUTH 2 (TWO) TIMES DAILY. Patient taking differently: Take 50-100 mg by mouth See admin instructions. Take 150mg  every morning and 50mg  every night 07/28/16   Arnoldo Lenis, MD  mirtazapine (REMERON) 7.5 MG tablet Take 7.5 mg by mouth every morning.  03/08/18   [provider]  Multiple Vitamin (MULTIVITAMIN) tablet Take 1 tablet by mouth daily.    [provider]  oral electrolytes (THERMOTABS) TABS tablet Take 1 tablet by mouth daily.    [provider]  pantoprazole (PROTONIX) 40 MG tablet Take 40 mg by mouth every morning.     [provider]  phosphorus (K PHOS NEUTRAL) 161-096-045 MG tablet Take 2 tablets (500 mg total) by mouth 2 (two) times daily with a meal. 08/17/18   Licet Dunphy, Shanon Brow, MD  potassium chloride (K-DUR) 10 MEQ tablet Take 10 mEq by mouth daily.    [provider]  predniSONE (DELTASONE) 10 MG tablet Take 6 tablets (60 mg total) by mouth daily with breakfast. And decrease by one tablet every 2 days 08/18/18   Keziah Drotar, Shanon Brow, MD  rosuvastatin (CRESTOR) 40 MG tablet Take 40 mg by mouth daily after supper.    [provider]  tiotropium (SPIRIVA) 18 MCG inhalation capsule Place 18 mcg into inhaler and inhale daily.    [provider]  Tiotropium Bromide Monohydrate (SPIRIVA RESPIMAT) 2.5 MCG/ACT AERS Inhale 2 puffs into the lungs every morning.    [provider]  torsemide (DEMADEX) 20 MG tablet Take 20 mg by mouth daily.    [provider]    Review of Systems:  Constitutional:  No weight loss, night sweats, Fevers, chills, fatigue.  Head&Eyes: No headache.  No vision loss.  No eye pain or scotoma ENT:  No Difficulty swallowing,Tooth/dental problems,Sore throat,  No ear ache, post nasal drip,  Cardio-vascular:  No Orthopnea, PND, swelling in lower extremities,  dizziness, palpitations  GI:  No  abdominal pain, nausea, vomiting, diarrhea,  loss of appetite, hematochezia, melena, heartburn, indigestion, Resp:  No coughing up of blood .No wheezing.No chest wall deformity  Skin:  no rash or lesions.  GU:  no dysuria, change in color of urine, no urgency or frequency. No flank pain.  Musculoskeletal:  No joint pain or swelling. No decreased range of motion. No back pain.  Psych:  No change in mood or affect. No depression or anxiety. Neurologic: No headache, no dysesthesia, no focal weakness, no vision loss. No syncope  Physical Exam: Vitals:   08/30/18 0900 08/30/18 0930 08/30/18 1000 08/30/18 1030  BP: 118/66 131/69 108/61 (!) 110/54  Pulse: 99 93 90 88  Resp: (!) 21 17 18 14   Temp:      TempSrc:      SpO2: 99% 97% 98% 100%  Weight:      Height:       General:  A&O x 3, NAD, nontoxic, pleasant/cooperative Head/Eye: No conjunctival hemorrhage, no icterus, [place]/AT, No nystagmus ENT:  No icterus,  No thrush, good dentition, no pharyngeal  exudate Neck:  No masses, no lymphadenpathy, no bruits CV:  RRR, no rub, no gallop, no S3 Lung:  Bilateral rales, bilateral expiratory wheeze Abdomen: soft/NT, +BS, nondistended, no peritoneal signs Ext: No cyanosis, No rashes, No petechiae, No lymphangitis, 1 +LE edema Neuro: CNII-XII intact, strength 4/5 in bilateral upper and lower extremities, no dysmetria  Labs on Admission:  Basic Metabolic Panel: Recent Labs  Lab 08/30/18 0748  NA 125*  K 2.6*  CL 66*  CO2 43*  GLUCOSE 133*  BUN 8  CREATININE 0.72  CALCIUM 6.9*   Liver Function Tests: No results for input(s): AST, ALT, ALKPHOS, BILITOT, PROT, ALBUMIN in the last 168 hours. No results for input(s): LIPASE, AMYLASE in the last 168 hours. No results for input(s): AMMONIA in the last 168 hours. CBC: Recent Labs  Lab 08/30/18 0748  WBC 9.9  HGB 12.6*  HCT 36.6*  MCV 96.6  PLT 234   Coagulation Profile: No results for input(s): INR, PROTIME in the last 168 hours. Cardiac Enzymes: No results for input(s):  CKTOTAL, CKMB, CKMBINDEX, TROPONINI in the last 168 hours. BNP: Invalid input(s): POCBNP CBG: No results for input(s): GLUCAP in the last 168 hours. Urine analysis:    Component Value Date/Time   COLORURINE YELLOW 08/11/2018 0944   APPEARANCEUR HAZY (A) 08/11/2018 0944   LABSPEC 1.012 08/11/2018 0944   PHURINE 8.0 08/11/2018 [place] 08/11/2018 0944   HGBUR NEGATIVE 08/11/2018 0944   BILIRUBINUR NEGATIVE 08/11/2018 0944   KETONESUR NEGATIVE 08/11/2018 0944   PROTEINUR NEGATIVE 08/11/2018 0944   UROBILINOGEN 1.0 03/10/2012 1312   NITRITE NEGATIVE 08/11/2018 0944   LEUKOCYTESUR NEGATIVE 08/11/2018 0944   Sepsis Labs: @LABRCNTIP (procalcitonin:4,lacticidven:4) )No results found for this or any previous visit (from the past 240 hour(s)).   Radiological Exams on Admission: Dg Chest 2 View  Result Date: 08/30/2018 CLINICAL DATA:  Shortness of breath, noisy respiration. History of COPD, peripheral vascular disease, previous MI, former smoker. EXAM: CHEST - 2 VIEW COMPARISON:  Chest x-ray of August 10, 2018 and chest CT scan of August 15, 2018. FINDINGS: The lungs remain mildly hyperinflated. The interstitial markings remain increased. The heart and pulmonary vascularity are normal. Subtle nodular density inferior to the anterior tip of the left second rib is again demonstrated. A previously described mass in the left lower lobe is not clearly visible. The bony thorax exhibits no acute abnormality. IMPRESSION: COPD. Chronic interstitial prominence may reflect fibrosis. No overt CHF or alveolar pneumonia. A known left lower lobe demonstrated on the August 15, 2018 chest CT scan mass is not clearly evident on this study. Electronically Signed   By: Shaquita [place]  [place] M.D.   On: 08/30/2018 08:31    EKG: Independently reviewed. Sinus, nonspecific T wave changes    Time spent:60 minutes Code Status:   DNR Family Communication:  Daughter updated at bedside 12/9 Disposition  Plan: expect 2-3 day hospitalization Consults called: none DVT Prophylaxis: [place] Lovenox  Orson Eva, DO  [facility] Pager [phone]  If 7PM-7AM, please contact night-coverage [email or web address] Password [email or web address] 08/30/2018, 11:47 AM

## 2018-08-30 NOTE — ED Triage Notes (Signed)
Per EMS pt from home. Patient called out due to shortness of breath with history of COPD. Patient received 3 albuterol and 1 Atrovent nebulizer and Solumedrol 125 mg IV from EMS.

## 2018-08-30 NOTE — ED Notes (Signed)
CRITICAL VALUE ALERT  Critical Value:  K+ 2.6  Date & Time Notied:  08/30/18  Provider Notified: Dr Roderic Palau  Orders Received/Actions taken: see new orders.

## 2018-08-31 ENCOUNTER — Encounter: Payer: Medicare Other | Admitting: Cardiothoracic Surgery

## 2018-08-31 DIAGNOSIS — I5032 Chronic diastolic (congestive) heart failure: Secondary | ICD-10-CM

## 2018-08-31 LAB — CBC
HCT: 30.3 % — ABNORMAL LOW (ref 39.0–52.0)
HEMOGLOBIN: 10.5 g/dL — AB (ref 13.0–17.0)
MCH: 32.9 pg (ref 26.0–34.0)
MCHC: 34.7 g/dL (ref 30.0–36.0)
MCV: 95 fL (ref 80.0–100.0)
Platelets: 196 10*3/uL (ref 150–400)
RBC: 3.19 MIL/uL — ABNORMAL LOW (ref 4.22–5.81)
RDW: 11.9 % (ref 11.5–15.5)
WBC: 7.9 10*3/uL (ref 4.0–10.5)
nRBC: 0 % (ref 0.0–0.2)

## 2018-08-31 LAB — MAGNESIUM: Magnesium: 1.4 mg/dL — ABNORMAL LOW (ref 1.7–2.4)

## 2018-08-31 LAB — BASIC METABOLIC PANEL
Anion gap: 12 (ref 5–15)
BUN: 10 mg/dL (ref 8–23)
CO2: 45 mmol/L — ABNORMAL HIGH (ref 22–32)
Calcium: 6.7 mg/dL — ABNORMAL LOW (ref 8.9–10.3)
Chloride: 72 mmol/L — ABNORMAL LOW (ref 98–111)
Creatinine, Ser: 0.65 mg/dL (ref 0.61–1.24)
GFR calc Af Amer: >60 mL/min (ref >60)
GFR calc non Af Amer: >60 mL/min (ref >60)
Glucose, Bld: 194 mg/dL — ABNORMAL HIGH (ref 70–99)
Potassium: 2.5 mmol/L — CL (ref 3.5–5.1)
SODIUM: 129 mmol/L — AB (ref 135–145)

## 2018-08-31 LAB — TROPONIN I: Troponin I: <0.03 ng/mL (ref <0.03)

## 2018-08-31 MED ORDER — FUROSEMIDE 20 MG PO TABS
20.0000 mg | ORAL_TABLET | Freq: Every day | ORAL | Status: DC
  Administered 2018-09-01 – 2018-09-03 (×3): 20 mg via ORAL
  Filled 2018-08-31 (×3): qty 1

## 2018-08-31 MED ORDER — FUROSEMIDE 10 MG/ML IJ SOLN
20.0000 mg | Freq: Once | INTRAMUSCULAR | Status: AC
  Administered 2018-08-31: 20 mg via INTRAVENOUS
  Filled 2018-08-31: qty 2

## 2018-08-31 MED ORDER — POTASSIUM CHLORIDE CRYS ER 20 MEQ PO TBCR
40.0000 meq | EXTENDED_RELEASE_TABLET | Freq: Once | ORAL | Status: DC
  Filled 2018-08-31 (×2): qty 2

## 2018-08-31 MED ORDER — MAGNESIUM SULFATE IN D5W 1-5 GM/100ML-% IV SOLN
1.0000 g | Freq: Once | INTRAVENOUS | Status: DC
  Filled 2018-08-31: qty 100

## 2018-08-31 MED ORDER — MAGNESIUM SULFATE 2 GM/50ML IV SOLN: 2.0000 g | Freq: Once | INTRAVENOUS | Status: DC

## 2018-08-31 MED ORDER — MAGNESIUM SULFATE IN D5W 1-5 GM/100ML-% IV SOLN
1.0000 g | Freq: Once | INTRAVENOUS | Status: AC
  Administered 2018-08-31: 1 g via INTRAVENOUS
  Filled 2018-08-31: qty 100

## 2018-08-31 MED ORDER — POTASSIUM CHLORIDE CRYS ER 20 MEQ PO TBCR
20.0000 meq | EXTENDED_RELEASE_TABLET | Freq: Once | ORAL | Status: AC
  Administered 2018-08-31: 20 meq via ORAL
  Filled 2018-08-31: qty 1

## 2018-08-31 MED ORDER — MAGNESIUM SULFATE IN D5W 1-5 GM/100ML-% IV SOLN
1.0000 g | Freq: Once | INTRAVENOUS | Status: DC
  Administered 2018-08-31: 1 g via INTRAVENOUS
  Filled 2018-08-31: qty 100

## 2018-08-31 MED ORDER — METOPROLOL TARTRATE 50 MG PO TABS
50.0000 mg | ORAL_TABLET | Freq: Two times a day (BID) | ORAL | Status: DC
  Administered 2018-08-31 – 2018-09-03 (×6): 50 mg via ORAL
  Filled 2018-08-31 (×6): qty 1

## 2018-08-31 MED ORDER — POTASSIUM CHLORIDE CRYS ER 20 MEQ PO TBCR
40.0000 meq | EXTENDED_RELEASE_TABLET | Freq: Once | ORAL | Status: AC
  Administered 2018-08-31: 40 meq via ORAL
  Filled 2018-08-31: qty 2

## 2018-08-31 NOTE — Progress Notes (Signed)
PROGRESS NOTE  Vincent Gilbert YPP:[id number] DOB: 08-02-1946 DOA: 08/30/2018 PCP: Glenda Chroman, MD  Brief History:  72 year old male with a history of hypertension, coronary artery disease with history of MI, stroke, chronic respiratory failure on 2 L nasal cannula, severe protein calorie malnutrition, alcohol dependence presenting with 3 day history of worsening shortness of breath. He states that he has had increasing coughing and some chest tightness associated with his coughing and shortness of breath.  The patient was recently discharged from the hospital after stay from 08/10/2017 through 08/17/2018 for acute on chronic respiratory failure secondary to COPD exacerbation. The patient was by himself, and he denies any sick contacts. He quit smoking approximately 4 to 5 months prior to this admission. He previously drank a sixpack of beer daily, but has not drank in a month. The patient was slow to improve during his last admisstion and his therapy was gradually escalated to include Brovana, duo nebs, Solu-Medrol. He subsequently improved after several days. CT of the chest revealed size of right lower lobe nodule. Pulmonary was consulted. They felt, the patient could be discharged to have outpatient PET scan. In addition, Dr. Luan Pulling will make referral for the patient to go to thoracic oncology clinic. He did not feel that the patient was a good surgical candidate given his end-stage COPD.   Assessment/Plan: Acute on chronic respiratory failure with hypoxia -Secondary to COPD exacerbation  -normally on 2 L at home -presently stable on3L  -wean oxygen to keep sat 88-92% -Personally reviewed chest x-ray--increased interstitial markings -Personally reviewed EKG--sinus rhythm, nonspecific T wave change -slow improvement; pt has very little reserve--remains dyspneic with minimal exertion -08/15/18--CT chest--3.-0 x 2.8 cm LLL mass abuts L-pulm artery; increase size in RLL  nodule; All concerning for malignancy  COPD exacerbation -ContinuePulmicort -Continue duo nebs -continue IV solumedrol--decrease to bid -viral respiratory panel--neg 2 weeks ago -03/23/2018 CT chest--severe emphysematous change, decreased size ofLULovoid density, scattered bilateral subpleural interstitial changes and densities in the bilateral lower lobes with interseptal thickening. -continues to wheeze and remains dyspneic with minimal exertion -restartbrovana  Acute diastolic CHF -daily weights -pt was fluid overloaded with LE edema and JVD -redose IV lasix (40 mg IV 12/9, 10 mg IV  On 12/10) -am BMP -I/Os incomplete -08/13/18-echo--EF 60-65%, no WMA, G1DD, trivial MR   Lung mass -as per CT above -pulmonary consulted last admission-->out pt PET scan and referral to thoracic oncology clinic  Essential hypertension -Continue metoprolol tartrate -BP remains acceptable -holding amlodipine and lisinopril to allow BP margin for diuresis  Severe malnutrition -Continue Ensure  Hyperlipidemia -Continue statin  Alcohol abuse -No evidence of withdrawal  Hyperglycemia -Check hemoglobin A1c--5.5 -NovoLog sliding scale -due to steroids  Atypical chest pain -Cycle troponins--neg x 3 -EKG--personally reviewed--sinus rhythm nonspecific T wave change -11/22-echo--EF 60-65%, no WMA, G1DD, trivial MR  Hypomagnesemia//Hypokalemia -repleted  Goals of Care -discussed with daughter and pt -changed to DNR Advance care planning, including the explanation and discussion of advance directives was carried out with the patient and family. Code status including explanations of "Full Code" and "DNR" and alternatives were discussed in detail. Discussion of end-of-life issues including but not limited palliative care, hospice care and the concept of hospice, other end-of-life care options, power of attorney for health care decisions, living wills, and physician orders for  life-sustaining treatment were also discussed with the patient and family. Total face to face time 16 minutes.  Disposition Plan:   Home in 1-2 days with hospice care Family Communication:   Daughter update at bedside 12/10  Consultants:  none  Code Status:  DNR  DVT Prophylaxis: [place] Lovenox   Procedures: As Listed in Progress Note Above  Antibiotics: None      Subjective: Pt breathing 50% better.  No cp, abd pain, vomiting, diarrhea, abd pain.  No f/c headache.  Still has dyspnea with mild exertion.  Has dry cough without hemoptysis  Objective: Vitals:   08/30/18 2118 08/31/18 0131 08/31/18 0515 08/31/18 0902  BP: 112/65  127/65   Pulse: 79  73   Resp:      Temp: 98.4 F (36.9 C)  98.3 F (36.8 C)   TempSrc: Oral  Oral   SpO2: 100% 98% 100% 99%  Weight:      Height:        Intake/Output Summary (Last 24 hours) at 08/31/2018 1148 Last data filed at 08/31/2018 0400 Gross per 24 hour  Intake 242.52 ml  Output 1200 ml  Net -957.48 ml   Weight change:  Exam:   General:  Pt is alert, follows commands appropriately, not in acute distress  HEENT: No icterus, No thrush, No neck mass, [place]/AT  Cardiovascular: RRR, S1/S2, no rubs, no gallops+JVD  Respiratory: diminished breath sounds bilateral bibasilar wheeze.  Bibasilar crackles  Abdomen: Soft/+BS, non tender, non distended, no guarding  Extremities: No edema, No lymphangitis, No petechiae, No rashes, no synovitis   Data Reviewed: I have personally reviewed following labs and imaging studies Basic Metabolic Panel: Recent Labs  Lab 08/30/18 0748 08/31/18 0509  NA 125* 129*  K 2.6* 2.5*  CL 66* 72*  CO2 43* 45*  GLUCOSE 133* 194*  BUN 8 10  CREATININE 0.72 0.65  CALCIUM 6.9* 6.7*  MG  --  1.4*   Liver Function Tests: No results for input(s): AST, ALT, ALKPHOS, BILITOT, PROT, ALBUMIN in the last 168 hours. No results for input(s): LIPASE, AMYLASE in the last 168 hours. No results  for input(s): AMMONIA in the last 168 hours. Coagulation Profile: No results for input(s): INR, PROTIME in the last 168 hours. CBC: Recent Labs  Lab 08/30/18 0748 08/31/18 0509  WBC 9.9 7.9  HGB 12.6* 10.5*  HCT 36.6* 30.3*  MCV 96.6 95.0  PLT 234 196   Cardiac Enzymes: Recent Labs  Lab 08/30/18 0748 08/30/18 1729 08/31/18 0018  TROPONINI <0.03 <0.03 <0.03   BNP: Invalid input(s): POCBNP CBG: No results for input(s): GLUCAP in the last 168 hours. HbA1C: No results for input(s): HGBA1C in the last 72 hours. Urine analysis:    Component Value Date/Time   COLORURINE YELLOW 08/11/2018 0944   APPEARANCEUR HAZY (A) 08/11/2018 0944   LABSPEC 1.012 08/11/2018 0944   PHURINE 8.0 08/11/2018 [place] 08/11/2018 0944   HGBUR NEGATIVE 08/11/2018 0944   BILIRUBINUR NEGATIVE 08/11/2018 0944   KETONESUR NEGATIVE 08/11/2018 0944   PROTEINUR NEGATIVE 08/11/2018 0944   UROBILINOGEN 1.0 03/10/2012 1312   NITRITE NEGATIVE 08/11/2018 0944   LEUKOCYTESUR NEGATIVE 08/11/2018 0944   Sepsis Labs: @LABRCNTIP (procalcitonin:4,lacticidven:4) )No results found for this or any previous visit (from the past 240 hour(s)).   Scheduled Meds: . aspirin EC  81 mg Oral q morning - 10a  . budesonide (PULMICORT) nebulizer solution  0.5 mg Nebulization BID  . enoxaparin (LOVENOX) injection  40 mg Subcutaneous Q24H  . feeding supplement (ENSURE ENLIVE)  237 mL Oral BID BM  . fluticasone  2 spray Each  Nare Daily  . guaiFENesin  600 mg Oral BID  . ipratropium-albuterol  3 mL Nebulization Q6H  . loratadine  10 mg Oral Daily  . magnesium oxide  400 mg Oral BID  . methylPREDNISolone (SOLU-MEDROL) injection  60 mg Intravenous Q6H  . metoprolol tartrate  150 mg Oral Daily  . metoprolol tartrate  50 mg Oral QHS  . mirtazapine  7.5 mg Oral q morning - 10a  . multivitamin with minerals   Oral Daily  . pantoprazole  40 mg Oral q morning - 10a  . phosphorus  500 mg Oral BID WC  . potassium  chloride SA  40 mEq Oral Once  . potassium chloride  40 mEq Oral Once  . rosuvastatin  40 mg Oral QPC supper   Continuous Infusions: . sodium chloride Stopped (08/30/18 0910)  . magnesium sulfate 1 - 4 g bolus IVPB      Procedures/Studies: Dg Chest 2 View  Result Date: 08/30/2018 CLINICAL DATA:  Shortness of breath, noisy respiration. History of COPD, peripheral vascular disease, previous MI, former smoker. EXAM: CHEST - 2 VIEW COMPARISON:  Chest x-ray of August 10, 2018 and chest CT scan of August 15, 2018. FINDINGS: The lungs remain mildly hyperinflated. The interstitial markings remain increased. The heart and pulmonary vascularity are normal. Subtle nodular density inferior to the anterior tip of the left second rib is again demonstrated. A previously described mass in the left lower lobe is not clearly visible. The bony thorax exhibits no acute abnormality. IMPRESSION: COPD. Chronic interstitial prominence may reflect fibrosis. No overt CHF or alveolar pneumonia. A known left lower lobe demonstrated on the August 15, 2018 chest CT scan mass is not clearly evident on this study. Electronically Signed   By: Promyse Ardito  [place] M.D.   On: 08/30/2018 08:31   Dg Chest 2 View  Result Date: 08/10/2018 CLINICAL DATA:  Worsening dyspnea times 2-3 days EXAM: CHEST - 2 VIEW COMPARISON:  Chest CT 03/23/2018 and PET-CT 06/02/2017. FINDINGS: Emphysematous hyperinflation of the lungs consistent with COPD with subpleural areas of fibrosis at the lung bases bilaterally. Ovoid nodular 13 x 21 mm density projecting over the left upper lobe is again noted, previously noted to be FDG negative on PET CT and may reflect an area parenchymal consolidation or scarring. No effusion or pulmonary edema. No aggressive osseous lesions. 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 Redemonstration of ovoid nodular density in the left upper lobe noted to be FDG negative on prior PET-CT from 06/02/2017  Electronically Signed   By: Ashley Royalty M.D.   On: 08/10/2018 17:53   Ct Chest W Contrast  Result Date: 08/15/2018 CLINICAL DATA:  Coughing and chest tightness.  Shortness of breath. EXAM: CT CHEST WITH CONTRAST TECHNIQUE: Multidetector CT imaging of the chest was performed during intravenous contrast administration. CONTRAST:  63mL ISOVUE-300 IOPAMIDOL (ISOVUE-300) INJECTION 61% COMPARISON:  03/23/2018 CT scan FINDINGS: Cardiovascular: Coronary, aortic arch, and branch vessel atherosclerotic vascular disease. Suspected right coronary artery stent. Left anterior descending coronary artery atherosclerotic calcification. Mediastinum/Nodes: Subcarinal node 0.9 cm in short axis on image 76/4, roughly stable from 03/23/2018. Lungs/Pleura: Primarily in the left lower lobe but tracking along the minor fissure, a 3.0 by 2.8 cm mass is present on image 67/4 and appears to have mild heterogeneous enhancement. This abuts the posterior margin of the left pulmonary artery and also the margin of the descending thoracic aorta. This measured about 1.2 by 0.9 cm on 03/23/2018, and accordingly unlike the other scattered  pulmonary nodules this 1 has significantly progressed in a potentially aggressive manner. Other pulmonary nodules including the bandlike partially calcified left upper lobe nodule measuring 2.0 by 1.0 cm; the 0.8 by 0.6 cm lingular nodule on image 106/4; and the pleuroparenchymal nodularity along the lung apices is stable. Along the right lung base there is some slight increase in size and solidity of the irregular 1.6 by 1.7 cm nodule on image 150/4, previously this had a less solid appearance and was slightly smaller. There is also some mild architectural distortion posteriorly in the right lower lobe on image 124/4 which is minimally more prominent, but not in a measurable fashion. Severe emphysema. Peripheral fibrosis favoring the lung bases compatible with early honeycombing and end-stage lung disease. Upper  Abdomen: Mild right adrenal nodularity is stable. Severe atrophy of the visualized part of the right kidney with some compensatory hypertrophy of the left. Musculoskeletal: Unremarkable IMPRESSION: 1. Left lower lobe retrohilar mass highly suspicious for malignant lung cancer on image 67/2. The patient has had other pulmonary nodules which have been negative on PET-CT and which have not significantly progressed. However, this particular nodule has considerably increased in size compared to the 03/23/2018 exam and is highly suspicious for lung cancer. 2. There is also some increase in size and solidity of the right basilar nodule on image 150/4 compared to the prior exam and compared to the prior PET-CT. Multifocal lung cancer is a distinct possibility. 3. Other imaging findings of potential clinical significance: Aortic Atherosclerosis (ICD10-I70.0). Coronary atherosclerosis. Emphysema (ICD10-J43.9). Peripheral fibrosis in the lung bases with honeycombing. Atrophic right kidney. Electronically Signed   By: Van Clines M.D.   On: 08/15/2018 22:29    Orson Eva, DO  [facility] Pager [phone]  If 7PM-7AM, please contact night-coverage [email or web address] Password [email or web address] 08/31/2018, 11:48 AM   LOS: 1 day

## 2018-08-31 NOTE — Progress Notes (Signed)
Patient is moderate fall risk and has refused Bed alarms at this time. Patient has been educated on the use of fall precaution measures but still refuses bed alarms.

## 2018-08-31 NOTE — Progress Notes (Signed)
CRITICAL VALUE ALERT  Critical Value:  Potassium 2.5  Date & Time Notied:  0639  Provider Notified: S. Newton   Orders Received/Actions taken: awaiting new orders

## 2018-08-31 NOTE — Care Management (Signed)
Patient Information   Patient Name Vincent Gilbert, Vincent Gilbert ([id number]) Sex Male DOB [birth year]  Room Bed  A318 A318-01  Patient Demographics   Address [address] [address] Phone [phone] (Home) *[phone] (Mobile)  Patient Ethnicity & Race   Ethnic Group Patient Race  Not Hispanic or Latino White or Caucasian  Emergency Contact(s)   Name Relation Home Work Mobile  Vincent Gilbert Daughter [phone]  Vincent Gilbert   [phone]  Documents on File    Status Date Received Description  Documents for the Patient  EMR Patient Summary Not Received    [name] Not Received    [name] E-Signature HIPAA Notice of Privacy Received 09/10/11   [name] E-Signature HIPAA Notice of Privacy Spanish Not Received    Driver's License Not Received    Insurance Card Not Received    Advance Directives/Living Will/HCPOA/POA Not Received    Editor, commissioning Not Received    Insurance Card Not Received    Insurance Card Received 04/11/14 MEDICARE & MUTUAL OF [employer] Card Received    Release of Information Not Received  AUTH ABD AORTA 04-23-41  [facility] Policy for Pain - E Signature Not Received    AMB Intake Forms/Questionnaires Not Received    AMB Intake Forms/Questionnaires Not Received    AMB Patient Logs/Info  02/24/13   HIM ROI Authorization (Expired) 12/19/13 Department of [place] Authorization  12/19/13   Insurance Card     HIM ROI Authorization (Expired) 05/02/14 Department of [place] Authorization  05/11/14 Department of [place] [facility] Record  08/12/13 [name]  AMB Outside Hospital Record  08/10/13 H&P [name]  08/10/13 [name]  AMB Correspondence  04/11/14 EMAIL  AMB Provider Completed Forms  05/02/14 DISABILITY  [facility]  Other Photo ID Not Received    Concealed Handgun Permits  35/36/14   Insurance Card Received 07/10/15 [facility]  HIM ROI Authorization  11/08/15 PT AUTH/DISABILITY QUESTIONNAIRE  Insurance Card     AMB Provider Completed Forms  12/04/15 DISABILITY BENEFITS QUESTIONNAIRE [facility]  [facility]IM ROI Authorization  02/01/16 PATIENT REQUESTED STRESS TEST SIGNED FORM  Insurance Card   MCR/MOO/UHC 06/2016  AMB Outside Hospital Record  05/04/17 [name] 07/20/17 new medicare  HIM ROI Authorization  04/12/18 [name]  HIM ROI Authorization  08/26/18 [name]  HIM ROI Authorization  08/26/18 [name]  HIM ROI Authorization  43/15/40 [name]  AMB Outside Hospital Record (Deleted) 05/04/17 D/S REPORT [facility] [place]  HIM Release of Information Output (Deleted) 04/12/18 Requested records  HIM Release of Information Output  08/26/18 Requested records  HIM Release of Information Output  08/26/18 Requested records  HIM Release of Information Output  08/26/18 Requested records  HIM Release of Information Output  08/26/18 Requested records  Documents for the Encounter  AOB (Assignment of Insurance Benefits) Not Received    E-signature AOB Signed 08/30/18   MEDICARE RIGHTS Not Received    E-signature Medicare Rights Signed 08/30/18   ED Patient Billing Extract   ED PB Billing Extract  Cardiac Monitoring Strip Received 08/30/18   Cardiac Monitoring Strip Shift Summary Received 08/30/18   Admission Information   Attending Provider Admitting Provider Admission Type Admission Date/Time  Tat, Shanon Brow, MD Vincent Eva,  MD Emergency 08/30/18 0720  Discharge Date Hospital Service Auth/Cert Status Service Area   Internal Medicine Incomplete [facility]  Unit Room/Bed Admission Status   AP-DEPT 300 A318/A318-01 Admission (Confirmed)   Admission   Complaint  .  Hospital  Account   Name Acct ID Class Status Primary Coverage  Vincent Gilbert [id number] Inpatient Open MEDICARE - MEDICARE PART A AND B      Guarantor Account (for Hospital Account [id number])   Name Relation to Pt Service Area Active? Acct Type  Vincent Gilbert Self CHSA Yes Personal/Family  Address Phone    [place], [place] [address] [phone])        Coverage Information (for Hospital Account [id number])   1. [place] PART A AND B   F/O Payor/Plan Precert #  MEDICARE/MEDICARE PART A AND B   Subscriber Subscriber #  Vincent Gilbert [id number]  Address Phone  [address] [place], [place] [address]   2. UNITED HEALTHCARE/UNITED HEALTHCARE OTHER   F/O Payor/Plan Precert #  [facility] OTHER   Subscriber Subscriber #  Vincent Gilbert [id number]  Address Phone  [address] [place] [address] [phone]       Care Everywhere ID:  [id number]

## 2018-08-31 NOTE — Care Management Important Message (Signed)
Important Message  Patient Details  Name: Vincent Gilbert MRN: [id number] Date of Birth: 05-28-1946   Medicare Important Message Given:  Yes    [name] 08/31/2018, 1:06 PM

## 2018-08-31 NOTE — Care Management Note (Signed)
Case Management Note  Patient Details  Name: Vincent Gilbert MRN: [id number] Date of Birth: 1945/11/13  Subjective/Objective:     Admitted with COPD. From home, lives alone. Has family support. CM consulted for hospice referral. In speaking with pt, he does not want hospice services at this time. CM discussed provider options for [facility] (unable to print from CMS website) and pt has used [facility] for family members. He wants to use that agency again. Pt is okay with hospice rep coming to discuss services.                Action/Plan: DC home with self care. CM has faxed referral to [facility] and called to clarify pt only wants to discuss services.   Expected Discharge Date:  09/02/18               Expected Discharge Plan:  Home w Hospice Care  In-House Referral:  NA  Discharge planning Services  CM Consult  Status of Service:  Completed, signed off  If discussed at [place] of Stay Meetings, dates discussed:    Additional Comments:  Sherald Barge, RN 08/31/2018, 3:09 PM

## 2018-09-01 DIAGNOSIS — J441 Chronic obstructive pulmonary disease with (acute) exacerbation: Secondary | ICD-10-CM

## 2018-09-01 DIAGNOSIS — I5031 Acute diastolic (congestive) heart failure: Secondary | ICD-10-CM

## 2018-09-01 DIAGNOSIS — J9621 Acute and chronic respiratory failure with hypoxia: Secondary | ICD-10-CM

## 2018-09-01 DIAGNOSIS — E43 Unspecified severe protein-calorie malnutrition: Secondary | ICD-10-CM

## 2018-09-01 DIAGNOSIS — Z7189 Other specified counseling: Secondary | ICD-10-CM

## 2018-09-01 LAB — BASIC METABOLIC PANEL
Anion gap: 11 (ref 5–15)
BUN: 9 mg/dL (ref 8–23)
CO2: 45 mmol/L — ABNORMAL HIGH (ref 22–32)
CREATININE: 0.59 mg/dL — AB (ref 0.61–1.24)
Calcium: 7 mg/dL — ABNORMAL LOW (ref 8.9–10.3)
Chloride: 77 mmol/L — ABNORMAL LOW (ref 98–111)
GFR calc Af Amer: >60 mL/min (ref >60)
GFR calc non Af Amer: >60 mL/min (ref >60)
Glucose, Bld: 171 mg/dL — ABNORMAL HIGH (ref 70–99)
Potassium: 2.3 mmol/L — CL (ref 3.5–5.1)
Sodium: 133 mmol/L — ABNORMAL LOW (ref 135–145)

## 2018-09-01 LAB — PHOSPHORUS: Phosphorus: 2.4 mg/dL — ABNORMAL LOW (ref 2.5–4.6)

## 2018-09-01 LAB — MAGNESIUM: Magnesium: 1.7 mg/dL (ref 1.7–2.4)

## 2018-09-01 MED ORDER — POTASSIUM CHLORIDE CRYS ER 20 MEQ PO TBCR
40.0000 meq | EXTENDED_RELEASE_TABLET | Freq: Three times a day (TID) | ORAL | Status: DC
  Administered 2018-09-01 (×3): 40 meq via ORAL
  Filled 2018-09-01 (×3): qty 2

## 2018-09-01 MED ORDER — MAGNESIUM SULFATE IN D5W 1-5 GM/100ML-% IV SOLN
1.0000 g | Freq: Once | INTRAVENOUS | Status: AC
  Administered 2018-09-01: 1 g via INTRAVENOUS
  Filled 2018-09-01: qty 100

## 2018-09-01 NOTE — Progress Notes (Signed)
Met with Mr Janczak and his daughter to discuss Advance Directives. He completed his and I place a copy in his chart and gave him the original and other copies.

## 2018-09-01 NOTE — Progress Notes (Signed)
Critical lab value 2.3 texted paged Dr Roderic Palau.

## 2018-09-01 NOTE — Progress Notes (Signed)
PROGRESS NOTE  Vincent Gilbert  VQQ:[id number] DOB: 03/04/1946 DOA: 08/30/2018 PCP: Glenda Chroman, MD  Outpatient Specialists: Dr. Luan Pulling Brief Narrative: 72 year old male with a history of hypertension, coronary artery disease with history of MI, stroke, chronic respiratory failure on 2 L nasal cannula, severe protein calorie malnutrition, alcohol dependence presenting with3 dayhistory of worsening shortness of breath. He states that he has had increasing coughing and some chest tightness associated with his coughing and shortness of breath. The patient was recently discharged from the hospital after stay from 08/10/2017 through 08/17/2018 for acute on chronic respiratory failure secondary to COPD exacerbation. The patient was by himself, and he denies any sick contacts. He quit smoking approximately 4 to 5 months prior to this admission. He previously drank a sixpack of beer daily, but has not drank in a month. The patient was slow to improve during his last admisstion and his therapy was gradually escalated to include Brovana, duo nebs, Solu-Medrol. He subsequently improved after several days. CT of the chest revealed size of right lower lobe nodule. Pulmonary was consulted. They felt, the patient could be discharged to have outpatient PET scan. In addition, Dr. Luan Pulling will make referral for the patient to go to thoracic oncology clinic. He did not feel that the patient was a good surgical candidate given his end-stage COPD.  Assessment & Plan: Active Problems:   Protein-calorie malnutrition, severe   Acute on chronic respiratory failure with hypoxia (HCC)   COPD with acute exacerbation (HCC)   Goals of care, counseling/discussion   Acute diastolic CHF (congestive heart failure) (HCC)  Acute on chronic respiratory failure with hypoxia: Due to exacerbation of severe COPD, less-so due to CHF and possibly lung CA, and minimal-to-no reserve.  - Continue supplemental oxygen for SpO2  goal 88-95%.  - Treat COPD and acute CHF as below.   COPD exacerbation:  - Continue IV steroids, duonebs, pulmicort. Wheezing improved but remains despite aggressive measures.   Pulmonary mass/nodules: 08/15/18 CT chest shows 3.-0 x 2.8 cm LLL mass abuts L-pulm artery; increase size in RLL nodule; All concerning for malignancy. - Pulmonology aware and arranging outpatient PET scanning with referral to CT surgery, though this will depend on goals of care discussions going forward which will depend on clinical trajectory. - Discussed with patient and family today  Acute diastolic CHF: 43/32/95-JOAC--ZY 60-65%, no WMA, G1DD, trivial MR - Continue po lasix - Monitor I/O, weights. LE edema improved but still with JVD.   Hypokalemia: Severe.  - Give 90mEq TID and supplement Mag empirically. Recheck both in AM.  - Will still continue gentle po diuresis with lasix 20mg .  HTN: continue metoprolol - Hold norvasc and lisinopril with need for diuresis.  Severe malnutrition -Continue Ensure  Hyperlipidemia - Continue statin  Alcohol abuse - No evidence of withdrawal  Hyperglycemia: Due to steroids. HbA1c 5.5%.  Atypical chest pain - ACS ruled out  DVT prophylaxis: Lovenox Code Status: DNR Family Communication: Daughter at bedside on repeat PM rounds. Was confirmed as HCPOA today.  Disposition Plan: Home with hospice once more stable.   Consultants:   Palliative care team  Will let Dr. Luan Pulling know the patient is admitted  Procedures:   None  Antimicrobials:  None   Subjective: Breathing is very difficult, no better from yesterday. Unable to stand without significant dyspnea, getting up to sit and work on a crossword puzzle worsens the dyspnea. No chest pain or current wheezing. +nonproductive cough, unchanged.  Objective: Vitals:   09/01/18 6063  09/01/18 0943 09/01/18 1346 09/01/18 1428  BP:  (!) 121/59  129/61  Pulse:  80  83  Resp:    20  Temp:    98.2 F  (36.8 C)  TempSrc:    Oral  SpO2: 95%  97% 100%  Weight:      Height:        Intake/Output Summary (Last 24 hours) at 09/01/2018 1751 Last data filed at 09/01/2018 1500 Gross per 24 hour  Intake 100 ml  Output 800 ml  Net -700 ml   Filed Weights   08/30/18 0729  Weight: 60.3 kg    Gen: Frail 72yo male in no distress Pulm: Nonlabored with supplemental oxygen at rest. Diminished diffusely with expiratory prolongations and scant wheezing.  CV: Regular rate and rhythm. No murmur, rub, or gallop. Mild JVD, no pedal edema. GI: Abdomen soft, non-tender, non-distended, with normoactive bowel sounds. No organomegaly or masses felt. Ext: Warm, no deformities Skin: No rashes, lesions no ulcers Neuro: Alert and oriented. No focal neurological deficits. Psych: Judgement and insight appear normal. Mood & affect appropriate.   Data Reviewed: I have personally reviewed following labs and imaging studies  CBC: Recent Labs  Lab 08/30/18 0748 08/31/18 0509  WBC 9.9 7.9  HGB 12.6* 10.5*  HCT 36.6* 30.3*  MCV 96.6 95.0  PLT 234 093   Basic Metabolic Panel: Recent Labs  Lab 08/30/18 0748 08/31/18 0509 09/01/18 0510  NA 125* 129* 133*  K 2.6* 2.5* 2.3*  CL 66* 72* 77*  CO2 43* 45* 45*  GLUCOSE 133* 194* 171*  BUN 8 10 9   CREATININE 0.72 0.65 0.59*  CALCIUM 6.9* 6.7* 7.0*  MG  --  1.4* 1.7  PHOS  --   --  2.4*   GFR: Estimated Creatinine Clearance: 73.3 mL/min (A) (by C-G formula based on SCr of 0.59 mg/dL (L)). Liver Function Tests: No results for input(s): AST, ALT, ALKPHOS, BILITOT, PROT, ALBUMIN in the last 168 hours. No results for input(s): LIPASE, AMYLASE in the last 168 hours. No results for input(s): AMMONIA in the last 168 hours. Coagulation Profile: No results for input(s): INR, PROTIME in the last 168 hours. Cardiac Enzymes: Recent Labs  Lab 08/30/18 0748 08/30/18 1729 08/31/18 0018  TROPONINI <0.03 <0.03 <0.03   BNP (last 3 results) No results for  input(s): PROBNP in the last 8760 hours. HbA1C: No results for input(s): HGBA1C in the last 72 hours. CBG: No results for input(s): GLUCAP in the last 168 hours. Lipid Profile: No results for input(s): CHOL, HDL, LDLCALC, TRIG, CHOLHDL, LDLDIRECT in the last 72 hours. Thyroid Function Tests: No results for input(s): TSH, T4TOTAL, FREET4, T3FREE, THYROIDAB in the last 72 hours. Anemia Panel: No results for input(s): VITAMINB12, FOLATE, FERRITIN, TIBC, IRON, RETICCTPCT in the last 72 hours. Urine analysis:    Component Value Date/Time   COLORURINE YELLOW 08/11/2018 0944   APPEARANCEUR HAZY (A) 08/11/2018 0944   LABSPEC 1.012 08/11/2018 0944   PHURINE 8.0 08/11/2018 [place] 08/11/2018 0944   HGBUR NEGATIVE 08/11/2018 [place] 08/11/2018 [place] 08/11/2018 0944   PROTEINUR NEGATIVE 08/11/2018 0944   UROBILINOGEN 1.0 03/10/2012 1312   NITRITE NEGATIVE 08/11/2018 0944   LEUKOCYTESUR NEGATIVE 08/11/2018 0944   No results found for this or any previous visit (from the past 240 hour(s)).    Radiology Studies: No results found.  Scheduled Meds: . aspirin EC  81 mg Oral q morning - 10a  .  budesonide (PULMICORT) nebulizer solution  0.5 mg Nebulization BID  . enoxaparin (LOVENOX) injection  40 mg Subcutaneous Q24H  . feeding supplement (ENSURE ENLIVE)  237 mL Oral BID BM  . fluticasone  2 spray Each Nare Daily  . furosemide  20 mg Oral Daily  . guaiFENesin  600 mg Oral BID  . ipratropium-albuterol  3 mL Nebulization Q6H  . loratadine  10 mg Oral Daily  . magnesium oxide  400 mg Oral BID  . methylPREDNISolone (SOLU-MEDROL) injection  60 mg Intravenous Q6H  . metoprolol tartrate  50 mg Oral BID  . mirtazapine  7.5 mg Oral q morning - 10a  . multivitamin with minerals   Oral Daily  . pantoprazole  40 mg Oral q morning - 10a  . potassium chloride SA  40 mEq Oral Once  . potassium chloride  40 mEq Oral Once  . potassium chloride   40 mEq Oral TID  . rosuvastatin  40 mg Oral QPC supper   Continuous Infusions: . sodium chloride Stopped (08/30/18 0910)     LOS: 2 days   Time spent: 25 minutes.  Patrecia Pour, MD [facility] [email or web address] Password [email or web address] 09/01/2018, 5:51 PM

## 2018-09-02 ENCOUNTER — Ambulatory Visit: Payer: Self-pay | Admitting: *Deleted

## 2018-09-02 LAB — BASIC METABOLIC PANEL
Anion gap: 10 (ref 5–15)
BUN: 12 mg/dL (ref 8–23)
CO2: 41 mmol/L — ABNORMAL HIGH (ref 22–32)
Calcium: 7.7 mg/dL — ABNORMAL LOW (ref 8.9–10.3)
Chloride: 83 mmol/L — ABNORMAL LOW (ref 98–111)
Creatinine, Ser: 0.58 mg/dL — ABNORMAL LOW (ref 0.61–1.24)
GFR calc Af Amer: >60 mL/min (ref >60)
GFR calc non Af Amer: >60 mL/min (ref >60)
Glucose, Bld: 137 mg/dL — ABNORMAL HIGH (ref 70–99)
Potassium: 3.4 mmol/L — ABNORMAL LOW (ref 3.5–5.1)
Sodium: 134 mmol/L — ABNORMAL LOW (ref 135–145)

## 2018-09-02 LAB — MAGNESIUM: Magnesium: 1.9 mg/dL (ref 1.7–2.4)

## 2018-09-02 MED ORDER — DOCUSATE SODIUM 100 MG PO CAPS: 100.0000 mg | ORAL_CAPSULE | Freq: Two times a day (BID) | ORAL | Status: DC | PRN

## 2018-09-02 MED ORDER — BISACODYL 10 MG RE SUPP: 10.0000 mg | Freq: Every day | RECTAL | Status: DC | PRN

## 2018-09-02 MED ORDER — MAGNESIUM SULFATE IN D5W 1-5 GM/100ML-% IV SOLN
1.0000 g | Freq: Once | INTRAVENOUS | Status: AC
  Administered 2018-09-02: 1 g via INTRAVENOUS
  Filled 2018-09-02 (×2): qty 100

## 2018-09-02 MED ORDER — POTASSIUM CHLORIDE CRYS ER 20 MEQ PO TBCR
40.0000 meq | EXTENDED_RELEASE_TABLET | Freq: Two times a day (BID) | ORAL | Status: DC
  Administered 2018-09-02 – 2018-09-03 (×3): 40 meq via ORAL
  Filled 2018-09-02 (×2): qty 2

## 2018-09-02 MED ORDER — PREDNISONE 20 MG PO TABS
50.0000 mg | ORAL_TABLET | Freq: Every day | ORAL | Status: DC
  Administered 2018-09-03: 50 mg via ORAL
  Filled 2018-09-02: qty 2

## 2018-09-02 NOTE — Progress Notes (Signed)
PROGRESS NOTE  Vincent Gilbert  UJW:[id number] DOB: 11-Sep-1946 DOA: 08/30/2018 PCP: Glenda Chroman, MD  Outpatient Specialists: Dr. Luan Pulling Brief Narrative: 72 year old male with a history of hypertension, coronary artery disease with history of MI, stroke, chronic respiratory failure on 2 L nasal cannula, severe protein calorie malnutrition, pulmonary nodules, alcohol dependence presenting with3 dayhistory of worsening shortness of breath. He states that he has had increasing coughing and some chest tightness associated with his coughing and shortness of breath. The patient was recently discharged from the hospital after stay from 08/10/2017 through 08/17/2018 for acute on chronic respiratory failure secondary to COPD exacerbation and was readmitted for the same on 12/9.   IV steroids were given and escalated due to severity of lung disease.   CT of the chest during previous admission revealed size of right lower lobe nodule. Pulmonary was consulted, recommended outpatient PET scan which will be rescheduled.  Assessment & Plan: Active Problems:   Protein-calorie malnutrition, severe   Acute on chronic respiratory failure with hypoxia (HCC)   COPD with acute exacerbation (HCC)   Goals of care, counseling/discussion   Acute diastolic CHF (congestive heart failure) (HCC)  Acute on chronic respiratory failure with hypoxia: Due to exacerbation of severe COPD, less-so due to CHF and possibly lung CA, and minimal-to-no reserve.  - Continue supplemental oxygen for SpO2 goal 88-95%.  - Treat COPD and acute CHF as below.   COPD exacerbation: Starting to make some progress 12/12.  - Continue, duonebs, pulmicort. Transition to prednisone.   Pulmonary mass/nodules: 08/15/18 CT chest shows 3.-0 x 2.8 cm LLL mass abuts L-pulm artery; increase size in RLL nodule; All concerning for malignancy. - Pulmonology aware and arranging outpatient PET scanning with referral to CT surgery, though this will  depend on goals of care discussions going forward which will depend on clinical trajectory. - Discussed with patient and family at length.  Acute diastolic CHF: 56/21/30-QMVH--QI 60-65%, no WMA, G1DD, trivial MR - Continue po lasix. Reports significant swelling he attributes to prednisone, though I suspect he requires daily diuretic at this time as swelling is controlled with daily lasix which we will continue at discharge. Reviewed daily weights and action plan at home.  - Monitor I/O, weights.  Hypokalemia: Severe. Improved with replacement.  - Will continue replacement 44mEq BID today in light of ongoing loop diuretic and supp Mg. Recheck both in AM.    HTN: continue metoprolol - Holding norvasc and lisinopril with need for diuresis.   Severe protein calorie malnutrition - Continue ensure  Hyperlipidemia - Continue statin  Alcohol abuse - No evidence of withdrawal  Hyperglycemia: Due to steroids. HbA1c 5.5%.  Atypical chest pain - ACS ruled out  DVT prophylaxis: Lovenox Code Status: DNR Family Communication: Daughter at bedside this afternoon.   Disposition Plan: Home with hospice if clinical progress continues and stable 12/13.   Consultants:   Palliative care team  Dr. Luan Pulling paid a social visit 12/12.  Procedures:   None  Antimicrobials:  None   Subjective: Wheezing less, breathing more easily at rest. Still very dyspneic limiting ambulation which is not his premorbid baseline. +cough stable no chest pain.  Objective: Vitals:   09/02/18 0337 09/02/18 0523 09/02/18 0742 09/02/18 0744  BP:  (!) 152/71    Pulse:  86    Resp:  18    Temp:  98.6 F (37 C)    TempSrc:  Oral    SpO2: 99% 100% 99% 100%  Weight:  Height:        Intake/Output Summary (Last 24 hours) at 09/02/2018 1351 Last data filed at 09/02/2018 0900 Gross per 24 hour  Intake 1640 ml  Output -  Net 1640 ml   Filed Weights   08/30/18 0729  Weight: 60.3 kg   Gen:  Frail elderly male in no distress Pulm: Nonlabored breathing supplemental oxygen. Diminished with no expiratory wheezing or crackles. CV: Regular rate and rhythm. No murmur, rub, or gallop. No JVD today, RLE 1+, trace LLE dependent edema. GI: Abdomen soft, non-tender, non-distended, with normoactive bowel sounds.  Ext: Warm, no deformities Skin: No rashes, lesions or ulcers on visualized skin.  Neuro: Alert and oriented. No focal neurological deficits. Psych: Judgement and insight appear fair. Mood euthymic & affect congruent. Behavior is appropriate.    Data Reviewed: I have personally reviewed following labs and imaging studies  CBC: Recent Labs  Lab 08/30/18 0748 08/31/18 0509  WBC 9.9 7.9  HGB 12.6* 10.5*  HCT 36.6* 30.3*  MCV 96.6 95.0  PLT 234 539   Basic Metabolic Panel: Recent Labs  Lab 08/30/18 0748 08/31/18 0509 09/01/18 0510 09/02/18 0433  NA 125* 129* 133* 134*  K 2.6* 2.5* 2.3* 3.4*  CL 66* 72* 77* 83*  CO2 43* 45* 45* 41*  GLUCOSE 133* 194* 171* 137*  BUN 8 10 9 12   CREATININE 0.72 0.65 0.59* 0.58*  CALCIUM 6.9* 6.7* 7.0* 7.7*  MG  --  1.4* 1.7 1.9  PHOS  --   --  2.4*  --    GFR: Estimated Creatinine Clearance: 73.3 mL/min (A) (by C-G formula based on SCr of 0.58 mg/dL (L)). Liver Function Tests: No results for input(s): AST, ALT, ALKPHOS, BILITOT, PROT, ALBUMIN in the last 168 hours. No results for input(s): LIPASE, AMYLASE in the last 168 hours. No results for input(s): AMMONIA in the last 168 hours. Coagulation Profile: No results for input(s): INR, PROTIME in the last 168 hours. Cardiac Enzymes: Recent Labs  Lab 08/30/18 0748 08/30/18 1729 08/31/18 0018  TROPONINI <0.03 <0.03 <0.03   BNP (last 3 results) No results for input(s): PROBNP in the last 8760 hours. HbA1C: No results for input(s): HGBA1C in the last 72 hours. CBG: No results for input(s): GLUCAP in the last 168 hours. Lipid Profile: No results for input(s): CHOL, HDL,  LDLCALC, TRIG, CHOLHDL, LDLDIRECT in the last 72 hours. Thyroid Function Tests: No results for input(s): TSH, T4TOTAL, FREET4, T3FREE, THYROIDAB in the last 72 hours. Anemia Panel: No results for input(s): VITAMINB12, FOLATE, FERRITIN, TIBC, IRON, RETICCTPCT in the last 72 hours. Urine analysis:    Component Value Date/Time   COLORURINE YELLOW 08/11/2018 0944   APPEARANCEUR HAZY (A) 08/11/2018 0944   LABSPEC 1.012 08/11/2018 0944   PHURINE 8.0 08/11/2018 [place] 08/11/2018 0944   HGBUR NEGATIVE 08/11/2018 [place] 08/11/2018 [place] 08/11/2018 0944   PROTEINUR NEGATIVE 08/11/2018 0944   UROBILINOGEN 1.0 03/10/2012 1312   NITRITE NEGATIVE 08/11/2018 0944   LEUKOCYTESUR NEGATIVE 08/11/2018 0944   No results found for this or any previous visit (from the past 240 hour(s)).    Radiology Studies: No results found.  Scheduled Meds: . aspirin EC  81 mg Oral q morning - 10a  . budesonide (PULMICORT) nebulizer solution  0.5 mg Nebulization BID  . enoxaparin (LOVENOX) injection  40 mg Subcutaneous Q24H  . feeding supplement (ENSURE ENLIVE)  237 mL Oral BID BM  . fluticasone  2  spray Each Nare Daily  . furosemide  20 mg Oral Daily  . guaiFENesin  600 mg Oral BID  . ipratropium-albuterol  3 mL Nebulization Q6H  . loratadine  10 mg Oral Daily  . magnesium oxide  400 mg Oral BID  . metoprolol tartrate  50 mg Oral BID  . mirtazapine  7.5 mg Oral q morning - 10a  . multivitamin with minerals   Oral Daily  . pantoprazole  40 mg Oral q morning - 10a  . potassium chloride SA  40 mEq Oral Once  . potassium chloride  40 mEq Oral Once  . potassium chloride  40 mEq Oral BID  . [START ON 09/03/2018] predniSONE  50 mg Oral Q breakfast  . rosuvastatin  40 mg Oral QPC supper   Continuous Infusions: . sodium chloride Stopped (08/30/18 0910)  . magnesium sulfate 1 - 4 g bolus IVPB 1 g (09/02/18 1308)     LOS: 3 days   Time spent: 25  minutes.  Patrecia Pour, MD [facility] [email or web address] Password [email or web address] 09/02/2018, 1:51 PM

## 2018-09-03 LAB — BASIC METABOLIC PANEL
Anion gap: 6 (ref 5–15)
BUN: 10 mg/dL (ref 8–23)
CO2: 40 mmol/L — ABNORMAL HIGH (ref 22–32)
CREATININE: 0.53 mg/dL — AB (ref 0.61–1.24)
Calcium: 7.5 mg/dL — ABNORMAL LOW (ref 8.9–10.3)
Chloride: 86 mmol/L — ABNORMAL LOW (ref 98–111)
GFR calc Af Amer: >60 mL/min (ref >60)
GFR calc non Af Amer: >60 mL/min (ref >60)
Glucose, Bld: 85 mg/dL (ref 70–99)
Potassium: 3.7 mmol/L (ref 3.5–5.1)
Sodium: 132 mmol/L — ABNORMAL LOW (ref 135–145)

## 2018-09-03 LAB — MAGNESIUM: Magnesium: 1.7 mg/dL (ref 1.7–2.4)

## 2018-09-03 MED ORDER — PREDNISONE 20 MG PO TABS: ORAL_TABLET | ORAL | 0 refills | Status: DC

## 2018-09-03 MED ORDER — IPRATROPIUM-ALBUTEROL 0.5-2.5 (3) MG/3ML IN SOLN: 3.0000 mL | Freq: Four times a day (QID) | RESPIRATORY_TRACT | 0 refills | Status: AC | PRN

## 2018-09-03 MED ORDER — POTASSIUM CHLORIDE ER 20 MEQ PO TBCR: 40.0000 meq | EXTENDED_RELEASE_TABLET | Freq: Every day | ORAL | 0 refills | Status: AC

## 2018-09-03 MED ORDER — FUROSEMIDE 20 MG PO TABS: 20.0000 mg | ORAL_TABLET | Freq: Every day | ORAL | 0 refills | Status: AC

## 2018-09-03 MED ORDER — LISINOPRIL 10 MG PO TABS
40.0000 mg | ORAL_TABLET | Freq: Every day | ORAL | Status: DC
  Administered 2018-09-03: 40 mg via ORAL
  Filled 2018-09-03: qty 4

## 2018-09-03 NOTE — Progress Notes (Signed)
Discharge instructions reviewed with patient and daughter at bedside. Given AVS, prednisone prescription, other prescriptions sent to his pharmacy per MD. Patient and daughter aware to pick them up. Daughter states she called Dr. Luan Pulling office to setup f/u appointment. Verbalized understanding of instructions. IV site d/c'd and site within normal limits. Hospice to see patient after discharge, arranged per CM. Portable O2 brought by his daughter for ride home. Pt left floor in stable condition via w/c accompanied by nurse tech. Donavan Foil, RN

## 2018-09-03 NOTE — Discharge Summary (Signed)
Physician Discharge Summary  Vincent Gilbert CWC:[id number] DOB: 1946-08-10 DOA: 08/30/2018  PCP: Glenda Chroman, MD  Admit date: 08/30/2018 Discharge date: 09/03/2018  Admitted From: Home Disposition: Home   Recommendations for Outpatient Follow-up:  1. Follow up with PCP in 1-2 weeks. 2. Follow up with Dr. Luan Pulling and have PET-CT rescheduled as soon as possible. Pt requests change of PCP to Dr. Luan Pulling if this is possible. Will need to decide on duration of steroid taper.  3. Please obtain BMP at follow up. Pt started on lasix, potassium, and magnesium supplements.   Home Health: Hospice Equipment/Devices: Hospital bed, 3L O2 Discharge Condition: Stable, improved CODE STATUS: DNR Diet recommendation: Heart healthy  Brief/Interim Summary: Vincent Gilbert is a 72 year old male with a history of hypertension, coronary artery disease with history of MI, stroke, chronic respiratory failure on 2 L nasal cannula, severe protein calorie malnutrition, pulmonary nodules, alcohol dependence presenting with3 dayhistory of worsening shortness of breath. He states that he has had increasing coughing and some chest tightness associated with his coughing and shortness of breath. The patient was recently discharged from the hospital after stay from 08/10/2017 through 08/17/2018 for acute on chronic respiratory failure secondary to COPD exacerbation and was readmitted for the same on 12/9.   IV steroids were given and escalated due to severity of lung disease with eventual improvement. After transition to oral steroids on 12/13 he has shown stable improvement and is ready for discharge.   CT of the chest during previous admission revealed size of right lower lobe nodule. Pulmonary was consulted, recommended outpatient PET scan which was scheduled to occur but was missed due to hospitalization. This will be rescheduled.  Discharge Diagnoses:  Active Problems:   Protein-calorie malnutrition,  severe   Acute on chronic respiratory failure with hypoxia (HCC)   COPD with acute exacerbation (HCC)   Goals of care, counseling/discussion   Acute diastolic CHF (congestive heart failure) (HCC)  Acute on chronic respiratory failure with hypoxia: Due to exacerbation of severe COPD, less-so due to CHF and possibly lung CA, and minimal-to-no reserve.  - Continue supplemental oxygen for SpO2 goal 88-95%. Discharging on 3L.   COPD exacerbation: Starting to make some progress 12/12.  - Continue duonebs, pulmicort at home. Transitioned to prednisone which will be tapered slowly. May soon be steroid-dependent.   Pulmonary mass/nodules: 08/15/18 CT chest shows 3.-0 x 2.8 cm LLL mass abuts the left pulmonary artery; increase size in RLL nodule; All concerning for malignancy. - Pulmonology aware and arranging outpatient PET scan. Plan also includes CT surgery referral. - Discussed with patient that these findings are concerning and require prompt attention at follow up.  Acute diastolic CHF: 76/16/07-PXTG--GY 60-65%, no WMA, G1DD, trivial MR - Continue po lasix daily which has been given here with stable renal function and improved volume status. Reports significant swelling he attributes to prednisone, though I suspect he requires daily diuretic at this time as swelling is controlled with daily lasix which we will continue at discharge. Reviewed daily weights and action plan at home.  - Monitor I/O, weights.  Hypokalemia: Severe. Improved with replacement.  - Will continue replacement 26mEq daily along with magnesium BID in light of ongoing loop diuretic.   HTN:  - Continue metoprolol, norvasc and lisinopril at discharge   Severe protein calorie malnutrition - Continue ensure  Hyperlipidemia - Continue statin  Alcohol abuse - No evidence of withdrawal  Hyperglycemia: Due to steroids. HbA1c 5.5%.  Atypical chest pain - ACS ruled  out  Discharge Instructions Discharge  Instructions    (HEART FAILURE PATIENTS) Call MD:  Anytime you have any of the following symptoms: 1) 3 pound weight gain in 24 hours or 5 pounds in 1 week 2) shortness of breath, with or without a dry hacking cough 3) swelling in the hands, feet or stomach 4) if you have to sleep on extra pillows at night in order to breathe.   Complete by:  As directed    Diet - low sodium heart healthy   Complete by:  As directed    Discharge instructions   Complete by:  As directed    You were admitted for shortness of breath and have improved with treatment for COPD and use of lasix for fluid retention. You are stable for discharge with the following recommendations:  - Follow up with Dr. Luan Pulling soon. I've made him aware of your discharge today.  - Continue taking lasix every day. Also take potassium daily and continue taking magnesium twice daily.  - A new prescription for duonebs was sent to your pharmacy. - Continue taking prednisone daily, a taper is prescribed on a printed prescription which you should continue until advised further by your PCP or Dr. Luan Pulling.  - If your symptoms worsen, call hospice or seek medical attention right away.   Increase activity slowly   Complete by:  As directed      Allergies as of 09/03/2018      Reactions   Oxycodone    nausea   Codeine Palpitations      Medication List    STOP taking these medications   albuterol 108 (90 Base) MCG/ACT inhaler Commonly known as:  PROVENTIL HFA;VENTOLIN HFA   phosphorus 155-852-130 MG tablet Commonly known as:  K PHOS NEUTRAL   torsemide 20 MG tablet Commonly known as:  DEMADEX     TAKE these medications   amLODipine 10 MG tablet Commonly known as:  NORVASC Take 10 mg by mouth daily.   aspirin EC 81 MG tablet Take 81 mg by mouth every morning.   budesonide-formoterol 160-4.5 MCG/ACT inhaler Commonly known as:  SYMBICORT Inhale 2 puffs into the lungs 2 (two) times daily.   cetirizine 10 MG tablet Commonly  known as:  ZYRTEC Take 10 mg by mouth daily.   feeding supplement (ENSURE ENLIVE) Liqd Take 237 mLs by mouth 2 (two) times daily between meals.   fluticasone 50 MCG/ACT nasal spray Commonly known as:  FLONASE Place 2 sprays into both nostrils daily.   furosemide 20 MG tablet Commonly known as:  LASIX Take 1 tablet (20 mg total) by mouth daily. Start taking on:  September 04, 2018   guaiFENesin 600 MG 12 hr tablet Commonly known as:  MUCINEX Take 600 mg by mouth 2 (two) times daily.   ipratropium-albuterol 0.5-2.5 (3) MG/3ML Soln Commonly known as:  DUONEB Take 3 mLs by nebulization every 6 (six) hours as needed (shortness of breath and wheeze).   lisinopril 40 MG tablet Commonly known as:  PRINIVIL,ZESTRIL Take 1 tablet (40 mg total) by mouth daily.   LORazepam 0.5 MG tablet Commonly known as:  ATIVAN Take 1 tablet (0.5 mg total) by mouth every 6 (six) hours as needed for anxiety.   magnesium oxide 400 (241.3 Mg) MG tablet Commonly known as:  MAG-OX Take 1 tablet (400 mg total) by mouth 2 (two) times daily.   metoprolol tartrate 100 MG tablet Commonly known as:  LOPRESSOR TAKE 1 TABLET (100 MG TOTAL) BY MOUTH  2 (TWO) TIMES DAILY. What changed:    how much to take  when to take this  additional instructions   mirtazapine 7.5 MG tablet Commonly known as:  REMERON Take 7.5 mg by mouth every morning.   multivitamin tablet Take 1 tablet by mouth daily.   oral electrolytes Tabs tablet Take 1 tablet by mouth daily.   pantoprazole 40 MG tablet Commonly known as:  PROTONIX Take 40 mg by mouth every morning.   Potassium Chloride ER 20 MEQ Tbcr Take 40 mEq by mouth daily. What changed:    medication strength  how much to take   predniSONE 20 MG tablet Commonly known as:  DELTASONE Take 3 tablets (60 mg total) by mouth daily with breakfast for 3 days, THEN 2 tablets (40 mg total) daily with breakfast for 3 days, THEN 1 tablet (20 mg total) daily with breakfast  for 3 days, THEN 0.5 tablets (10 mg total) daily with breakfast for 21 days. And decrease by one tablet every 2 days. Start taking on:  September 03, 2018 What changed:    medication strength  See the new instructions.   rosuvastatin 40 MG tablet Commonly known as:  CRESTOR Take 40 mg by mouth daily after supper.   SPIRIVA RESPIMAT 2.5 MCG/ACT Aers Generic drug:  Tiotropium Bromide Monohydrate Inhale 2 puffs into the lungs every morning. What changed:  Another medication with the same name was removed. Continue taking this medication, and follow the directions you see here.            Durable Medical Equipment  (From admission, onward)         Start     Ordered   09/03/18 0952  For home use only DME Hospital bed  Once    Question Answer Comment  Patient has (list medical condition): COPD   The above medical condition requires: Patient requires the ability to reposition frequently   Head must be elevated greater than: 30 degrees   Bed type Semi-electric      09/03/18 0952         Follow-up Information    Glenda Chroman, MD Follow up.   Specialty:  Internal Medicine Contact information: [place] [address] [phone]        Sinda Du, MD. Schedule an appointment as soon as possible for a visit.   Specialty:  Pulmonary Disease Contact information: [address]  [place] [address] [phone]          Allergies  Allergen Reactions  . Oxycodone     nausea  . Codeine Palpitations    Consultations:  Pulmonology, Dr. Luan Pulling  Procedures/Studies: Dg Chest 2 View  Result Date: 08/30/2018 CLINICAL DATA:  Shortness of breath, noisy respiration. History of COPD, peripheral vascular disease, previous MI, former smoker. EXAM: CHEST - 2 VIEW COMPARISON:  Chest x-ray of August 10, 2018 and chest CT scan of August 15, 2018. FINDINGS: The lungs remain mildly hyperinflated. The interstitial markings remain increased. The heart and  pulmonary vascularity are normal. Subtle nodular density inferior to the anterior tip of the left second rib is again demonstrated. A previously described mass in the left lower lobe is not clearly visible. The bony thorax exhibits no acute abnormality. IMPRESSION: COPD. Chronic interstitial prominence may reflect fibrosis. No overt CHF or alveolar pneumonia. A known left lower lobe demonstrated on the August 15, 2018 chest CT scan mass is not clearly evident on this study. Electronically Signed   By: Shanon Brow  [place] M.D.   On: 08/30/2018 08:31   Dg Chest 2 View  Result Date: 08/10/2018 CLINICAL DATA:  Worsening dyspnea times 2-3 days EXAM: CHEST - 2 VIEW COMPARISON:  Chest CT 03/23/2018 and PET-CT 06/02/2017. FINDINGS: Emphysematous hyperinflation of the lungs consistent with COPD with subpleural areas of fibrosis at the lung bases bilaterally. Ovoid nodular 13 x 21 mm density projecting over the left upper lobe is again noted, previously noted to be FDG negative on PET CT and may reflect an area parenchymal consolidation or scarring. No effusion or pulmonary edema. No aggressive osseous lesions. 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 Redemonstration of ovoid nodular density in the left upper lobe noted to be FDG negative on prior PET-CT from 06/02/2017 Electronically Signed   By: Ashley Royalty M.D.   On: 08/10/2018 17:53   Ct Chest W Contrast  Result Date: 08/15/2018 CLINICAL DATA:  Coughing and chest tightness.  Shortness of breath. EXAM: CT CHEST WITH CONTRAST TECHNIQUE: Multidetector CT imaging of the chest was performed during intravenous contrast administration. CONTRAST:  31mL ISOVUE-300 IOPAMIDOL (ISOVUE-300) INJECTION 61% COMPARISON:  03/23/2018 CT scan FINDINGS: Cardiovascular: Coronary, aortic arch, and branch vessel atherosclerotic vascular disease. Suspected right coronary artery stent. Left anterior descending coronary artery atherosclerotic calcification.  Mediastinum/Nodes: Subcarinal node 0.9 cm in short axis on image 76/4, roughly stable from 03/23/2018. Lungs/Pleura: Primarily in the left lower lobe but tracking along the minor fissure, a 3.0 by 2.8 cm mass is present on image 67/4 and appears to have mild heterogeneous enhancement. This abuts the posterior margin of the left pulmonary artery and also the margin of the descending thoracic aorta. This measured about 1.2 by 0.9 cm on 03/23/2018, and accordingly unlike the other scattered pulmonary nodules this 1 has significantly progressed in a potentially aggressive manner. Other pulmonary nodules including the bandlike partially calcified left upper lobe nodule measuring 2.0 by 1.0 cm; the 0.8 by 0.6 cm lingular nodule on image 106/4; and the pleuroparenchymal nodularity along the lung apices is stable. Along the right lung base there is some slight increase in size and solidity of the irregular 1.6 by 1.7 cm nodule on image 150/4, previously this had a less solid appearance and was slightly smaller. There is also some mild architectural distortion posteriorly in the right lower lobe on image 124/4 which is minimally more prominent, but not in a measurable fashion. Severe emphysema. Peripheral fibrosis favoring the lung bases compatible with early honeycombing and end-stage lung disease. Upper Abdomen: Mild right adrenal nodularity is stable. Severe atrophy of the visualized part of the right kidney with some compensatory hypertrophy of the left. Musculoskeletal: Unremarkable IMPRESSION: 1. Left lower lobe retrohilar mass highly suspicious for malignant lung cancer on image 67/2. The patient has had other pulmonary nodules which have been negative on PET-CT and which have not significantly progressed. However, this particular nodule has considerably increased in size compared to the 03/23/2018 exam and is highly suspicious for lung cancer. 2. There is also some increase in size and solidity of the right basilar  nodule on image 150/4 compared to the prior exam and compared to the prior PET-CT. Multifocal lung cancer is a distinct possibility. 3. Other imaging findings of potential clinical significance: Aortic Atherosclerosis (ICD10-I70.0). Coronary atherosclerosis. Emphysema (ICD10-J43.9). Peripheral fibrosis in the lung bases with honeycombing. Atrophic right kidney. Electronically Signed   By: Van Clines M.D.   On: 08/15/2018 22:29    Subjective: Feeling much better than admission. Breathing more near his baseline.  No wheezing in past 24 hours. No chest pain. No increased leg swelling.  Discharge Exam: Vitals:   09/03/18 0747 09/03/18 0752  BP:    Pulse:    Resp:    Temp:    SpO2: 99% 100%   General: Pt is alert, awake, not in acute distress Cardiovascular: RRR, S1/S2 +, no rubs, no gallops Respiratory: Nonlabored with supplemental oxygen, diminished diffusely without wheezes or crackles Abdominal: Soft, NT, ND, bowel sounds + Extremities: Trace R > L PE pitting edema, no cyanosis  Labs: BNP (last 3 results) Recent Labs    08/30/18 0748  BNP 889.1*   Basic Metabolic Panel: Recent Labs  Lab 08/30/18 0748 08/31/18 0509 09/01/18 0510 09/02/18 0433 09/03/18 0503  NA 125* 129* 133* 134* 132*  K 2.6* 2.5* 2.3* 3.4* 3.7  CL 66* 72* 77* 83* 86*  CO2 43* 45* 45* 41* 40*  GLUCOSE 133* 194* 171* 137* 85  BUN 8 10 9 12 10   CREATININE 0.72 0.65 0.59* 0.58* 0.53*  CALCIUM 6.9* 6.7* 7.0* 7.7* 7.5*  MG  --  1.4* 1.7 1.9 1.7  PHOS  --   --  2.4*  --   --    CBC: Recent Labs  Lab 08/30/18 0748 08/31/18 0509  WBC 9.9 7.9  HGB 12.6* 10.5*  HCT 36.6* 30.3*  MCV 96.6 95.0  PLT 234 196   Cardiac Enzymes: Recent Labs  Lab 08/30/18 0748 08/30/18 1729 08/31/18 0018  TROPONINI <0.03 <0.03 <0.03    Time coordinating discharge: Approximately 40 minutes  Patrecia Pour, MD  [facility] 09/03/2018, 12:35 PM Pager [phone]

## 2018-09-03 NOTE — Care Management Important Message (Signed)
Important Message  Patient Details  Name: JOZIAH DOLLINS MRN: [id number] Date of Birth: Feb 16, 1946   Medicare Important Message Given:  Yes    [name] 09/03/2018, 1:52 PM

## 2018-09-03 NOTE — Progress Notes (Signed)
Received call from central tele stating that patient had brief run of HR in the 160s this am. Currently HR is in the low 100s. Patient resting, states comfortable. Notified MD. Donavan Foil, RN

## 2018-09-03 NOTE — Care Management (Signed)
Patient requires frequent re-positioning of the body in ways that cannot be achieved with an ordinary bed or wedge pillow, to eliminate pain, reduce pressure, and the head of the bed to be elevated more than 30 degrees most of the time due to COPD

## 2018-09-03 NOTE — Care Management (Signed)
Patient Information   Patient Name Vincent Gilbert, Vincent Gilbert ([id number]) Sex Male DOB [birth year]  Room Bed  A318 A318-01  Patient Demographics   Address [address] [address] Phone [phone] (Home) *[phone] (Mobile)  Patient Ethnicity & Race   Ethnic Group Patient Race  Not Hispanic or Latino White or Caucasian  Emergency Contact(s)   Name Relation Home Work Mobile  [name],[name] Daughter [phone]  [name]   [phone]  Documents on File    Status Date Received Description  Documents for the Patient  EMR Patient Summary Not Received    [place] Not Received    [facility] E-Signature HIPAA Notice of Privacy Received 09/10/11   [place] E-Signature HIPAA Notice of Privacy Spanish Not Received    Driver's License Not Received    Insurance Card Not Received    Advance Directives/Living Will/HCPOA/POA Not Received    Editor, commissioning Not Received    Insurance Card Not Received    Insurance Card Received 04/11/14 MEDICARE & MUTUAL OF [employer] Card Received    Release of Information Not Received  AUTH ABD AORTA 10-26-56  [facility] Policy for Pain - E Signature Not Received    AMB Intake Forms/Questionnaires Not Received    AMB Intake Forms/Questionnaires Not Received    AMB Patient Logs/Info  02/24/13   HIM ROI Authorization (Expired) 12/19/13 Department of [place] Authorization  12/19/13   Insurance Card     HIM ROI Authorization (Expired) 05/02/14 Department of [place] Authorization  05/11/14 Department of [place] [facility] Record  08/12/13 D/S [facility]  AMB Outside Hospital Record  08/10/13 H&P [facility] Record  08/10/13 PROGRESS NOTE [facility]  AMB Correspondence  04/11/14 EMAIL  AMB Provider Completed Forms  05/02/14 DISABILITY  [facility]  Other Photo ID Not Received    Concealed Handgun Permits  09/98/33   Insurance Card Received 07/10/15 [facility]  HIM ROI Authorization  11/08/15 PT AUTH/DISABILITY QUESTIONNAIRE  Insurance Card     AMB Provider Completed Forms  12/04/15 DISABILITY BENEFITS QUESTIONNAIRE [facility]  HIM ROI Authorization  02/01/16 PATIENT REQUESTED STRESS TEST SIGNED FORM  Insurance Card   MCR/MOO/UHC 06/2016  AMB Outside Hospital Record  05/04/17 D/S REPORT [place] Card Received 07/20/17 new medicare  HIM ROI Authorization  04/12/18 [place]  HIM ROI Authorization  08/26/18 [facility] [place]  HIM ROI Authorization  08/26/18 [facility] AT [place]  HIM ROI Authorization  82/50/53 pelican [place]  AMB Outside Hospital Record (Deleted) 05/04/17 D/S REPORT INC [facility] HEALTH CARE  HIM Release of Information Output (Deleted) 04/12/18 Requested records  HIM Release of Information Output (Deleted) 08/26/18 Requested records  HIM Release of Information Output  08/26/18 Requested records  HIM Release of Information Output (Deleted) 08/26/18 Requested records  HIM Release of Information Output (Deleted) 08/26/18 Requested records  Documents for the Encounter  AOB (Assignment of Insurance Benefits) Not Received    E-signature AOB Signed 08/30/18   MEDICARE RIGHTS Not Received    E-signature Medicare Rights Signed 08/30/18   ED Patient Billing Extract   ED PB Billing Extract  Cardiac Monitoring Strip Received 08/30/18   Cardiac Monitoring Strip Shift Summary Received 08/30/18   Admission Information   Current Information   Attending Provider Admitting Provider Admission Type Admission Status  Bonner Puna,  Meredith Leeds, MD Orson Eva, MD Emergency Admission (Confirmed)       Admission Date/Time Discharge Date Hospital Service Auth/Cert Status  63/14/97 07:20 AM  Internal Medicine Incomplete       Hospital Area Unit Room/Bed   [facility] AP-DEPT 300 A318/A318-01        Admission   Complaint  .  Hospital Account   Name Acct ID Class Status Primary Coverage  Abdishakur, [place] [id number] Inpatient Open MEDICARE - MEDICARE PART A AND B      Guarantor Account (for Hospital Account [id number])   Name Relation to Pt Service Area Active? Acct Type  Curt Bears Self CHSA Yes Personal/Family  Address Phone    [place],  [address] [phone])        Coverage Information (for Hospital Account [id number])   1. [place] PART A AND B   F/O Payor/Plan Precert #  MEDICARE/MEDICARE PART A AND B   Subscriber Subscriber #  Vic, Esc[place] [id number]  Address Phone  [address], [place] [address]   2. UNITED HEALTHCARE/UNITED HEALTHCARE OTHER   F/O Payor/Plan Precert #  [facility] OTHER   Subscriber Subscriber #  [place], [place] [id number]  [place]  [address] [place] [address] [phone]       Care Everywhere ID:  [id number]    Height: 5:9" Wt 60 kg

## 2018-09-03 NOTE — Progress Notes (Signed)
Patient has orders for continuos pulse ox but had not wanted to wear pulse ox monitor per night shift RN report. Maintaining O2 sats in the upper 90s-100% on 3 lpm via [place]. Notified Dr. Bonner Puna this am. Donavan Foil, RN

## 2018-09-03 NOTE — Care Management Note (Signed)
Case Management Note  Patient Details  Name: Vincent Gilbert MRN: [id number] Date of Birth: 1946/08/19  Action/Plan: DC home today. [facility] spoken with daughter and will make visit to patient next week to discuss services. CM will route DC summary once available. Pt needs hospital bed and has chosen [facility] from list provided. Bed does not need to be delivered for DC. Referral routed to [facility] DME department.   Expected Discharge Date:  09/02/18               Expected Discharge Plan:  [address] Referral:  NA  Discharge planning Services  CM Consult  Post Acute Care Choice:  Durable Medical Equipment Choice offered to:  Patient  DME Arranged:  Hospital bed DME Agency:  [facility]  Status of Service:  Completed, signed off  Sherald Barge, RN 09/03/2018, 12:24 PM

## 2018-09-06 ENCOUNTER — Other Ambulatory Visit: Payer: Self-pay | Admitting: *Deleted

## 2018-09-06 NOTE — Patient Outreach (Addendum)
Pt hospitalized 12/9-12/13/19 for COPD exacerbation, RLL nodule per CT scan and to follow up with PET scan, Diagnosis include, HTN, CAD, severe protein calorie malnutrition, alcohol dependence. Per discharge instructions, pt referred to [facility].  Outreach call to pt for transition of care week 1, spoke with pt, HIPAA verified, pt reports hospice saw him over the weekend and " we didn't like what they had to say so I'm not going that route"  Pt reports he is taking remeron to "help with my appetite"  Pt reports appetite is fair, no change, pt states he did receive hospital bed and has all medications and taking as prescribed,  Pt states he is making post hospital follow up appointments and to have bloodwork BMP completed.  Patient's daughter assists pt with all aspects of care, transportation, etc. Pt agreeable to weekly transition of care calls.  Medications Reviewed Today    Reviewed by Kassie Mends, RN (Registered Nurse) on 09/06/18 at 1049  Med List Status: <None>  Medication Order Taking? Sig Documenting Provider Last Dose Status Informant  amLODipine (NORVASC) 10 MG tablet [id number] Yes Take 10 mg by mouth daily. [provider] Taking Active Self  aspirin EC 81 MG tablet [id number] Yes Take 81 mg by mouth every morning.  [provider] Taking Active Self  budesonide-formoterol (SYMBICORT) 160-4.5 MCG/ACT inhaler [id number] Yes Inhale 2 puffs into the lungs 2 (two) times daily. [provider] Taking Active Self  cetirizine (ZYRTEC) 10 MG tablet [id number] Yes Take 10 mg by mouth daily. [provider] Taking Active Self  feeding supplement, ENSURE ENLIVE, (ENSURE ENLIVE) LIQD [id number] Yes Take 237 mLs by mouth 2 (two) times daily between meals. Orson Eva, MD Taking Active Self  fluticasone Asencion Islam) 50 MCG/ACT nasal spray [id number] Yes Place 2 sprays into both nostrils daily. [provider] Taking Active Self           Med Note  Georgiann Cocker, HEATHER L   Mon Jan 01, 2015  9:19 AM)     furosemide (LASIX) 20 MG tablet [id number] Yes Take 1 tablet (20 mg total) by mouth daily. Patrecia Pour, MD Taking Active   guaiFENesin (MUCINEX) 600 MG 12 hr tablet [id number]  Take 600 mg by mouth 2 (two) times daily.  [provider]  Active Self  ipratropium-albuterol (DUONEB) 0.5-2.5 (3) MG/3ML SOLN [id number]  Take 3 mLs by nebulization every 6 (six) hours as needed (shortness of breath and wheeze). Patrecia Pour, MD  Active   lisinopril (PRINIVIL,ZESTRIL) 40 MG tablet [id number]  Take 1 tablet (40 mg total) by mouth daily. Arnoldo Lenis, MD  Active Self  LORazepam (ATIVAN) 0.5 MG tablet [id number] Yes Take 1 tablet (0.5 mg total) by mouth every 6 (six) hours as needed for anxiety. Orson Eva, MD Taking Active Self  magnesium oxide (MAG-OX) 400 (241.3 Mg) MG tablet [id number] Yes Take 1 tablet (400 mg total) by mouth 2 (two) times daily. Orson Eva, MD Taking Active Self  metoprolol (LOPRESSOR) 100 MG tablet [id number] Yes TAKE 1 TABLET (100 MG TOTAL) BY MOUTH 2 (TWO) TIMES DAILY.  Patient taking differently:  Take 50-100 mg by mouth See admin instructions. Take 150mg  every morning and 50mg  every night   Arnoldo Lenis, MD Taking Active Self  mirtazapine (REMERON) 7.5 MG tablet [id number]  Take 7.5 mg by mouth every morning.  [provider]  Active Self  Multiple Vitamin (MULTIVITAMIN) tablet [id number] Yes Take 1 tablet by mouth daily. [provider] Taking Active Self  oral electrolytes (THERMOTABS) TABS tablet [id number]  Take 1 tablet by mouth daily. [provider]  Active Self  pantoprazole (PROTONIX) 40 MG tablet [id number]  Take 40 mg by mouth every morning.  [provider]  Active Self  potassium chloride 20 MEQ TBCR [id number]  Take 40 mEq by mouth daily. Patrecia Pour, MD  Active   predniSONE (DELTASONE) 20 MG tablet [id number] Yes Take 3 tablets (60 mg total) by mouth daily with  breakfast for 3 days, THEN 2 tablets (40 mg total) daily with breakfast for 3 days, THEN 1 tablet (20 mg total) daily with breakfast for 3 days, THEN 0.5 tablets (10 mg total) daily with breakfast for 21 days. And decrease by one tablet every 2 days. Patrecia Pour, MD Taking Active   rosuvastatin (CRESTOR) 40 MG tablet [id number]  Take 40 mg by mouth daily after supper. [provider]  Active Self  Tiotropium Bromide Monohydrate (SPIRIVA RESPIMAT) 2.5 MCG/ACT AERS [id number]  Inhale 2 puffs into the lungs every morning. [provider]  Active Self          [facility] CM Care Plan Problem One     Most Recent Value  Care Plan Problem One  Knowledge deficit related to COPD  Role Documenting the Problem One  Care Management Coordinator  Care Plan for Problem One  Active  [facility] Long Term Goal   Pt will demonstrate improved self care within 60 days  [facility] Long Term Goal Start Date  08/18/18  Interventions for Problem One Long Term Goal  RN CM reviewed discharge instructions, reviewed medication, pt is getting prednisone filled today, states he had some left that he took over the weekend  [facility] CM Short Term Goal #1   pt will verbalize COPD action plan/ zones within 30 days  [facility] CM Short Term Goal #1 Start Date  08/18/18  Interventions for Short Term Goal #1  RN CM reviewed COPD action plan/ with empahsis on yellow zone, importance of calling MD early for change in health status  [facility] CM Short Term Goal #2   Alternation in nutrition- less than body requirements  [facility] CM Short Term Goal #2 Start Date  08/18/18  Interventions for Short Term Goal #2  RN CM reviewed importance of continuing to drink ensure which pt states he is doing 1-2 daily, ask pt to eat 3 meals daily plus snacks with adequate protein, carbohydrates, fat.       PLAN Continue weekly transition of care calls  Jacqlyn Larsen [facility], [place] Coordinator [phone]

## 2018-09-07 ENCOUNTER — Other Ambulatory Visit: Payer: Self-pay | Admitting: *Deleted

## 2018-09-07 NOTE — Patient Outreach (Signed)
Red EMMI flags for 09/06/18, pt has medications not filled and no follow up appointment made,  RN CM called pt with no answer to telephone and no option to leave voicemail.  RN CM talked with pt on 09/06/18 for transition of care and pt stated his daughter was getting the prednisone prescription picked up on 09/06/18 and pt/ daughter will be making post hospital follow up appointment.    Jacqlyn Larsen [facility], [place] Coordinator [phone]

## 2018-09-10 ENCOUNTER — Other Ambulatory Visit: Payer: Self-pay | Admitting: *Deleted

## 2018-09-10 NOTE — Patient Outreach (Signed)
Outreach call to pt for follow up on Red EMMI 09/09/18 for sad, hopeless, anxious, spoke with pt, HIPAA verified, pt reports he is not sad, hopeless, but does have anxiety and medication is helping, pt states " anxiety no worse than usual"  RN CM ask pt about [facility] out of [facility], pt states " I would like to wait on this before I decide"  Pt to have PET scan next week.  PLAN Continue weekly transition of care calls  Jacqlyn Larsen [facility], [place] Coordinator [phone]

## 2018-09-10 NOTE — Patient Outreach (Signed)
Patient triggered Red on [place] Discharge Dashboard, notification sent to:  Jacqlyn Larsen, RN

## 2018-09-13 ENCOUNTER — Ambulatory Visit (HOSPITAL_COMMUNITY)
Admission: RE | Admit: 2018-09-13 | Discharge: 2018-09-13 | Disposition: A | Discharge status: Home / Self Care | Payer: Medicare Other | Source: Ambulatory Visit | Attending: Pulmonary Disease | Admitting: Pulmonary Disease

## 2018-09-13 DIAGNOSIS — R918 Other nonspecific abnormal finding of lung field: Secondary | ICD-10-CM | POA: Insufficient documentation

## 2018-09-13 MED ORDER — FLUDEOXYGLUCOSE F - 18 (FDG) INJECTION
9.5000 | Freq: Once | INTRAVENOUS | Status: AC | PRN
  Administered 2018-09-13: 9.5 via INTRAVENOUS

## 2018-09-14 ENCOUNTER — Other Ambulatory Visit: Payer: Self-pay | Admitting: *Deleted

## 2018-09-14 NOTE — Patient Outreach (Signed)
Transition of care call week 2. Talked with Vincent Gilbert. He had his CT yesterday. He has not been advised of the results of this. His doctor knows to call him after the holidays. Pt reports he is no more SOB than usual. He has his O2 to use as needed and nebulizer treatments. He denies any needs for me to address. I have advised his primary care manager will be calling him next week.  [name]. Myrtie Neither, MSN, [facility] Gerontological Nurse Practitioner [facility] Care Management [phone]

## 2018-09-20 ENCOUNTER — Other Ambulatory Visit: Payer: Self-pay

## 2018-09-20 ENCOUNTER — Inpatient Hospital Stay (HOSPITAL_COMMUNITY)
Admission: EM | Admit: 2018-09-20 | Discharge: 2018-09-23 | DRG: 189 | Disposition: A | Discharge status: Hospice | Payer: Medicare Other | Attending: Family Medicine | Admitting: Family Medicine

## 2018-09-20 ENCOUNTER — Other Ambulatory Visit: Payer: Self-pay | Admitting: *Deleted

## 2018-09-20 ENCOUNTER — Emergency Department (HOSPITAL_COMMUNITY): Payer: Medicare Other

## 2018-09-20 ENCOUNTER — Encounter (HOSPITAL_COMMUNITY): Payer: Self-pay

## 2018-09-20 DIAGNOSIS — E860 Dehydration: Secondary | ICD-10-CM | POA: Diagnosis present

## 2018-09-20 DIAGNOSIS — Z7189 Other specified counseling: Secondary | ICD-10-CM | POA: Diagnosis not present

## 2018-09-20 DIAGNOSIS — E43 Unspecified severe protein-calorie malnutrition: Secondary | ICD-10-CM | POA: Diagnosis present

## 2018-09-20 DIAGNOSIS — Z515 Encounter for palliative care: Secondary | ICD-10-CM | POA: Diagnosis present

## 2018-09-20 DIAGNOSIS — Z9981 Dependence on supplemental oxygen: Secondary | ICD-10-CM

## 2018-09-20 DIAGNOSIS — R531 Weakness: Secondary | ICD-10-CM | POA: Diagnosis not present

## 2018-09-20 DIAGNOSIS — K219 Gastro-esophageal reflux disease without esophagitis: Secondary | ICD-10-CM | POA: Diagnosis present

## 2018-09-20 DIAGNOSIS — J984 Other disorders of lung: Secondary | ICD-10-CM | POA: Diagnosis not present

## 2018-09-20 DIAGNOSIS — E222 Syndrome of inappropriate secretion of antidiuretic hormone: Secondary | ICD-10-CM | POA: Diagnosis present

## 2018-09-20 DIAGNOSIS — I10 Essential (primary) hypertension: Secondary | ICD-10-CM | POA: Diagnosis not present

## 2018-09-20 DIAGNOSIS — I252 Old myocardial infarction: Secondary | ICD-10-CM | POA: Diagnosis not present

## 2018-09-20 DIAGNOSIS — Z7401 Bed confinement status: Secondary | ICD-10-CM | POA: Diagnosis not present

## 2018-09-20 DIAGNOSIS — I2510 Atherosclerotic heart disease of native coronary artery without angina pectoris: Secondary | ICD-10-CM | POA: Diagnosis not present

## 2018-09-20 DIAGNOSIS — I110 Hypertensive heart disease with heart failure: Secondary | ICD-10-CM | POA: Diagnosis present

## 2018-09-20 DIAGNOSIS — Z66 Do not resuscitate: Secondary | ICD-10-CM | POA: Diagnosis present

## 2018-09-20 DIAGNOSIS — Z8673 Personal history of transient ischemic attack (TIA), and cerebral infarction without residual deficits: Secondary | ICD-10-CM

## 2018-09-20 DIAGNOSIS — Z8249 Family history of ischemic heart disease and other diseases of the circulatory system: Secondary | ICD-10-CM

## 2018-09-20 DIAGNOSIS — R05 Cough: Secondary | ICD-10-CM | POA: Diagnosis not present

## 2018-09-20 DIAGNOSIS — F329 Major depressive disorder, single episode, unspecified: Secondary | ICD-10-CM | POA: Diagnosis present

## 2018-09-20 DIAGNOSIS — Z7952 Long term (current) use of systemic steroids: Secondary | ICD-10-CM

## 2018-09-20 DIAGNOSIS — E871 Hypo-osmolality and hyponatremia: Secondary | ICD-10-CM | POA: Diagnosis not present

## 2018-09-20 DIAGNOSIS — R918 Other nonspecific abnormal finding of lung field: Secondary | ICD-10-CM | POA: Diagnosis present

## 2018-09-20 DIAGNOSIS — Z7951 Long term (current) use of inhaled steroids: Secondary | ICD-10-CM | POA: Diagnosis not present

## 2018-09-20 DIAGNOSIS — I4891 Unspecified atrial fibrillation: Secondary | ICD-10-CM | POA: Diagnosis present

## 2018-09-20 DIAGNOSIS — I5032 Chronic diastolic (congestive) heart failure: Secondary | ICD-10-CM | POA: Diagnosis present

## 2018-09-20 DIAGNOSIS — Z7982 Long term (current) use of aspirin: Secondary | ICD-10-CM

## 2018-09-20 DIAGNOSIS — J441 Chronic obstructive pulmonary disease with (acute) exacerbation: Secondary | ICD-10-CM | POA: Diagnosis not present

## 2018-09-20 DIAGNOSIS — J432 Centrilobular emphysema: Secondary | ICD-10-CM | POA: Diagnosis present

## 2018-09-20 DIAGNOSIS — Z955 Presence of coronary angioplasty implant and graft: Secondary | ICD-10-CM

## 2018-09-20 DIAGNOSIS — Z77090 Contact with and (suspected) exposure to asbestos: Secondary | ICD-10-CM | POA: Diagnosis present

## 2018-09-20 DIAGNOSIS — Z87891 Personal history of nicotine dependence: Secondary | ICD-10-CM | POA: Diagnosis not present

## 2018-09-20 DIAGNOSIS — J9621 Acute and chronic respiratory failure with hypoxia: Principal | ICD-10-CM | POA: Diagnosis present

## 2018-09-20 DIAGNOSIS — R0602 Shortness of breath: Secondary | ICD-10-CM | POA: Diagnosis not present

## 2018-09-20 DIAGNOSIS — I959 Hypotension, unspecified: Secondary | ICD-10-CM | POA: Diagnosis present

## 2018-09-20 DIAGNOSIS — Z79899 Other long term (current) drug therapy: Secondary | ICD-10-CM

## 2018-09-20 DIAGNOSIS — Z801 Family history of malignant neoplasm of trachea, bronchus and lung: Secondary | ICD-10-CM

## 2018-09-20 DIAGNOSIS — J449 Chronic obstructive pulmonary disease, unspecified: Secondary | ICD-10-CM | POA: Diagnosis not present

## 2018-09-20 DIAGNOSIS — Z885 Allergy status to narcotic agent status: Secondary | ICD-10-CM

## 2018-09-20 DIAGNOSIS — F1010 Alcohol abuse, uncomplicated: Secondary | ICD-10-CM | POA: Diagnosis present

## 2018-09-20 DIAGNOSIS — F32A Depression, unspecified: Secondary | ICD-10-CM

## 2018-09-20 DIAGNOSIS — I739 Peripheral vascular disease, unspecified: Secondary | ICD-10-CM | POA: Diagnosis present

## 2018-09-20 DIAGNOSIS — F17210 Nicotine dependence, cigarettes, uncomplicated: Secondary | ICD-10-CM | POA: Diagnosis present

## 2018-09-20 LAB — URINALYSIS, ROUTINE W REFLEX MICROSCOPIC
Bilirubin Urine: NEGATIVE
Glucose, UA: NEGATIVE mg/dL
HGB URINE DIPSTICK: NEGATIVE
Ketones, ur: NEGATIVE mg/dL
Leukocytes, UA: NEGATIVE
Nitrite: NEGATIVE
Protein, ur: NEGATIVE mg/dL
SPECIFIC GRAVITY, URINE: 1.004 — AB (ref 1.005–1.030)
pH: 7 (ref 5.0–8.0)

## 2018-09-20 LAB — CBC WITH DIFFERENTIAL/PLATELET
Abs Immature Granulocytes: 0.08 10*3/uL — ABNORMAL HIGH (ref 0.00–0.07)
Basophils Absolute: 0 10*3/uL (ref 0.0–0.1)
Basophils Relative: 0 %
Eosinophils Absolute: 0 10*3/uL (ref 0.0–0.5)
Eosinophils Relative: 0 %
HCT: 33.7 % — ABNORMAL LOW (ref 39.0–52.0)
HEMOGLOBIN: 11.2 g/dL — AB (ref 13.0–17.0)
Immature Granulocytes: 1 %
LYMPHS PCT: 12 %
Lymphs Abs: 1.4 10*3/uL (ref 0.7–4.0)
MCH: 32.7 pg (ref 26.0–34.0)
MCHC: 33.2 g/dL (ref 30.0–36.0)
MCV: 98.3 fL (ref 80.0–100.0)
Monocytes Absolute: 1.3 10*3/uL — ABNORMAL HIGH (ref 0.1–1.0)
Monocytes Relative: 12 %
Neutro Abs: 8.5 10*3/uL — ABNORMAL HIGH (ref 1.7–7.7)
Neutrophils Relative %: 75 %
Platelets: 261 10*3/uL (ref 150–400)
RBC: 3.43 MIL/uL — ABNORMAL LOW (ref 4.22–5.81)
RDW: 12.2 % (ref 11.5–15.5)
WBC: 11.3 10*3/uL — ABNORMAL HIGH (ref 4.0–10.5)
nRBC: 0 % (ref 0.0–0.2)

## 2018-09-20 LAB — COMPREHENSIVE METABOLIC PANEL
ALT: 17 U/L (ref 0–44)
AST: 20 U/L (ref 15–41)
Albumin: 3.2 g/dL — ABNORMAL LOW (ref 3.5–5.0)
Alkaline Phosphatase: 52 U/L (ref 38–126)
Anion gap: 8 (ref 5–15)
BUN: 7 mg/dL — ABNORMAL LOW (ref 8–23)
CO2: 38 mmol/L — ABNORMAL HIGH (ref 22–32)
CREATININE: 0.74 mg/dL (ref 0.61–1.24)
Calcium: 8.5 mg/dL — ABNORMAL LOW (ref 8.9–10.3)
Chloride: 81 mmol/L — ABNORMAL LOW (ref 98–111)
GFR calc Af Amer: >60 mL/min (ref >60)
GFR calc non Af Amer: >60 mL/min (ref >60)
Glucose, Bld: 120 mg/dL — ABNORMAL HIGH (ref 70–99)
Potassium: 4.6 mmol/L (ref 3.5–5.1)
Sodium: 127 mmol/L — ABNORMAL LOW (ref 135–145)
Total Bilirubin: 0.7 mg/dL (ref 0.3–1.2)
Total Protein: 5.9 g/dL — ABNORMAL LOW (ref 6.5–8.1)

## 2018-09-20 LAB — TROPONIN I: Troponin I: <0.03 ng/mL (ref <0.03)

## 2018-09-20 LAB — MAGNESIUM: Magnesium: 1.7 mg/dL (ref 1.7–2.4)

## 2018-09-20 LAB — GROUP A STREP BY PCR: Group A Strep by PCR: NOT DETECTED

## 2018-09-20 LAB — BRAIN NATRIURETIC PEPTIDE: B Natriuretic Peptide: 156 pg/mL — ABNORMAL HIGH (ref 0.0–100.0)

## 2018-09-20 MED ORDER — MIRTAZAPINE 15 MG PO TABS
15.0000 mg | ORAL_TABLET | Freq: Every day | ORAL | Status: DC
  Administered 2018-09-20 – 2018-09-22 (×3): 15 mg via ORAL
  Filled 2018-09-20 (×3): qty 1

## 2018-09-20 MED ORDER — ONDANSETRON HCL 4 MG PO TABS: 4.0000 mg | ORAL_TABLET | Freq: Four times a day (QID) | ORAL | Status: DC | PRN

## 2018-09-20 MED ORDER — LORAZEPAM 2 MG/ML IJ SOLN
1.0000 mg | Freq: Four times a day (QID) | INTRAMUSCULAR | Status: DC | PRN
  Administered 2018-09-22: 1 mg via INTRAVENOUS
  Filled 2018-09-20: qty 1

## 2018-09-20 MED ORDER — UMECLIDINIUM BROMIDE 62.5 MCG/INH IN AEPB
1.0000 | INHALATION_SPRAY | Freq: Every day | RESPIRATORY_TRACT | Status: DC
  Administered 2018-09-21 – 2018-09-23 (×3): 1 via RESPIRATORY_TRACT
  Filled 2018-09-20: qty 7

## 2018-09-20 MED ORDER — TIOTROPIUM BROMIDE MONOHYDRATE 2.5 MCG/ACT IN AERS: 2.0000 | INHALATION_SPRAY | Freq: Every morning | RESPIRATORY_TRACT | Status: DC

## 2018-09-20 MED ORDER — SODIUM CHLORIDE 0.9 % IV BOLUS
500.0000 mL | Freq: Once | INTRAVENOUS | Status: AC
  Administered 2018-09-20: 500 mL via INTRAVENOUS

## 2018-09-20 MED ORDER — ACETAMINOPHEN 650 MG RE SUPP: 650.0000 mg | Freq: Four times a day (QID) | RECTAL | Status: DC | PRN

## 2018-09-20 MED ORDER — METHYLPREDNISOLONE SODIUM SUCC 40 MG IJ SOLR
40.0000 mg | Freq: Two times a day (BID) | INTRAMUSCULAR | Status: DC
  Administered 2018-09-21 – 2018-09-23 (×5): 40 mg via INTRAVENOUS
  Filled 2018-09-20 (×5): qty 1

## 2018-09-20 MED ORDER — ENSURE ENLIVE PO LIQD
237.0000 mL | Freq: Two times a day (BID) | ORAL | Status: DC
  Administered 2018-09-21 – 2018-09-22 (×4): 237 mL via ORAL

## 2018-09-20 MED ORDER — LEVALBUTEROL HCL 0.63 MG/3ML IN NEBU
0.6300 mg | INHALATION_SOLUTION | Freq: Once | RESPIRATORY_TRACT | Status: AC
  Administered 2018-09-20: 0.63 mg via RESPIRATORY_TRACT
  Filled 2018-09-20: qty 3

## 2018-09-20 MED ORDER — ALBUTEROL SULFATE (2.5 MG/3ML) 0.083% IN NEBU: 2.5000 mg | INHALATION_SOLUTION | RESPIRATORY_TRACT | Status: DC | PRN

## 2018-09-20 MED ORDER — ASPIRIN EC 81 MG PO TBEC
81.0000 mg | DELAYED_RELEASE_TABLET | Freq: Every morning | ORAL | Status: DC
  Administered 2018-09-21 – 2018-09-23 (×3): 81 mg via ORAL
  Filled 2018-09-20 (×3): qty 1

## 2018-09-20 MED ORDER — METHYLPREDNISOLONE SODIUM SUCC 125 MG IJ SOLR
125.0000 mg | Freq: Once | INTRAMUSCULAR | Status: AC
  Administered 2018-09-20: 125 mg via INTRAVENOUS
  Filled 2018-09-20: qty 2

## 2018-09-20 MED ORDER — IPRATROPIUM-ALBUTEROL 0.5-2.5 (3) MG/3ML IN SOLN
3.0000 mL | Freq: Four times a day (QID) | RESPIRATORY_TRACT | Status: DC
  Administered 2018-09-20: 3 mL via RESPIRATORY_TRACT
  Filled 2018-09-20: qty 3

## 2018-09-20 MED ORDER — THIAMINE HCL 100 MG/ML IJ SOLN: 100.0000 mg | Freq: Every day | INTRAMUSCULAR | Status: DC

## 2018-09-20 MED ORDER — ALBUTEROL SULFATE (2.5 MG/3ML) 0.083% IN NEBU: 5.0000 mg | INHALATION_SOLUTION | Freq: Once | RESPIRATORY_TRACT | Status: DC

## 2018-09-20 MED ORDER — SODIUM CHLORIDE 0.9 % IV SOLN
INTRAVENOUS | Status: AC
  Administered 2018-09-20: 23:00:00 via INTRAVENOUS

## 2018-09-20 MED ORDER — ENOXAPARIN SODIUM 40 MG/0.4ML ~~LOC~~ SOLN
40.0000 mg | SUBCUTANEOUS | Status: DC
  Administered 2018-09-21 – 2018-09-22 (×2): 40 mg via SUBCUTANEOUS
  Filled 2018-09-20 (×2): qty 0.4

## 2018-09-20 MED ORDER — IOPAMIDOL (ISOVUE-370) INJECTION 76%
100.0000 mL | Freq: Once | INTRAVENOUS | Status: AC | PRN
  Administered 2018-09-20: 100 mL via INTRAVENOUS

## 2018-09-20 MED ORDER — LORAZEPAM 1 MG PO TABS
1.0000 mg | ORAL_TABLET | Freq: Four times a day (QID) | ORAL | Status: DC | PRN
  Administered 2018-09-21 – 2018-09-23 (×3): 1 mg via ORAL
  Filled 2018-09-20 (×3): qty 1

## 2018-09-20 MED ORDER — ROSUVASTATIN CALCIUM 20 MG PO TABS
40.0000 mg | ORAL_TABLET | Freq: Every day | ORAL | Status: DC
  Administered 2018-09-21 – 2018-09-22 (×2): 40 mg via ORAL
  Filled 2018-09-20 (×2): qty 2

## 2018-09-20 MED ORDER — FOLIC ACID 1 MG PO TABS
1.0000 mg | ORAL_TABLET | Freq: Every day | ORAL | Status: DC
  Administered 2018-09-21 – 2018-09-23 (×3): 1 mg via ORAL
  Filled 2018-09-20 (×3): qty 1

## 2018-09-20 MED ORDER — ACETAMINOPHEN 325 MG PO TABS: 650.0000 mg | ORAL_TABLET | Freq: Four times a day (QID) | ORAL | Status: DC | PRN

## 2018-09-20 MED ORDER — ONDANSETRON HCL 4 MG/2ML IJ SOLN: 4.0000 mg | Freq: Four times a day (QID) | INTRAMUSCULAR | Status: DC | PRN

## 2018-09-20 MED ORDER — MOMETASONE FURO-FORMOTEROL FUM 200-5 MCG/ACT IN AERO
2.0000 | INHALATION_SPRAY | Freq: Two times a day (BID) | RESPIRATORY_TRACT | Status: DC
  Administered 2018-09-22 – 2018-09-23 (×2): 2 via RESPIRATORY_TRACT
  Filled 2018-09-20: qty 8.8

## 2018-09-20 MED ORDER — GUAIFENESIN ER 600 MG PO TB12
600.0000 mg | ORAL_TABLET | Freq: Two times a day (BID) | ORAL | Status: DC
  Administered 2018-09-20 – 2018-09-23 (×6): 600 mg via ORAL
  Filled 2018-09-20 (×6): qty 1

## 2018-09-20 MED ORDER — VITAMIN B-1 100 MG PO TABS
100.0000 mg | ORAL_TABLET | Freq: Every day | ORAL | Status: DC
  Administered 2018-09-21 – 2018-09-23 (×3): 100 mg via ORAL
  Filled 2018-09-20 (×3): qty 1

## 2018-09-20 MED ORDER — ADULT MULTIVITAMIN W/MINERALS CH
1.0000 | ORAL_TABLET | Freq: Every day | ORAL | Status: DC
  Administered 2018-09-21 – 2018-09-23 (×3): 1 via ORAL
  Filled 2018-09-20 (×3): qty 1

## 2018-09-20 NOTE — H&P (Signed)
History and Physical    Vincent Gilbert DOB: Jun 06, 1946 DOA: 09/20/2018  PCP: Glenda Chroman, MD  Patient coming from: Home  I have personally briefly reviewed patient's old medical records in [place]  Chief Complaint: Dyspnea on exertion, not eating  HPI: Vincent Gilbert is a 72 y.o. 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  His daughter and POA is at bedside.  Patient reports worsened dyspnea exertion compared to baseline with shortness of breath developing after just walking a few steps.  He has cough with occasional scant white sputum production.  He says he has a depressed mood and attributes this to not eating at all (except for occasional candy) for the last week.  He says he drinks 8 to 9 twelve ounce beers per day.  He is normally on 2 L supplemental O2 at home but has to had to increase to 3 L.  Denies any fevers, chest pain, abdominal pain, dysuria.  He has had some diarrhea.  He and his daughter are not aware of the recent PET scan results and see her to discuss this in person with Dr. Luan Pulling with upcoming appointment, however have asked to be notified with the results during my examination.  ED Course:  Initial vitals showed BP 97/57, pulse 63, RR 20, temp 98.2 Fahrenheit, SPO2 97% on 4.5 L supplemental O2 via [place].  Labs are notable for WBC 11.3, sodium 127, bicarb 38 (chronically elevated), BNP 156, magnesium 1.7, troponin I <0.03, negative group A strep by PCR.  2 view chest x-ray showed hyperinflation/emphysematous disease with basilar fibrosis.  Left hilar asymmetric opacity was seen corresponding to prior known  mass.  CTA chest PE contrast was obtained which was negative for acute abnormality or PE.  An unchanged 2.7 x 3 cm left lateral hilar lung mass compatible with malignancy is seen as identified on recent PET/CT.  And unchanged 1.7 cm right lower lobe basilar nodule was also noted.  Patient was given 1 L normal saline, IV Solu-Medrol 125 mg once, and Xopenex breathing treatment.  The hospital service was consulted to admit for acute on chronic hypoxic respiratory failure.  Review of Systems: As per HPI otherwise 10 point review of systems negative.    Past Medical History:  Diagnosis Date  . Carotid artery occlusion   . COPD (chronic obstructive pulmonary disease) ([place])   . Coronary artery disease   . GERD (gastroesophageal reflux disease)   . Heart murmur   . Hypertension   . Myocardial infarction ([place]) 12/12  . Stroke [facility]) 09/10/2011    Past Surgical History:  Procedure Laterality Date  . CARDIAC CATHETERIZATION  09-15-2011   90% mid RCA stenosis. Normal EF  . CORONARY ANGIOPLASTY WITH STENT PLACEMENT  09-15-2011   mid RCA: 3.5 X32 mm Promus DES  . ENDARTERECTOMY  03/11/2012   Procedure: ENDARTERECTOMY CAROTID;  Surgeon: Mal Misty, MD;  Location: [facility];  Service: Vascular;  Laterality: Right;  . EYE SURGERY  rt  . LEFT HEART CATHETERIZATION WITH CORONARY ANGIOGRAM N/A 09/15/2011   Procedure: LEFT HEART CATHETERIZATION WITH CORONARY ANGIOGRAM;  Surgeon: Sherren Mocha, MD;  Location: [facility] CATH LAB;  Service:  Cardiovascular;  Laterality: N/A;  . PERCUTANEOUS CORONARY STENT INTERVENTION (PCI-S) N/A 09/15/2011   Procedure: PERCUTANEOUS CORONARY STENT INTERVENTION (PCI-S);  Surgeon: Sherren Mocha, MD;  Location: [facility] CATH LAB;  Service: Cardiovascular;  Laterality: N/A;  . VASECTOMY    . YAG LASER APPLICATION Right 8/54/6270   Procedure: YAG LASER APPLICATION;  Surgeon: Williams Che, MD;  Location: [facility];  Service: Ophthalmology;  Laterality: Right;     reports that he has  quit smoking. His smoking use included cigarettes. He started smoking about 62 years ago. He has a 30.00 pack-year smoking history. He quit smokeless tobacco use about 6 months ago. He reports current alcohol use of about 6.0 standard drinks of alcohol per week. He reports that he does not use drugs.  Allergies  Allergen Reactions  . Oxycodone     nausea  . Codeine Palpitations    Family History  Problem Relation Age of Onset  . Heart disease Mother   . Heart disease Father      Prior to Admission medications   Medication Sig Start Date End Date Taking? Authorizing Provider  amLODipine (NORVASC) 10 MG tablet Take 10 mg by mouth daily.   Yes [provider]  aspirin EC 81 MG tablet Take 81 mg by mouth every morning.    Yes [provider]  budesonide-formoterol (SYMBICORT) 160-4.5 MCG/ACT inhaler Inhale 2 puffs into the lungs 2 (two) times daily.   Yes [provider]  cetirizine (ZYRTEC) 10 MG tablet Take 10 mg by mouth daily.   Yes [provider]  feeding supplement, ENSURE ENLIVE, (ENSURE ENLIVE) LIQD Take 237 mLs by mouth 2 (two) times daily between meals. 03/26/18  Yes Tat, Shanon Brow, MD  fluticasone (FLONASE) 50 MCG/ACT nasal spray Place 2 sprays into both nostrils daily. 11/15/14  Yes [provider]  furosemide (LASIX) 20 MG tablet Take 1 tablet (20 mg total) by mouth daily. 09/04/18  Yes Patrecia Pour, MD  guaiFENesin (MUCINEX) 600 MG 12 hr tablet Take 600 mg by mouth 2 (two) times daily.    Yes [provider]  ipratropium-albuterol (DUONEB) 0.5-2.5 (3) MG/3ML SOLN Take 3 mLs by nebulization every 6 (six) hours as needed (shortness of breath and wheeze). 09/03/18  Yes Patrecia Pour, MD  lisinopril (PRINIVIL,ZESTRIL) 40 MG tablet Take 1 tablet (40 mg total) by mouth daily. 05/04/15  Yes Branch, Alphonse Guild, MD  LORazepam (ATIVAN) 0.5 MG tablet Take 1 tablet (0.5 mg total) by mouth every 6 (six) hours as needed for anxiety. 08/17/18   Yes Tat, Shanon Brow, MD  magnesium oxide (MAG-OX) 400 (241.3 Mg) MG tablet Take 1 tablet (400 mg total) by mouth 2 (two) times daily. 08/17/18  Yes Tat, Shanon Brow, MD  metoprolol (LOPRESSOR) 100 MG tablet TAKE 1 TABLET (100 MG TOTAL) BY MOUTH 2 (TWO) TIMES DAILY. Patient taking differently: Take 50-100 mg by mouth See admin instructions. Take 150mg  every morning and 50mg  every night 07/28/16  Yes Branch, Alphonse Guild, MD  mirtazapine (REMERON) 7.5 MG tablet Take 7.5 mg by mouth every morning.  03/08/18  Yes [provider]  Multiple Vitamin (MULTIVITAMIN) tablet Take 1 tablet by mouth daily.   Yes [provider]  pantoprazole (PROTONIX) 40 MG tablet Take 40 mg by mouth every morning.    Yes [provider]  potassium chloride 20 MEQ TBCR Take 40 mEq by mouth daily. 09/03/18  Yes Patrecia Pour, MD  predniSONE (DELTASONE) 20 MG tablet Take 3 tablets (60 mg total)  by mouth daily with breakfast for 3 days, THEN 2 tablets (40 mg total) daily with breakfast for 3 days, THEN 1 tablet (20 mg total) daily with breakfast for 3 days, THEN 0.5 tablets (10 mg total) daily with breakfast for 21 days. And decrease by one tablet every 2 days. 09/03/18 10/03/18 Yes Patrecia Pour, MD  rosuvastatin (CRESTOR) 40 MG tablet Take 40 mg by mouth daily after supper.   Yes [provider]  Tiotropium Bromide Monohydrate (SPIRIVA RESPIMAT) 2.5 MCG/ACT AERS Inhale 2 puffs into the lungs every morning.   Yes [provider]  oral electrolytes (THERMOTABS) TABS tablet Take 1 tablet by mouth daily.    [provider]    Physical Exam: Vitals:   09/20/18 1635 09/20/18 1730 09/20/18 1800 09/20/18 1946  BP: (!) 100/55 131/69 109/60   Pulse: 72 83 75   Resp: 20 17 17    Temp:      TempSrc:      SpO2: 100% 98% 100% 99%  Weight:      Height:        Constitutional: Chronically ill-appearing man lying supine in bed Eyes: PERRL, lids and conjunctivae normal ENMT: Mucous membranes are  dry. Posterior pharynx clear of any exudate or lesions.Normal dentition.  Neck: normal, supple, no masses. Respiratory: Distant breath sounds, no wheezing, no crackles. Normal respiratory effort. No accessory muscle use.  Cardiovascular: Regular rate and rhythm, no murmurs / rubs / gallops. No extremity edema. Abdomen: no tenderness, no masses palpated. No hepatosplenomegaly. Bowel sounds positive.  Musculoskeletal: no clubbing / cyanosis. No joint deformity upper and lower extremities. Good ROM, no contractures. Normal muscle tone.  Skin: no rashes, lesions, ulcers. No induration Neurologic: CN 2-12 grossly intact. Sensation intact, Strength intact.  Psychiatric: Depressed mood. Alert and oriented x 3. Normal mood.     Labs on Admission: I have personally reviewed following labs and imaging studies  CBC: Recent Labs  Lab 09/20/18 1418  WBC 11.3*  NEUTROABS 8.5*  HGB 11.2*  HCT 33.7*  MCV 98.3  PLT 254   Basic Metabolic Panel: Recent Labs  Lab 09/20/18 1418  NA 127*  K 4.6  CL 81*  CO2 38*  GLUCOSE 120*  BUN 7*  CREATININE 0.74  CALCIUM 8.5*  MG 1.7   GFR: Estimated Creatinine Clearance: 66.1 mL/min (by C-G formula based on SCr of 0.74 mg/dL). Liver Function Tests: Recent Labs  Lab 09/20/18 1418  AST 20  ALT 17  ALKPHOS 52  BILITOT 0.7  PROT 5.9*  ALBUMIN 3.2*   No results for input(s): LIPASE, AMYLASE in the last 168 hours. No results for input(s): AMMONIA in the last 168 hours. Coagulation Profile: No results for input(s): INR, PROTIME in the last 168 hours. Cardiac Enzymes: Recent Labs  Lab 09/20/18 1418  TROPONINI <0.03   BNP (last 3 results) No results for input(s): PROBNP in the last 8760 hours. HbA1C: No results for input(s): HGBA1C in the last 72 hours. CBG: No results for input(s): GLUCAP in the last 168 hours. Lipid Profile: No results for input(s): CHOL, HDL, LDLCALC, TRIG, CHOLHDL, LDLDIRECT in the last 72 hours. Thyroid Function  Tests: No results for input(s): TSH, T4TOTAL, FREET4, T3FREE, THYROIDAB in the last 72 hours. Anemia Panel: No results for input(s): VITAMINB12, FOLATE, FERRITIN, TIBC, IRON, RETICCTPCT in the last 72 hours. Urine analysis:    Component Value Date/Time   COLORURINE STRAW (A) 09/20/2018 1551   APPEARANCEUR CLEAR 09/20/2018 1551   LABSPEC 1.004 (L) 09/20/2018  [place] 7.0 09/20/2018 [place] 09/20/2018 [place] 09/20/2018 [place] 09/20/2018 [place] 09/20/2018 [place] 09/20/2018 1551   UROBILINOGEN 1.0 03/10/2012 1312   NITRITE NEGATIVE 09/20/2018 [place] 09/20/2018 1551    Radiological Exams on Admission: Dg Chest 2 View  Result Date: 09/20/2018 CLINICAL DATA:  Cough with generalized weakness EXAM: CHEST - 2 VIEW COMPARISON:  08/30/2018, CT 08/15/2018, PET-CT 06/02/2017, radiograph 04/28/2017 FINDINGS: Hyperinflation with emphysematous disease. Stable left upper lobe 17 mm ovoid nodule. No acute airspace disease or pleural effusion. Stable cardiomediastinal silhouette. No pneumothorax. Reticular opacity at the bases consistent with scarring and fibrosis. Left lower lobe retro hilar mass presumably contributing to increased hilar density. IMPRESSION: 1. Hyperinflation with emphysematous disease and basilar fibrosis. 2. Asymmetric opacity in the left hilar region presumably corresponds to known left retro hilar mass. 3. Stable left upper lobe ovoid pulmonary nodule. Electronically Signed   By: Donavan Foil M.D.   On: 09/20/2018 14:36   Ct Angio Chest Pe W And/or Wo Contrast  Result Date: 09/20/2018 CLINICAL DATA:  72 year old male with weakness, shortness of breath, hypoxia and pain. EXAM: CT ANGIOGRAPHY CHEST WITH CONTRAST TECHNIQUE: Multidetector CT imaging of the chest was performed using the standard protocol during bolus administration of intravenous contrast. Multiplanar CT  image reconstructions and MIPs were obtained to evaluate the vascular anatomy. CONTRAST:  179mL ISOVUE-370 IOPAMIDOL (ISOVUE-370) INJECTION 76% COMPARISON:  08/15/2018 and prior CTs. 09/20/2018 and prior chest radiographs. 09/13/2018 and 06/02/2017 PET CT FINDINGS: Cardiovascular: This is a technically satisfactory study. No pulmonary emboli are identified. Heart size normal. Coronary artery and aortic atherosclerotic calcifications are present. No thoracic aortic aneurysm or pericardial effusion. Mediastinum/Nodes: Stable 9 mm subcarinal node identified. No changes identified. Lungs/Pleura: A 2.7 x 3 cm irregular mass along the MEDIAL aspect of the LEFT major fissure is again identified. A 1.6 x 1.7 cm irregular RIGHT LOWER lobe basilar nodule is unchanged. Other pulmonary opacities are unchanged and not hypermetabolic on recent PET-CT. Moderate to severe centrilobular emphysema identified. Bibasilar fibrosis and biapical pleuroparenchymal scarring are again noted. No pleural effusion or pneumothorax. Upper Abdomen: No acute abnormality. Musculoskeletal: No acute or suspicious bony abnormalities. Review of the MIP images confirms the above findings. IMPRESSION: 1. No evidence of acute abnormality. No evidence of pulmonary emboli. 2. Unchanged 2.7 x 3 cm LEFT retro hilar lung mass compatible with malignancy is identified on recent PET CT. 3. Unchanged 1.7 cm RIGHT LOWER lobe basilar nodule. 4. Bibasilar pulmonary fibrosis. 5. Aortic Atherosclerosis (ICD10-I70.0) and Emphysema (ICD10-J43.9). Electronically Signed   By: Margarette Canada M.D.   On: 09/20/2018 16:31    EKG: Independently reviewed. Sinus rhythm, right axis deviation, high voltage T waves are more pronounced compared to prior.  Assessment/Plan Principal Problem:   Acute on chronic respiratory failure with hypoxia (HCC) Active Problems:   Hypertension   Alcohol abuse   Coronary artery disease   Protein-calorie malnutrition, severe   COPD  exacerbation (HCC)   Lung mass   Chronic diastolic CHF (congestive heart failure) ([place])   Hyponatremia   Depression   Vincent Gilbert is a 72 y.o. male with medical history significant for chronic respiratory failure with hypoxia on 2-3 L supplemental O2 via [place] at home, COPD, lung mass suspected to be a primary bronchogenic neoplasm by PET scan 09/14/2018, hypertension, chronic diastolic CHF, history of CVA, CAD, and alcohol dependence  who presents the ED with increased dyspnea on exertion and poor oral intake over 1 week and admitted with acute on chronic respiratory failure with hypoxia.  Acute on chronic respiratory failure with hypoxia/COPD: Progressive and worsening condition due to end-stage COPD and high likelihood lung cancer based on imaging.  He is requiring increased O2 requirement from baseline but not in respiratory distress. -Continue supplemental oxygen, wean down as able -Schedule duo nebs, PRN albuterol nebs -Solu-Medrol 40 mg every 12 hours -Continue Dulera, Incruse   Left lower lobe lung mass, suspected bronchogenic neoplasm: Had a long conversation with patient and daughter regarding the PET/CT results and comparisons from prior CT scans.  Patient has previously been noted as a poor candidate for surgical and/or chemoradiation options which likely continues to be the case.  Their preference is towards maintaining quality of life. -They have asked to be seen by Dr. Luan Pulling tomorrow if able   EtOH abuse: Patient drinking 8-9 beers a day with very minimal other oral intake. -CIWA checks with PRN Ativan   Hyponatremia: Sodium 127 on admission.  Secondary to low solute diet,alcohol use, and dehydration. -Continue IV fluids overnight, goal sodium 133-135 over next 24 hours -recheck labs in a.m.   Severe protein calorie malnutrition: Patient with known malnutrition exacerbated with depression and low desire to maintain oral intake. -Consult to  nutrition  Depression: He reports depressed mood, but denies suicidal or homicidal ideation.  He reports associated sleep difficulty. -Increase home mirtazapine to 15 mg nightly, may help with depression, appetite, and sleep  Hypertension: He is actually hypotensive on admission due to poor oral intake. -Hold home amlodipine, lisinopril, Lopressor  CAD: He denies any chest pain. -Continue aspirin 81 mg daily -Holding Lopressor as above  Chronic diastolic CHF: He is hypervolemic on exam. -Holding home Lasix, Lopressor  DVT prophylaxis: Lovenox Code Status: DNR, confirmed with patient and daughter at bedside Family Communication: Discussed with patient and daughter at bedside Disposition Plan: Pending progress of respiratory status, further goals of care discussions Consults called: None Admission status: Inpatient   Zada Finders MD [facility] Pager [phone]  If 7PM-7AM, please contact night-coverage [email or web address] Password [email or web address]  09/20/2018, 7:54 PM

## 2018-09-20 NOTE — ED Provider Notes (Signed)
[facility] EMERGENCY DEPARTMENT Provider Note   CSN: [id number] Arrival date & time: 09/20/18  1235     History   Chief Complaint Chief Complaint  Patient presents with  . Weakness    HPI Vincent Gilbert is a 72 y.o. male.  HPI With history of severe COPD on 3 L of supplemental oxygen continuously.  Also has a history of CAD.  Presents with 1 week of worsening generalized weakness and decreased oral intake.  Also has had increased shortness of breath and nonproductive cough.  Patient is complaining of pain to the left side of his throat especially when he swallows.  Tolerating secretions.  No abdominal pain, vomiting or diarrhea.  Denies chest pain. Past Medical History:  Diagnosis Date  . Carotid artery occlusion   . COPD (chronic obstructive pulmonary disease) ([place])   . Coronary artery disease   . GERD (gastroesophageal reflux disease)   . Heart murmur   . Hypertension   . Myocardial infarction ([place]) 12/12  . Stroke [facility]) 09/10/2011    Patient Active Problem List   Diagnosis Date Noted  . Acute diastolic CHF (congestive heart failure) ([place]) 08/31/2018  . Lung mass   . Goals of care, counseling/discussion 08/13/2018  . COPD (chronic obstructive pulmonary disease) ([place]) 08/12/2018  . COPD exacerbation ([place]) 08/10/2018  . Acute on chronic respiratory failure with hypoxia ([place]) 03/24/2018  . COPD with acute exacerbation ([place]) 03/24/2018  . Protein-calorie malnutrition, severe 03/22/2018  . CAP (community acquired pneumonia) 03/19/2018  . Occlusion and stenosis of carotid artery without mention of cerebral infarction 03/30/2012  . Occlusion and stenosis of carotid artery with cerebral infarction 03/08/2012  . Bruit 01/06/2012  . Coronary artery disease   . Hypertension 09/10/2011  . Tobacco abuse 09/10/2011  . Alcohol abuse 09/10/2011  . GERD (gastroesophageal reflux disease) 09/10/2011  . Acute myocardial infarction of inferoposterior wall, initial episode of  care [facility]) 09/10/2011    Past Surgical History:  Procedure Laterality Date  . CARDIAC CATHETERIZATION  09-15-2011   90% mid RCA stenosis. Normal EF  . CORONARY ANGIOPLASTY WITH STENT PLACEMENT  09-15-2011   mid RCA: 3.5 X32 mm Promus DES  . ENDARTERECTOMY  03/11/2012   Procedure: ENDARTERECTOMY CAROTID;  Surgeon: Mal Misty, MD;  Location: [place];  Service: Vascular;  Laterality: Right;  . EYE SURGERY  rt  . LEFT HEART CATHETERIZATION WITH CORONARY ANGIOGRAM N/A 09/15/2011   Procedure: LEFT HEART CATHETERIZATION WITH CORONARY ANGIOGRAM;  Surgeon: Sherren Mocha, MD;  Location: [facility] CATH LAB;  Service: Cardiovascular;  Laterality: N/A;  . PERCUTANEOUS CORONARY STENT INTERVENTION (PCI-S) N/A 09/15/2011   Procedure: PERCUTANEOUS CORONARY STENT INTERVENTION (PCI-S);  Surgeon: Sherren Mocha, MD;  [facility] C[facility];  Service: Cardiovascular;  Laterality: N/A;  . VASECTOMY    . YAG LASER APPLICATION Right 12/29/8117   Procedure: YAG LASER APPLICATION;  Surgeon: Williams Che, MD;  Location: [facility];  Service: Ophthalmology;  Laterality: Right;        Home Medications    Prior to Admission medications   Medication Sig Start Date End Date Taking? Authorizing Provider  amLODipine (NORVASC) 10 MG tablet Take 10 mg by mouth daily.   Yes [provider]  aspirin EC 81 MG tablet Take 81 mg by mouth every morning.    Yes [provider]  budesonide-formoterol (SYMBICORT) 160-4.5 MCG/ACT inhaler Inhale 2 puffs into the lungs 2 (two) times daily.   Yes [provider]  cetirizine (ZYRTEC) 10 MG tablet Take  10 mg by mouth daily.   Yes [provider]  feeding supplement, ENSURE ENLIVE, (ENSURE ENLIVE) LIQD Take 237 mLs by mouth 2 (two) times daily between meals. 03/26/18  Yes Tat, Shanon Brow, MD  fluticasone (FLONASE) 50 MCG/ACT nasal spray Place 2 sprays into both nostrils daily. 11/15/14  Yes [provider]  furosemide (LASIX) 20 MG tablet Take 1 tablet  (20 mg total) by mouth daily. 09/04/18  Yes Patrecia Pour, MD  guaiFENesin (MUCINEX) 600 MG 12 hr tablet Take 600 mg by mouth 2 (two) times daily.    Yes [provider]  ipratropium-albuterol (DUONEB) 0.5-2.5 (3) MG/3ML SOLN Take 3 mLs by nebulization every 6 (six) hours as needed (shortness of breath and wheeze). 09/03/18  Yes Patrecia Pour, MD  lisinopril (PRINIVIL,ZESTRIL) 40 MG tablet Take 1 tablet (40 mg total) by mouth daily. 05/04/15  Yes Branch, Alphonse Guild, MD  LORazepam (ATIVAN) 0.5 MG tablet Take 1 tablet (0.5 mg total) by mouth every 6 (six) hours as needed for anxiety. 08/17/18  Yes Tat, Shanon Brow, MD  magnesium oxide (MAG-OX) 400 (241.3 Mg) MG tablet Take 1 tablet (400 mg total) by mouth 2 (two) times daily. 08/17/18  Yes Tat, Shanon Brow, MD  metoprolol (LOPRESSOR) 100 MG tablet TAKE 1 TABLET (100 MG TOTAL) BY MOUTH 2 (TWO) TIMES DAILY. Patient taking differently: Take 50-100 mg by mouth See admin instructions. Take 150mg  every morning and 50mg  every night 07/28/16  Yes Branch, Alphonse Guild, MD  mirtazapine (REMERON) 7.5 MG tablet Take 7.5 mg by mouth every morning.  03/08/18  Yes [provider]  Multiple Vitamin (MULTIVITAMIN) tablet Take 1 tablet by mouth daily.   Yes [provider]  pantoprazole (PROTONIX) 40 MG tablet Take 40 mg by mouth every morning.    Yes [provider]  potassium chloride 20 MEQ TBCR Take 40 mEq by mouth daily. 09/03/18  Yes Patrecia Pour, MD  predniSONE (DELTASONE) 20 MG tablet Take 3 tablets (60 mg total) by mouth daily with breakfast for 3 days, THEN 2 tablets (40 mg total) daily with breakfast for 3 days, THEN 1 tablet (20 mg total) daily with breakfast for 3 days, THEN 0.5 tablets (10 mg total) daily with breakfast for 21 days. And decrease by one tablet every 2 days. 09/03/18 10/03/18 Yes Patrecia Pour, MD  rosuvastatin (CRESTOR) 40 MG tablet Take 40 mg by mouth daily after supper.   Yes [provider]  Tiotropium Bromide  Monohydrate (SPIRIVA RESPIMAT) 2.5 MCG/ACT AERS Inhale 2 puffs into the lungs every morning.   Yes [provider]  oral electrolytes (THERMOTABS) TABS tablet Take 1 tablet by mouth daily.    [provider]    Family History Family History  Problem Relation Age of Onset  . Heart disease Mother   . Heart disease Father     Social History Social History   Tobacco Use  . Smoking status: Former Smoker    Packs/day: 0.75    Years: 40.00    Pack years: 30.00    Types: Cigarettes    Start date: 02/26/1956  . Smokeless tobacco: Former Systems developer    Quit date: 03/21/2018  . Tobacco comment: 1/2 pk per day  Substance Use Topics  . Alcohol use: Yes    Alcohol/week: 6.0 standard drinks    Types: 6 Cans of beer per week    Comment: daily  . Drug use: No     Allergies   Oxycodone and Codeine  Review of Systems Review of Systems  Constitutional: Positive for fatigue. Negative for chills and fever.  HENT: Positive for sore throat. Negative for sinus pressure, sinus pain and voice change.   Eyes: Negative for visual disturbance.  Respiratory: Positive for cough and shortness of breath.   Cardiovascular: Negative for chest pain.  Gastrointestinal: Negative for abdominal pain, constipation, diarrhea, nausea and vomiting.  Genitourinary: Negative for flank pain, frequency and hematuria.  Musculoskeletal: Negative for back pain, myalgias, neck pain and neck stiffness.  Skin: Negative for rash and wound.  Neurological: Negative for dizziness, syncope, light-headedness, numbness and headaches.  All other systems reviewed and are negative.    Physical Exam Updated Vital Signs BP (!) 100/55   Pulse 72   Temp 98.2 F (36.8 C) (Oral)   Resp 20   Ht 5\' 9"  (1.753 m)   Wt 54.4 kg   SpO2 100%   BMI 17.72 kg/m   Physical Exam Vitals signs and nursing note reviewed.  Constitutional:      Appearance: Normal appearance. He is well-developed.     Comments: Chronically  ill-appearing  HENT:     Head: Normocephalic and atraumatic.     Mouth/Throat:     Mouth: Mucous membranes are moist.     Pharynx: Posterior oropharyngeal erythema present. No oropharyngeal exudate.     Comments: Erythematous oropharynx.  Uvula is midline.  No tonsillar exudates. Eyes:     Extraocular Movements: Extraocular movements intact.     Conjunctiva/sclera: Conjunctivae normal.     Pupils: Pupils are equal, round, and reactive to light.  Neck:     Musculoskeletal: Normal range of motion and neck supple. No neck rigidity or muscular tenderness.     Vascular: No carotid bruit.  Cardiovascular:     Rate and Rhythm: Normal rate and regular rhythm.  Pulmonary:     Effort: Pulmonary effort is normal.     Comments: Diminished breath sounds throughout with expiratory wheezing. Abdominal:     General: Bowel sounds are normal.     Palpations: Abdomen is soft.     Tenderness: There is no abdominal tenderness. There is no guarding or rebound.  Musculoskeletal: Normal range of motion.        General: No tenderness.     Comments: No lower extremity swelling, asymmetry or tenderness.  Distal pulses intact.  Lymphadenopathy:     Cervical: No cervical adenopathy.  Skin:    General: Skin is warm and dry.     Capillary Refill: Capillary refill takes less than 2 seconds.     Findings: No erythema or rash.  Neurological:     General: No focal deficit present.     Mental Status: He is alert and oriented to person, place, and time.     Comments: 5/5 motor in all extremities.  Sensation fully intact.  Psychiatric:        Behavior: Behavior normal.      ED Treatments / Results  Labs (all labs ordered are listed, but only abnormal results are displayed) Labs Reviewed  CBC WITH DIFFERENTIAL/PLATELET - Abnormal; Notable for the following components:      Result Value   WBC 11.3 (*)    RBC 3.43 (*)    Hemoglobin 11.2 (*)    HCT 33.7 (*)    Neutro Abs 8.5 (*)    Monocytes Absolute 1.3  (*)    Abs Immature Granulocytes 0.08 (*)    All other components within normal limits  BRAIN NATRIURETIC PEPTIDE -  Abnormal; Notable for the following components:   B Natriuretic Peptide 156.0 (*)    All other components within normal limits  COMPREHENSIVE METABOLIC PANEL - Abnormal; Notable for the following components:   Sodium 127 (*)    Chloride 81 (*)    CO2 38 (*)    Glucose, Bld 120 (*)    BUN 7 (*)    Calcium 8.5 (*)    Total Protein 5.9 (*)    Albumin 3.2 (*)    All other components within normal limits  URINALYSIS, ROUTINE W REFLEX MICROSCOPIC - Abnormal; Notable for the following components:   Color, Urine STRAW (*)    Specific Gravity, Urine 1.004 (*)    All other components within normal limits  GROUP A STREP BY PCR  MAGNESIUM  TROPONIN I    EKG EKG Interpretation  Date/Time:  Monday September 20 2018 13:46:01 EST Ventricular Rate:  64 PR Interval:    QRS Duration: 84 QT Interval:  396 QTC Calculation: 409 R Axis:   101 Text Interpretation:  Sinus rhythm Right axis deviation ST elevation, consider inferior injury Confirmed by Julianne Rice [phone]) on 09/20/2018 2:01:35 PM   Radiology Dg Chest 2 View  Result Date: 09/20/2018 CLINICAL DATA:  Cough with generalized weakness EXAM: CHEST - 2 VIEW COMPARISON:  08/30/2018, CT 08/15/2018, PET-CT 06/02/2017, radiograph 04/28/2017 FINDINGS: Hyperinflation with emphysematous disease. Stable left upper lobe 17 mm ovoid nodule. No acute airspace disease or pleural effusion. Stable cardiomediastinal silhouette. No pneumothorax. Reticular opacity at the bases consistent with scarring and fibrosis. Left lower lobe retro hilar mass presumably contributing to increased hilar density. IMPRESSION: 1. Hyperinflation with emphysematous disease and basilar fibrosis. 2. Asymmetric opacity in the left hilar region presumably corresponds to known left retro hilar mass. 3. Stable left upper lobe ovoid pulmonary nodule. Electronically  Signed   By: Donavan Foil M.D.   On: 09/20/2018 14:36   Ct Angio Chest Pe W And/or Wo Contrast  Result Date: 09/20/2018 CLINICAL DATA:  72 year old male with weakness, shortness of breath, hypoxia and pain. EXAM: CT ANGIOGRAPHY CHEST WITH CONTRAST TECHNIQUE: Multidetector CT imaging of the chest was performed using the standard protocol during bolus administration of intravenous contrast. Multiplanar CT image reconstructions and MIPs were obtained to evaluate the vascular anatomy. CONTRAST:  156mL ISOVUE-370 IOPAMIDOL (ISOVUE-370) INJECTION 76% COMPARISON:  08/15/2018 and prior CTs. 09/20/2018 and prior chest radiographs. 09/13/2018 and 06/02/2017 PET CT FINDINGS: Cardiovascular: This is a technically satisfactory study. No pulmonary emboli are identified. Heart size normal. Coronary artery and aortic atherosclerotic calcifications are present. No thoracic aortic aneurysm or pericardial effusion. Mediastinum/Nodes: Stable 9 mm subcarinal node identified. No changes identified. Lungs/Pleura: A 2.7 x 3 cm irregular mass along the MEDIAL aspect of the LEFT major fissure is again identified. A 1.6 x 1.7 cm irregular RIGHT LOWER lobe basilar nodule is unchanged. Other pulmonary opacities are unchanged and not hypermetabolic on recent PET-CT. Moderate to severe centrilobular emphysema identified. Bibasilar fibrosis and biapical pleuroparenchymal scarring are again noted. No pleural effusion or pneumothorax. Upper Abdomen: No acute abnormality. Musculoskeletal: No acute or suspicious bony abnormalities. Review of the MIP images confirms the above findings. IMPRESSION: 1. No evidence of acute abnormality. No evidence of pulmonary emboli. 2. Unchanged 2.7 x 3 cm LEFT retro hilar lung mass compatible with malignancy is identified on recent PET CT. 3. Unchanged 1.7 cm RIGHT LOWER lobe basilar nodule. 4. Bibasilar pulmonary fibrosis. 5. Aortic Atherosclerosis (ICD10-I70.0) and Emphysema (ICD10-J43.9). Electronically  Signed   By: Dellis Filbert  Hu M.D.   On: 09/20/2018 16:31    Procedures Procedures (including critical care time)  Medications Ordered in ED Medications  methylPREDNISolone sodium succinate (SOLU-MEDROL) 125 mg/2 mL injection 125 mg (125 mg Intravenous Given 09/20/18 1415)  levalbuterol (XOPENEX) nebulizer solution 0.63 mg (0.63 mg Nebulization Given 09/20/18 1406)  sodium chloride 0.9 % bolus 500 mL (0 mLs Intravenous Stopped 09/20/18 1457)  sodium chloride 0.9 % bolus 500 mL (500 mLs Intravenous New Bag/Given 09/20/18 1550)  iopamidol (ISOVUE-370) 76 % injection 100 mL (100 mLs Intravenous Contrast Given 09/20/18 1601)     Initial Impression / Assessment and Plan / ED Course  I have reviewed the triage vital signs and the nursing notes.  Pertinent labs & imaging results that were available during my care of the patient were reviewed by me and considered in my medical decision making (see chart for details).    Patient with recent PET scan concerning for left-sided primary lung cancer.  Oxygen saturations improved with increased supplemental oxygen.  Received Solu-Medrol and nebulized treatment with minimal improvement of his shortness of breath.  CT angio chest without evidence of PE or pneumonia.  Likely COPD exacerbation, poor oral intake and deconditioning.  Discussed with hospitalist who will see patient in the emergency department and admit.   Final Clinical Impressions(s) / ED Diagnoses   Final diagnoses:  COPD exacerbation ([place])  Generalized weakness    ED Discharge Orders    None       Julianne Rice, MD 09/20/18 [phone]

## 2018-09-20 NOTE — Patient Outreach (Signed)
Telephone call to patient for transition of care week 3, no answer to either phone [phone] or [phone], left voicemail on both phones requesting return phone call.   PLAN Outreach pt in 3-4 business days  Jacqlyn Larsen [facility], [place] [phone]

## 2018-09-20 NOTE — ED Triage Notes (Signed)
Pt reports generalized weakness and intermittent pain in left side of throat for the past week.  Daughter says pt hasn't eaten much over the past week.  Reports o2 sat was in 70's when he woke up this morning on oxygen 2.5liters.

## 2018-09-21 ENCOUNTER — Encounter (HOSPITAL_COMMUNITY): Payer: Self-pay | Admitting: Primary Care

## 2018-09-21 DIAGNOSIS — Z7189 Other specified counseling: Secondary | ICD-10-CM

## 2018-09-21 DIAGNOSIS — J984 Other disorders of lung: Secondary | ICD-10-CM

## 2018-09-21 DIAGNOSIS — Z87891 Personal history of nicotine dependence: Secondary | ICD-10-CM

## 2018-09-21 DIAGNOSIS — Z515 Encounter for palliative care: Secondary | ICD-10-CM

## 2018-09-21 DIAGNOSIS — J449 Chronic obstructive pulmonary disease, unspecified: Secondary | ICD-10-CM

## 2018-09-21 DIAGNOSIS — Z77090 Contact with and (suspected) exposure to asbestos: Secondary | ICD-10-CM

## 2018-09-21 LAB — BASIC METABOLIC PANEL
Anion gap: 7 (ref 5–15)
BUN: 10 mg/dL (ref 8–23)
CO2: 36 mmol/L — ABNORMAL HIGH (ref 22–32)
CREATININE: 0.79 mg/dL (ref 0.61–1.24)
Calcium: 8.1 mg/dL — ABNORMAL LOW (ref 8.9–10.3)
Chloride: 86 mmol/L — ABNORMAL LOW (ref 98–111)
GFR calc Af Amer: >60 mL/min (ref >60)
GFR calc non Af Amer: >60 mL/min (ref >60)
Glucose, Bld: 152 mg/dL — ABNORMAL HIGH (ref 70–99)
Potassium: 4.6 mmol/L (ref 3.5–5.1)
Sodium: 129 mmol/L — ABNORMAL LOW (ref 135–145)

## 2018-09-21 LAB — CBC
HEMATOCRIT: 32.5 % — AB (ref 39.0–52.0)
Hemoglobin: 10.8 g/dL — ABNORMAL LOW (ref 13.0–17.0)
MCH: 33.4 pg (ref 26.0–34.0)
MCHC: 33.2 g/dL (ref 30.0–36.0)
MCV: 100.6 fL — ABNORMAL HIGH (ref 80.0–100.0)
Platelets: 254 10*3/uL (ref 150–400)
RBC: 3.23 MIL/uL — ABNORMAL LOW (ref 4.22–5.81)
RDW: 12.2 % (ref 11.5–15.5)
WBC: 10.6 10*3/uL — ABNORMAL HIGH (ref 4.0–10.5)
nRBC: 0 % (ref 0.0–0.2)

## 2018-09-21 MED ORDER — IPRATROPIUM-ALBUTEROL 0.5-2.5 (3) MG/3ML IN SOLN
3.0000 mL | Freq: Four times a day (QID) | RESPIRATORY_TRACT | Status: DC
  Administered 2018-09-21 – 2018-09-23 (×9): 3 mL via RESPIRATORY_TRACT
  Filled 2018-09-21 (×9): qty 3

## 2018-09-21 NOTE — Progress Notes (Signed)
Initial Nutrition Assessment  DOCUMENTATION CODES:   Severe malnutrition in context of chronic illness, Underweight  INTERVENTION:  Monitor Mg, PO4, K daily - pt at risk for refeeding due to chronic EtOH and poor PO  Ensure Enlive po BID, each supplement provides 350 kcal and 20 grams of protein (patient likes strawberry)  Magic cup TID with meals, each supplement provides 290 kcal and 9 grams of protein (patient requested orange cream)  Snacks between meals   NUTRITION DIAGNOSIS:   Severe Malnutrition related to chronic illness(COPD; chronic resp failure) as evidenced by percent weight loss, severe fat depletion, moderate muscle depletion, energy intake < or equal to 75% for > or equal to 1 month.   GOAL:   Patient will meet greater than or equal to 90% of their needs   MONITOR:   PO intake, Labs, I & O's, Supplement acceptance, Weight trends(patient at risk for refeeding)  REASON FOR ASSESSMENT:   Consult Malnutrition Eval  ASSESSMENT:  72 year old male with medical history significant for chronic resp failure on 2-3L O2 at home, COPD, HTN, CHF, CVA, CAD, EtOH dependent, suspected bronchogenic neoplasm s/p 12/24 PET presented to ED with increased dyspnea, SOB, and poor PO x 1 week   Per pulmonology note, patient seen by palliative care team with plans for hospice. Patient is not a good candidate for chemo/radiation and oncology consulted per daughter's request.   Patient awake with daughter in room at visit. Patient reports feeling just ok and complains of a sore throat. He stated that a culture was done and he was waiting for the results. Patient endorses eating well this morning and recalls french toast, scrambled eggs, bacon, and juice. He reports general poor PO prior to admission and LOA over the past few weeks. Patient with reported long history of EtOH dependence; palliative note states patient has been sneaking down to his shop and smoking/drinking and attributes  that to poor appetite and feelings of weakness.   Patient had strawberry Ensure on bedside table and stated they tasted all right and would like to continue getting them. Patient open to receiving snacks in between meals and reports really liking ice cream; RD to order orange cream MC.   Medications reviewed and include: folic acid, mucinex, remeron, methylprednisolone, MVI, thiamine  Labs: Na 129 (L) Corrected Ca for low albumin 8.1 (L)  NUTRITION - FOCUSED PHYSICAL EXAM:    Most Recent Value  Orbital Region  Severe depletion  Upper Arm Region  Moderate depletion  Thoracic and Lumbar Region  Moderate depletion  Buccal Region  Severe depletion  Temple Region  Moderate depletion  Clavicle Bone Region  Severe depletion  Clavicle and Acromion Bone Region  Moderate depletion  Scapular Bone Region  Unable to assess  Dorsal Hand  Moderate depletion  Patellar Region  Unable to assess  Anterior Thigh Region  Unable to assess  Posterior Calf Region  Unable to assess  Edema (RD Assessment)  None  Hair  Reviewed  Eyes  Reviewed  Mouth  Reviewed  Skin  Reviewed  Nails  Reviewed       Diet Order:   Diet Order            Diet regular Room service appropriate? Yes; Fluid consistency: Thin  Diet effective now              EDUCATION NEEDS:   No education needs have been identified at this time  Skin:  Skin Assessment: Reviewed RN Assessment  Last  BM:  09/19/2018  Height:   Ht Readings from Last 1 Encounters:  09/20/18 5\' 9"  (1.753 m)    Weight: 119.7lbs  Wt Readings from Last 1 Encounters:  09/20/18 54.4 kg   Usual Body Weight: 135 lbs  Ideal Body Weight:  72.7 kg 159.9lbs  BMI:  Body mass index is 17.72 kg/m.  Estimated Nutritional Needs:   Kcal:  7289-7915 (30-35kcal/kg)  Protein:  76-92g (1.4-1.7g/kg)  Fluid:  </=2L    Lajuan Lines, RD, LDN  After Hours/Weekend Pager: [phone]

## 2018-09-21 NOTE — Progress Notes (Signed)
Patient ID: ABDELAZIZ WESTENBERGER, male   DOB: [birth year], 72 y.o.   MRN: [id number]  PROGRESS NOTE    ZARIUS FURR  BOF:[id number] DOB: 1946-01-22 DOA: 09/20/2018 PCP: Glenda Chroman, MD   Brief Narrative:  HAKOP HUMBARGER is a 72 y.o. male with medical history significant for chronic respiratory failure with hypoxia on 2-3 L supplemental O2 via [place] at home, COPD, lung mass suspected to be a primary bronchogenic neoplasm by PET scan 09/14/2018, hypertension, chronic diastolic CHF, history of CVA, CAD, and alcohol dependence who presents the ED with increased dyspnea on exertion and poor oral intake over 1 week.  His daughter and POA is at bedside.  Patient reports worsened dyspnea exertion compared to baseline with shortness of breath developing after just walking a few steps.  He has cough with occasional scant white sputum production.  He says he has a depressed mood and attributes this to not eating at all (except for occasional candy) for the last week.  He says he drinks 8 to 9 twelve ounce beers per day.  He is normally on 2 L supplemental O2 at home but has to had to increase to 3 L.  Denies any fevers, chest pain, abdominal pain, dysuria.  He has had some diarrhea.  He and his daughter are not aware of the recent PET scan results and see her to discuss this in person with Dr. Luan Pulling with upcoming appointment, however have asked to be notified with the results during my examination.  Assessment & Plan:   Principal Problem:   Acute on chronic respiratory failure with hypoxia (HCC) Active Problems:   Hypertension   Alcohol abuse   Coronary artery disease   Protein-calorie malnutrition, severe   COPD exacerbation (HCC)   Lung mass   Chronic diastolic CHF (congestive heart failure) (HCC)   Hyponatremia   Depression  Acute on chronic respiratory failure with hypoxia/COPD: Progressive and worsening condition due to end-stage COPD and high likelihood lung cancer based on imaging.  He is  requiring increased O2 requirement from baseline but not in respiratory distress. -Continue supplemental oxygen, wean down as able -Schedule duo nebs, PRN albuterol nebs -Solu-Medrol 40 mg every 12 hours -Continue Dulera, Incruse -Ambulate patient on 2 L [place] if he desats   Left lower lobe lung mass, suspected bronchogenic neoplasm: Had a long conversation with patient and daughter regarding the PET/CT results and comparisons from prior CT scans.  Patient has previously been noted as a poor candidate for surgical and/or chemoradiation options which likely continues to be the case.  Their preference is towards maintaining quality of life. -They have asked to be seen by Dr. Luan Pulling tomorrow if able -Dr. Luan Pulling consulted -Apparently hospice nurse went out to see them as an outpatient and daughter was not very happy with the nurse -Obtain palliative care consult   EtOH abuse: Patient drinking 8-9 beers a day with very minimal other oral intake. -CIWA checks with PRN Ativan   Hyponatremia: Sodium 127 on admission.  Secondary to low solute diet,alcohol use, and dehydration. -Continue IV fluids overnight, goal sodium 133-135 over next 24 hours -recheck labs in a.m.   Severe protein calorie malnutrition: Patient with known malnutrition exacerbated with depression and low desire to maintain oral intake. -Consult to nutrition  Depression: He reports depressed mood, but denies suicidal or homicidal ideation.  He reports associated sleep difficulty. -Increase home mirtazapine to 15 mg nightly, may help with depression, appetite, and sleep  Hypertension: He is actually  hypotensive on admission due to poor oral intake. -Hold home amlodipine, lisinopril, Lopressor  CAD: He denies any chest pain. -Continue aspirin 81 mg daily -Holding Lopressor as above  Chronic diastolic CHF: He is hypervolemic on exam. -Holding home Lasix, Lopressor    DVT prophylaxis: Lovenox Code  Status: DNR  family Communication: Daughter  disposition Plan: Pending Dr. Luan Pulling evaluation and palliative care   Subjective: No complaints  Objective: Vitals:   09/20/18 2347 09/21/18 0030 09/21/18 0603 09/21/18 0735  BP: (!) 96/50 (!) 108/54 (!) 117/59   Pulse: 67  79   Resp: 18  20   Temp: 98.5 F (36.9 C)  98.3 F (36.8 C)   TempSrc: Oral  Oral   SpO2: 99%  100% 98%  Weight:      Height:        Intake/Output Summary (Last 24 hours) at 09/21/2018 1020 Last data filed at 09/21/2018 0940 Gross per 24 hour  Intake 1627.21 ml  Output 800 ml  Net 827.21 ml   Filed Weights   09/20/18 1313  Weight: 54.4 kg    Examination:  General exam: Appears calm and comfortable  Respiratory system: Clear to auscultation. Respiratory effort normal. Cardiovascular system: S1 & S2 heard, RRR. No JVD, murmurs, rubs, gallops or clicks. No pedal edema. Gastrointestinal system: Abdomen is nondistended, soft and nontender. No organomegaly or masses felt. Normal bowel sounds heard. Central nervous system: Alert and oriented. No focal neurological deficits. Extremities: Symmetric 5 x 5 power. Skin: No rashes, lesions or ulcers Psychiatry: Judgement and insight appear normal. Mood & affect appropriate.     Data Reviewed: I have personally reviewed following labs and imaging studies  CBC: Recent Labs  Lab 09/20/18 1418 09/21/18 0541  WBC 11.3* 10.6*  NEUTROABS 8.5*  --   HGB 11.2* 10.8*  HCT 33.7* 32.5*  MCV 98.3 100.6*  PLT 261 161   Basic Metabolic Panel: Recent Labs  Lab 09/20/18 1418 09/21/18 0541  NA 127* 129*  K 4.6 4.6  CL 81* 86*  CO2 38* 36*  GLUCOSE 120* 152*  BUN 7* 10  CREATININE 0.74 0.79  CALCIUM 8.5* 8.1*  MG 1.7  --    GFR: Estimated Creatinine Clearance: 66.1 mL/min (by C-G formula based on SCr of 0.79 mg/dL). Liver Function Tests: Recent Labs  Lab 09/20/18 1418  AST 20  ALT 17  ALKPHOS 52  BILITOT 0.7  PROT 5.9*  ALBUMIN 3.2*   No  results for input(s): LIPASE, AMYLASE in the last 168 hours. No results for input(s): AMMONIA in the last 168 hours. Coagulation Profile: No results for input(s): INR, PROTIME in the last 168 hours. Cardiac Enzymes: Recent Labs  Lab 09/20/18 1418  TROPONINI <0.03   BNP (last 3 results) No results for input(s): PROBNP in the last 8760 hours. HbA1C: No results for input(s): HGBA1C in the last 72 hours. CBG: No results for input(s): GLUCAP in the last 168 hours. Lipid Profile: No results for input(s): CHOL, HDL, LDLCALC, TRIG, CHOLHDL, LDLDIRECT in the last 72 hours. Thyroid Function Tests: No results for input(s): TSH, T4TOTAL, FREET4, T3FREE, THYROIDAB in the last 72 hours. Anemia Panel: No results for input(s): VITAMINB12, FOLATE, FERRITIN, TIBC, IRON, RETICCTPCT in the last 72 hours. Sepsis Labs: No results for input(s): PROCALCITON, LATICACIDVEN in the last 168 hours.  Recent Results (from the past 240 hour(s))  Group A Strep by PCR     Status: None   Collection Time: 09/20/18  2:18 PM  Result Value Ref  Range Status   Group A Strep by PCR NOT DETECTED NOT DETECTED Final    Comment: Performed at [facility], [address] [place], [place] [address]         Radiology Studies: Dg Chest 2 View  Result Date: 09/20/2018 CLINICAL DATA:  Cough with generalized weakness EXAM: CHEST - 2 VIEW COMPARISON:  08/30/2018, CT 08/15/2018, PET-CT 06/02/2017, radiograph 04/28/2017 FINDINGS: Hyperinflation with emphysematous disease. Stable left upper lobe 17 mm ovoid nodule. No acute airspace disease or pleural effusion. Stable cardiomediastinal silhouette. No pneumothorax. Reticular opacity at the bases consistent with scarring and fibrosis. Left lower lobe retro hilar mass presumably contributing to increased hilar density. IMPRESSION: 1. Hyperinflation with emphysematous disease and basilar fibrosis. 2. Asymmetric opacity in the left hilar region presumably corresponds to known left retro  hilar mass. 3. Stable left upper lobe ovoid pulmonary nodule. Electronically Signed   By: Donavan Foil M.D.   On: 09/20/2018 14:36   Ct Angio Chest Pe W And/or Wo Contrast  Result Date: 09/20/2018 CLINICAL DATA:  72 year old male with weakness, shortness of breath, hypoxia and pain. EXAM: CT ANGIOGRAPHY CHEST WITH CONTRAST TECHNIQUE: Multidetector CT imaging of the chest was performed using the standard protocol during bolus administration of intravenous contrast. Multiplanar CT image reconstructions and MIPs were obtained to evaluate the vascular anatomy. CONTRAST:  159mL ISOVUE-370 IOPAMIDOL (ISOVUE-370) INJECTION 76% COMPARISON:  08/15/2018 and prior CTs. 09/20/2018 and prior chest radiographs. 09/13/2018 and 06/02/2017 PET CT FINDINGS: Cardiovascular: This is a technically satisfactory study. No pulmonary emboli are identified. Heart size normal. Coronary artery and aortic atherosclerotic calcifications are present. No thoracic aortic aneurysm or pericardial effusion. Mediastinum/Nodes: Stable 9 mm subcarinal node identified. No changes identified. Lungs/Pleura: A 2.7 x 3 cm irregular mass along the MEDIAL aspect of the LEFT major fissure is again identified. A 1.6 x 1.7 cm irregular RIGHT LOWER lobe basilar nodule is unchanged. Other pulmonary opacities are unchanged and not hypermetabolic on recent PET-CT. Moderate to severe centrilobular emphysema identified. Bibasilar fibrosis and biapical pleuroparenchymal scarring are again noted. No pleural effusion or pneumothorax. Upper Abdomen: No acute abnormality. Musculoskeletal: No acute or suspicious bony abnormalities. Review of the MIP images confirms the above findings. IMPRESSION: 1. No evidence of acute abnormality. No evidence of pulmonary emboli. 2. Unchanged 2.7 x 3 cm LEFT retro hilar lung mass compatible with malignancy is identified on recent PET CT. 3. Unchanged 1.7 cm RIGHT LOWER lobe basilar nodule. 4. Bibasilar pulmonary fibrosis. 5. Aortic  Atherosclerosis (ICD10-I70.0) and Emphysema (ICD10-J43.9). Electronically Signed   By: Margarette Canada M.D.   On: 09/20/2018 16:31        Scheduled Meds: . aspirin EC  81 mg Oral q morning - 10a  . enoxaparin (LOVENOX) injection  40 mg Subcutaneous Q24H  . feeding supplement (ENSURE ENLIVE)  237 mL Oral BID BM  . folic acid  1 mg Oral Daily  . guaiFENesin  600 mg Oral BID  . ipratropium-albuterol  3 mL Nebulization QID  . methylPREDNISolone (SOLU-MEDROL) injection  40 mg Intravenous Q12H  . mirtazapine  15 mg Oral QHS  . mometasone-formoterol  2 puff Inhalation BID  . multivitamin with minerals  1 tablet Oral Daily  . rosuvastatin  40 mg Oral QPC supper  . thiamine  100 mg Oral Daily   Or  . thiamine  100 mg Intravenous Daily  . umeclidinium bromide  1 puff Inhalation Daily   Continuous Infusions:   LOS: 1 day    Time spent: 25  minutes    Tahiri Shareef A, MD [facility] Pager [phone]  If 7PM-7AM, please contact night-coverage [email or web address] Password [email or web address] 09/21/2018, 10:20 AM

## 2018-09-21 NOTE — Consult Note (Signed)
Consultation Note Date: 09/21/2018   Patient Name: Vincent Gilbert  DOB: Aug 04, 1946  MRN: [id number]  Age / Sex: 72 y.o., male  PCP: Glenda Chroman, MD Referring Physician: Phillips Grout, MD  Reason for Consultation: Establishing goals of care  HPI/Patient Profile: 72 y.o. male  with past medical history ofchronic respiratory failure with hypoxia on 2-3 L supplemental O2 via [place] at home, COPD, lung mass suspected to be a primary bronchogenic neoplasm by PET scan 09/14/2018, hypertension, chronic diastolic CHF, history of CVA, CAD, and alcohol dependence who presents the ED with increased dyspnea on exertion and poor oral intake over 1 week admitted on 09/20/2018 with Acute on chronic respiratory failure with hypoxia, left lower lobe lung mass.  Palliative medicine consulted for continued discussions related to goals of care, hospice services.   Clinical Assessment and Goals of Care: Vincent Gilbert is resting quietly in bed.  He wakes easily greeting me, making and somewhat keeping eye contact.  He is calm and cooperative, pleasant.  Present today at bedside his daughter/HC POA, Vincent Gilbert.  We talked about his new diagnosis of suspected lung cancer.  Vincent Gilbert and Vincent Gilbert talked about his sudden decline over the last month, 1 to 2 weeks in particular.  They share that he has not been eating well.  Vincent Gilbert shares that he had been sneaking down to his shop smoking, and drinking beer, not eating well.  He tells me that he thinks this is why he has become so weak.  We talked about what is normal and expected as people are nearing end of life including sleeping more, eating and interacting less.  Vincent Gilbert states that her father slept from 6 PM Sunday night to 1030 Monday morning.  We talked about his alcohol and tobacco use.  We also talked about protein shakes.  Vincent Gilbert shares that she is not happy with her  current hospice provider, [facility].  Vincent Gilbert states that she knows Vincent Gilbert with [facility] hosp[facility] and request consultation with him.  Contact to Vincent Gilbert, [name] who states that he can see family today between 68 and 2 PM.  We talked about preferred place of death, Vincent Gilbert states he would prefer to die at home.  Vincent Gilbert shares that her mother died approximately 5 years ago from lung cancer.  She shares that she was diagnosed and passed in under 2 weeks.  She talks about logistics of caring for her father including needing a notary for durable power of attorney paperwork, and arranging the home to best care for Vincent Gilbert.  She tells me that she has FMLA from her work at [place] for [employer] on [facility].  We talked about consultation with oncologist/cancer doctor.  Vincent Gilbert at first states that he is interested in hearing what a cancer doctor has to say, but then make and reminds him that they have been told several times that he is not strong enough for any treatment.  I share that I agree, and I  encouraged him to consider what he does with the energy that he has left.  Would he prefer to be at home on his porch or watching TV, being with family versus going to doctor's appointments.  Vincent Gilbert states that she has faith and trust and pulmonologist, Dr. Luan Pulling.  She states that they will listen to his suggestions.  Conference with nursing staff related to goals of care discussion, [facility] hosp[facility] visit, family desire for notary.  Conference with hospitalist related to goals of care discussion, hospice consultation.  HCPOA  HCPOA -daughter, Vincent Gilbert.  Healthcare power of attorney paperwork completed December of this year, in document tab of epic chart.  Vincent Gilbert has no other children.  His wife died 5 years ago.   SUMMARY OF RECOMMENDATIONS   Home with the benefits of hospice, likely new provider [facility]. At this point Vincent Gilbert is encouraging  her father for quality over quantity, he agrees.   Code Status/Advance Care Planning:  DNR  Symptom Management:   Per hospitalist, no additional needs at this time.  Per hospice protocol, no additional needs at this time.  Palliative Prophylaxis:   Frequent Pain Assessment  Additional Recommendations (Limitations, Scope, Preferences):  Hospice care at home.  Psycho-social/Spiritual:   Desire for further Chaplaincy support:yes  Additional Recommendations: Caregiving  Support/Resources and Education on Hospice  Prognosis:   < 6 weeks, or less would not be surprising based on advancing cancer, frailty, poor functional status, and poor nutritional status.  Discharge Planning: Anticipate home with the benefits of hospice, new provider [facility].      Primary Diagnoses: Present on Admission: . Acute on chronic respiratory failure with hypoxia ([place]) . Hypertension . Alcohol abuse . Coronary artery disease . Protein-calorie malnutrition, severe . COPD exacerbation ([place]) . Lung mass . Chronic diastolic CHF (congestive heart failure) ([place])   I have reviewed the medical record, interviewed the patient and family, and examined the patient. The following aspects are pertinent.  Past Medical History:  Diagnosis Date  . Carotid artery occlusion   . COPD (chronic obstructive pulmonary disease) ([place])   . Coronary artery disease   . GERD (gastroesophageal reflux disease)   . Heart murmur   . Hypertension   . Myocardial infarction ([place]) 12/12  . Stroke [facility]) 09/10/2011   Social History   Socioeconomic History  . Marital status: Widowed    Spouse name: Not on file  . Number of children: Not on file  . Years of education: Not on file  . Highest education level: Not on file  Occupational History  . Not on file  Social Needs  . Financial resource strain: Not on file  . Food insecurity:    Worry: Not on file    Inability: Not on file  . Transportation needs:     Medical: Not on file    Non-medical: Not on file  Tobacco Use  . Smoking status: Former Smoker    Packs/day: 0.75    Years: 40.00    Pack years: 30.00    Types: Cigarettes    Start date: 02/26/1956  . Smokeless tobacco: Former Systems developer    Quit date: 03/21/2018  . Tobacco comment: 1/2 pk per day  Substance and Sexual Activity  . Alcohol use: Yes    Alcohol/week: 6.0 standard drinks    Types: 6 Cans of beer per week    Comment: daily  . Drug use: No  . Sexual activity: Not on file  Lifestyle  . Physical activity:  Days per week: Not on file    Minutes per session: Not on file  . Stress: Not on file  Relationships  . Social connections:    Talks on phone: Not on file    Gets together: Not on file    Attends religious service: Not on file    Active member of club or organization: Not on file    Attends meetings of clubs or organizations: Not on file    Relationship status: Not on file  Other Topics Concern  . Not on file  Social History Narrative  . Not on file   Family History  Problem Relation Age of Onset  . Heart disease Mother   . Heart disease Father    Scheduled Meds: . aspirin EC  81 mg Oral q morning - 10a  . enoxaparin (LOVENOX) injection  40 mg Subcutaneous Q24H  . feeding supplement (ENSURE ENLIVE)  237 mL Oral BID BM  . folic acid  1 mg Oral Daily  . guaiFENesin  600 mg Oral BID  . ipratropium-albuterol  3 mL Nebulization QID  . methylPREDNISolone (SOLU-MEDROL) injection  40 mg Intravenous Q12H  . mirtazapine  15 mg Oral QHS  . mometasone-formoterol  2 puff Inhalation BID  . multivitamin with minerals  1 tablet Oral Daily  . rosuvastatin  40 mg Oral QPC supper  . thiamine  100 mg Oral Daily   Or  . thiamine  100 mg Intravenous Daily  . umeclidinium bromide  1 puff Inhalation Daily   Continuous Infusions: PRN Meds:.acetaminophen **OR** acetaminophen, albuterol, LORazepam **OR** LORazepam, ondansetron **OR** ondansetron (ZOFRAN) IV Medications Prior to  Admission:  Prior to Admission medications   Medication Sig Start Date End Date Taking? Authorizing Provider  amLODipine (NORVASC) 10 MG tablet Take 10 mg by mouth daily.   Yes [provider]  aspirin EC 81 MG tablet Take 81 mg by mouth every morning.    Yes [provider]  budesonide-formoterol (SYMBICORT) 160-4.5 MCG/ACT inhaler Inhale 2 puffs into the lungs 2 (two) times daily.   Yes [provider]  cetirizine (ZYRTEC) 10 MG tablet Take 10 mg by mouth daily.   Yes [provider]  feeding supplement, ENSURE ENLIVE, (ENSURE ENLIVE) LIQD Take 237 mLs by mouth 2 (two) times daily between meals. 03/26/18  Yes Tat, Shanon Brow, MD  fluticasone (FLONASE) 50 MCG/ACT nasal spray Place 2 sprays into both nostrils daily. 11/15/14  Yes [provider]  furosemide (LASIX) 20 MG tablet Take 1 tablet (20 mg total) by mouth daily. 09/04/18  Yes Patrecia Pour, MD  guaiFENesin (MUCINEX) 600 MG 12 hr tablet Take 600 mg by mouth 2 (two) times daily.    Yes [provider]  ipratropium-albuterol (DUONEB) 0.5-2.5 (3) MG/3ML SOLN Take 3 mLs by nebulization every 6 (six) hours as needed (shortness of breath and wheeze). 09/03/18  Yes Patrecia Pour, MD  lisinopril (PRINIVIL,ZESTRIL) 40 MG tablet Take 1 tablet (40 mg total) by mouth daily. 05/04/15  Yes Branch, Alphonse Guild, MD  LORazepam (ATIVAN) 0.5 MG tablet Take 1 tablet (0.5 mg total) by mouth every 6 (six) hours as needed for anxiety. 08/17/18  Yes Tat, Shanon Brow, MD  magnesium oxide (MAG-OX) 400 (241.3 Mg) MG tablet Take 1 tablet (400 mg total) by mouth 2 (two) times daily. 08/17/18  Yes Tat, Shanon Brow, MD  metoprolol (LOPRESSOR) 100 MG tablet TAKE 1 TABLET (100 MG TOTAL) BY MOUTH 2 (TWO) TIMES DAILY. Patient taking differently: Take 50-100 mg by mouth  See admin instructions. Take 150mg  every morning and 50mg  every night 07/28/16  Yes Branch, Alphonse Guild, MD  mirtazapine (REMERON) 7.5 MG tablet Take 7.5 mg by mouth every morning.   03/08/18  Yes [provider]  Multiple Vitamin (MULTIVITAMIN) tablet Take 1 tablet by mouth daily.   Yes [provider]  pantoprazole (PROTONIX) 40 MG tablet Take 40 mg by mouth every morning.    Yes [provider]  potassium chloride 20 MEQ TBCR Take 40 mEq by mouth daily. 09/03/18  Yes Patrecia Pour, MD  predniSONE (DELTASONE) 20 MG tablet Take 3 tablets (60 mg total) by mouth daily with breakfast for 3 days, THEN 2 tablets (40 mg total) daily with breakfast for 3 days, THEN 1 tablet (20 mg total) daily with breakfast for 3 days, THEN 0.5 tablets (10 mg total) daily with breakfast for 21 days. And decrease by one tablet every 2 days. 09/03/18 10/03/18 Yes Patrecia Pour, MD  rosuvastatin (CRESTOR) 40 MG tablet Take 40 mg by mouth daily after supper.   Yes [provider]  Tiotropium Bromide Monohydrate (SPIRIVA RESPIMAT) 2.5 MCG/ACT AERS Inhale 2 puffs into the lungs every morning.   Yes [provider]  oral electrolytes (THERMOTABS) TABS tablet Take 1 tablet by mouth daily.    [provider]   Allergies  Allergen Reactions  . Oxycodone     nausea  . Codeine Palpitations   Review of Systems  Unable to perform ROS: Acuity of condition    Physical Exam Vitals signs and nursing note reviewed.  Constitutional:      Comments: Makes and somewhat keeps eye contact, appears chronically ill and frail, thin.  HENT:     Head:     Comments: Some temporal wasting Cardiovascular:     Rate and Rhythm: Normal rate.  Pulmonary:     Effort: Pulmonary effort is normal.     Comments: Some shortness of breath noted with extended speaking Abdominal:     General: Abdomen is flat. There is no distension.     Palpations: Abdomen is soft.  Musculoskeletal:        General: No swelling.  Skin:    General: Skin is warm and dry.     Comments: Areas of bruising all over  Neurological:     Mental Status: He is alert and oriented to person, place, and  time.  Psychiatric:     Comments: Calm and cooperative, not fearful.     Vital Signs: BP (!) 117/59   Pulse 79   Temp 98.3 F (36.8 C) (Oral)   Resp 20   Ht 5\' 9"  (1.753 m)   Wt 54.4 kg   SpO2 98%   BMI 17.72 kg/m  Pain Scale: 0-10   Pain Score: 0-No pain   SpO2: SpO2: 98 % O2 Device:SpO2: 98 % O2 Flow Rate: .O2 Flow Rate (L/min): 4 L/min  IO: Intake/output summary:   Intake/Output Summary (Last 24 hours) at 09/21/2018 1218 Last data filed at 09/21/2018 0940 Gross per 24 hour  Intake 1627.21 ml  Output 800 ml  Net 827.21 ml    LBM: Last BM Date: 09/19/18 Baseline Weight: Weight: 54.4 kg Most recent weight: Weight: 54.4 kg     Palliative Assessment/Data:   Flowsheet Rows     Most Recent Value  Intake Tab  Referral Department  Hospitalist  Unit at Time of Referral  Cardiac/Telemetry Unit  Palliative Care Primary Diagnosis  Cancer  Date Notified  09/21/18  Palliative Care Type  New Palliative care  Reason for referral  Clarify Goals of Care  Date of Admission  09/20/18  Date first seen by Palliative Care  09/21/18  # of days Palliative referral response time  0 Day(s)  # of days IP prior to Palliative referral  1  Clinical Assessment  Palliative Performance Scale Score  30%  Pain Max last 24 hours  Not able to report  Pain Min Last 24 hours  Not able to report  Dyspnea Max Last 24 Hours  Not able to report  Dyspnea Min Last 24 hours  Not able to report  Psychosocial & Spiritual Assessment  Palliative Care Outcomes  Patient/Family meeting held?  Yes  Who was at the meeting?  pt and daughter  Patient/Family wishes: Interventions discontinued/not started   Mechanical Ventilation      Time In: 1110 Time Out: 1220 Time Total: 70 minutes  Greater than 50%  of this time was spent counseling and coordinating care related to the above assessment and plan.  Signed by: Drue Novel, NP   Please contact Palliative Medicine Team phone at [phone] for  questions and concerns.  For individual provider: See Shea Evans

## 2018-09-21 NOTE — Progress Notes (Signed)
PT Cancellation Note  Patient Details Name: Vincent Gilbert MRN: [id number] DOB: 1946/01/31   Cancelled Treatment:    Reason Eval/Treat Not Completed: Patient declined, no reason specified.   Floria Raveling. Hartnett-Rands, MS, PT Per [place] [phone] 09/21/2018, 1:32 PM

## 2018-09-21 NOTE — Consult Note (Signed)
Consult requested by: [facility] hospitalist, Dr. Shanon Brow Consult requested for: Presumed lung cancer/abnormal PET scan/COPD  HPI: This is a 72 year old who I have been seeing for the last several months.  He came to the emergency department because he had increasing weakness and shortness of breath.  He had PET scan last week that shows hypermetabolic very large mass with a separate mass on the other side.  This is presumed to be lung cancer.  He has become increasingly weak over the last several days.  He is not eating and not drinking well.  It takes him several hours to take a shower and get dressed.  He has been seen by palliative care team with plans for hospice care which I think is appropriate.  I do not think he could undergo a biopsy safely.  His daughter is requesting an oncology consultation if it can be done while he is in the hospital.  She understands that he is not really a candidate for chemotherapy or radiation but wants to get some further information about staging.  He has lost weight.  He is coughing.  He is short of breath.  He is not eating.  He has had some mild chest pain.  Past Medical History:  Diagnosis Date  . Carotid artery occlusion   . COPD (chronic obstructive pulmonary disease) ([place])   . Coronary artery disease   . GERD (gastroesophageal reflux disease)   . Heart murmur   . Hypertension   . Myocardial infarction ([place]) 12/12  . Stroke [facility]) 09/10/2011     Family History  Problem Relation Age of Onset  . Heart disease Mother   . Heart disease Father      Social History   Socioeconomic History  . Marital status: Widowed    Spouse name: Not on file  . Number of children: Not on file  . Years of education: Not on file  . Highest education level: Not on file  Occupational History  . Not on file  Social Needs  . Financial resource strain: Not on file  . Food insecurity:    Worry: Not on file    Inability: Not on file  . Transportation needs:    Medical:  Not on file    Non-medical: Not on file  Tobacco Use  . Smoking status: Former Smoker    Packs/day: 0.75    Years: 40.00    Pack years: 30.00    Types: Cigarettes    Start date: 02/26/1956  . Smokeless tobacco: Former Systems developer    Quit date: 03/21/2018  . Tobacco comment: 1/2 pk per day  Substance and Sexual Activity  . Alcohol use: Yes    Alcohol/week: 6.0 standard drinks    Types: 6 Cans of beer per week    Comment: daily  . Drug use: No  . Sexual activity: Not on file  Lifestyle  . Physical activity:    Days per week: Not on file    Minutes per session: Not on file  . Stress: Not on file  Relationships  . Social connections:    Talks on phone: Not on file    Gets together: Not on file    Attends religious service: Not on file    Active member of club or organization: Not on file    Attends meetings of clubs or organizations: Not on file    Relationship status: Not on file  Other Topics Concern  . Not on file  Social History Narrative  .  Not on file     ROS: Except as mentioned 10 point review of systems is negative    Objective: Vital signs in last 24 hours: Temp:  [97.7 F (36.5 C)-98.5 F (36.9 C)] 97.7 F (36.5 C) (12/31 1203) Pulse Rate:  [63-101] 101 (12/31 1203) Resp:  [12-25] 19 (12/31 1203) BP: (91-152)/(50-91) 125/91 (12/31 1203) SpO2:  [96 %-100 %] 96 % (12/31 1348) Weight change:  Last BM Date: 09/19/18  Intake/Output from previous day: 12/30 0701 - 12/31 0700 In: 1387.2 [I.V.:387.2; IV Piggyback:1000] Out: 800 [Urine:800]  PHYSICAL EXAM Constitutional: He is thinner than the last time I saw him.  He looks very weak.  Eyes: Pupils react EOMI.  Ears nose mouth and throat: His mucous membranes are dry.  His neck is supple.  Cardiovascular: His heart is regular now.  Respiratory: Respiratory effort is increased and he has prolonged expiratory phase.  Gastrointestinal: His abdomen is soft with no masses.  Musculoskeletal: He is very weak in all 4  extremities.  Neurologically: No focal abnormalities.  Psychiatric: Normal mood and affect  Lab Results: Basic Metabolic Panel: Recent Labs    09/20/18 1418 09/21/18 0541  NA 127* 129*  K 4.6 4.6  CL 81* 86*  CO2 38* 36*  GLUCOSE 120* 152*  BUN 7* 10  CREATININE 0.74 0.79  CALCIUM 8.5* 8.1*  MG 1.7  --    Liver Function Tests: Recent Labs    09/20/18 1418  AST 20  ALT 17  ALKPHOS 52  BILITOT 0.7  PROT 5.9*  ALBUMIN 3.2*   No results for input(s): LIPASE, AMYLASE in the last 72 hours. No results for input(s): AMMONIA in the last 72 hours. CBC: Recent Labs    09/20/18 1418 09/21/18 0541  WBC 11.3* 10.6*  NEUTROABS 8.5*  --   HGB 11.2* 10.8*  HCT 33.7* 32.5*  MCV 98.3 100.6*  PLT 261 254   Cardiac Enzymes: Recent Labs    09/20/18 1418  TROPONINI <0.03   BNP: No results for input(s): PROBNP in the last 72 hours. D-Dimer: No results for input(s): DDIMER in the last 72 hours. CBG: No results for input(s): GLUCAP in the last 72 hours. Hemoglobin A1C: No results for input(s): HGBA1C in the last 72 hours. Fasting Lipid Panel: No results for input(s): CHOL, HDL, LDLCALC, TRIG, CHOLHDL, LDLDIRECT in the last 72 hours. Thyroid Function Tests: No results for input(s): TSH, T4TOTAL, FREET4, T3FREE, THYROIDAB in the last 72 hours. Anemia Panel: No results for input(s): VITAMINB12, FOLATE, FERRITIN, TIBC, IRON, RETICCTPCT in the last 72 hours. Coagulation: No results for input(s): LABPROT, INR in the last 72 hours. Urine Drug Screen: Drugs of Abuse  No results found for: LABOPIA, COCAINSCRNUR, LABBENZ, AMPHETMU, THCU, LABBARB  Alcohol Level: No results for input(s): ETH in the last 72 hours. Urinalysis: Recent Labs    09/20/18 1551  COLORURINE STRAW*  LABSPEC 1.004*  PHURINE 7.0  GLUCOSEU NEGATIVE  HGBUR NEGATIVE  BILIRUBINUR NEGATIVE  KETONESUR NEGATIVE  PROTEINUR NEGATIVE  NITRITE NEGATIVE  LEUKOCYTESUR NEGATIVE   Misc. Labs:   ABGS: No  results for input(s): PHART, PO2ART, TCO2, HCO3 in the last 72 hours.  Invalid input(s): PCO2   MICROBIOLOGY: Recent Results (from the past 240 hour(s))  Group A Strep by PCR     Status: None   Collection Time: 09/20/18  2:18 PM  Result Value Ref Range Status   Group A Strep by PCR NOT DETECTED NOT DETECTED Final    Comment: Performed at [name]  [facility], [address] [place], [place] [address]    Studies/Results: Dg Chest 2 View  Result Date: 09/20/2018 CLINICAL DATA:  Cough with generalized weakness EXAM: CHEST - 2 VIEW COMPARISON:  08/30/2018, CT 08/15/2018, PET-CT 06/02/2017, radiograph 04/28/2017 FINDINGS: Hyperinflation with emphysematous disease. Stable left upper lobe 17 mm ovoid nodule. No acute airspace disease or pleural effusion. Stable cardiomediastinal silhouette. No pneumothorax. Reticular opacity at the bases consistent with scarring and fibrosis. Left lower lobe retro hilar mass presumably contributing to increased hilar density. IMPRESSION: 1. Hyperinflation with emphysematous disease and basilar fibrosis. 2. Asymmetric opacity in the left hilar region presumably corresponds to known left retro hilar mass. 3. Stable left upper lobe ovoid pulmonary nodule. Electronically Signed   By: Donavan Foil M.D.   On: 09/20/2018 14:36   Ct Angio Chest Pe W And/or Wo Contrast  Result Date: 09/20/2018 CLINICAL DATA:  72 year old male with weakness, shortness of breath, hypoxia and pain. EXAM: CT ANGIOGRAPHY CHEST WITH CONTRAST TECHNIQUE: Multidetector CT imaging of the chest was performed using the standard protocol during bolus administration of intravenous contrast. Multiplanar CT image reconstructions and MIPs were obtained to evaluate the vascular anatomy. CONTRAST:  119mL ISOVUE-370 IOPAMIDOL (ISOVUE-370) INJECTION 76% COMPARISON:  08/15/2018 and prior CTs. 09/20/2018 and prior chest radiographs. 09/13/2018 and 06/02/2017 PET CT FINDINGS: Cardiovascular: This is a technically  satisfactory study. No pulmonary emboli are identified. Heart size normal. Coronary artery and aortic atherosclerotic calcifications are present. No thoracic aortic aneurysm or pericardial effusion. Mediastinum/Nodes: Stable 9 mm subcarinal node identified. No changes identified. Lungs/Pleura: A 2.7 x 3 cm irregular mass along the MEDIAL aspect of the LEFT major fissure is again identified. A 1.6 x 1.7 cm irregular RIGHT LOWER lobe basilar nodule is unchanged. Other pulmonary opacities are unchanged and not hypermetabolic on recent PET-CT. Moderate to severe centrilobular emphysema identified. Bibasilar fibrosis and biapical pleuroparenchymal scarring are again noted. No pleural effusion or pneumothorax. Upper Abdomen: No acute abnormality. Musculoskeletal: No acute or suspicious bony abnormalities. Review of the MIP images confirms the above findings. IMPRESSION: 1. No evidence of acute abnormality. No evidence of pulmonary emboli. 2. Unchanged 2.7 x 3 cm LEFT retro hilar lung mass compatible with malignancy is identified on recent PET CT. 3. Unchanged 1.7 cm RIGHT LOWER lobe basilar nodule. 4. Bibasilar pulmonary fibrosis. 5. Aortic Atherosclerosis (ICD10-I70.0) and Emphysema (ICD10-J43.9). Electronically Signed   By: Margarette Canada M.D.   On: 09/20/2018 16:31    Medications:  Prior to Admission:  Medications Prior to Admission  Medication Sig Dispense Refill Last Dose  . amLODipine (NORVASC) 10 MG tablet Take 10 mg by mouth daily.   09/20/2018 at Unknown time  . aspirin EC 81 MG tablet Take 81 mg by mouth every morning.    09/20/2018 at Unknown time  . budesonide-formoterol (SYMBICORT) 160-4.5 MCG/ACT inhaler Inhale 2 puffs into the lungs 2 (two) times daily.   09/19/2018 at Unknown time  . cetirizine (ZYRTEC) 10 MG tablet Take 10 mg by mouth daily.   09/20/2018 at Unknown time  . feeding supplement, ENSURE ENLIVE, (ENSURE ENLIVE) LIQD Take 237 mLs by mouth 2 (two) times daily between meals. 60 Bottle 0  Past Week at Unknown time  . fluticasone (FLONASE) 50 MCG/ACT nasal spray Place 2 sprays into both nostrils daily.  4 09/20/2018 at Unknown time  . furosemide (LASIX) 20 MG tablet Take 1 tablet (20 mg total) by mouth daily. 30 tablet 0 09/20/2018 at Unknown time  . guaiFENesin (MUCINEX) 600 MG 12 hr  tablet Take 600 mg by mouth 2 (two) times daily.    09/20/2018 at Unknown time  . ipratropium-albuterol (DUONEB) 0.5-2.5 (3) MG/3ML SOLN Take 3 mLs by nebulization every 6 (six) hours as needed (shortness of breath and wheeze). 90 mL 0 09/19/2018 at Unknown time  . lisinopril (PRINIVIL,ZESTRIL) 40 MG tablet Take 1 tablet (40 mg total) by mouth daily. 90 tablet 3 09/20/2018 at Unknown time  . LORazepam (ATIVAN) 0.5 MG tablet Take 1 tablet (0.5 mg total) by mouth every 6 (six) hours as needed for anxiety. 30 tablet 0 09/20/2018 at Unknown time  . magnesium oxide (MAG-OX) 400 (241.3 Mg) MG tablet Take 1 tablet (400 mg total) by mouth 2 (two) times daily. 60 tablet 0 09/20/2018 at Unknown time  . metoprolol (LOPRESSOR) 100 MG tablet TAKE 1 TABLET (100 MG TOTAL) BY MOUTH 2 (TWO) TIMES DAILY. (Patient taking differently: Take 50-100 mg by mouth See admin instructions. Take 150mg  every morning and 50mg  every night) 60 tablet 3 09/20/2018 at 1030a  . mirtazapine (REMERON) 7.5 MG tablet Take 7.5 mg by mouth every morning.    09/20/2018 at Unknown time  . Multiple Vitamin (MULTIVITAMIN) tablet Take 1 tablet by mouth daily.   09/20/2018 at Unknown time  . pantoprazole (PROTONIX) 40 MG tablet Take 40 mg by mouth every morning.    09/20/2018 at Unknown time  . potassium chloride 20 MEQ TBCR Take 40 mEq by mouth daily. 60 tablet 0 09/20/2018 at Unknown time  . predniSONE (DELTASONE) 20 MG tablet Take 3 tablets (60 mg total) by mouth daily with breakfast for 3 days, THEN 2 tablets (40 mg total) daily with breakfast for 3 days, THEN 1 tablet (20 mg total) daily with breakfast for 3 days, THEN 0.5 tablets (10 mg total) daily  with breakfast for 21 days. And decrease by one tablet every 2 days. 30 tablet 0 09/20/2018 at Unknown time  . rosuvastatin (CRESTOR) 40 MG tablet Take 40 mg by mouth daily after supper.   09/18/2018 at Unknown time  . Tiotropium Bromide Monohydrate (SPIRIVA RESPIMAT) 2.5 MCG/ACT AERS Inhale 2 puffs into the lungs every morning.   09/19/2018 at Unknown time  . oral electrolytes (THERMOTABS) TABS tablet Take 1 tablet by mouth daily.   Taking   Scheduled: . aspirin EC  81 mg Oral q morning - 10a  . enoxaparin (LOVENOX) injection  40 mg Subcutaneous Q24H  . feeding supplement (ENSURE ENLIVE)  237 mL Oral BID BM  . folic acid  1 mg Oral Daily  . guaiFENesin  600 mg Oral BID  . ipratropium-albuterol  3 mL Nebulization QID  . methylPREDNISolone (SOLU-MEDROL) injection  40 mg Intravenous Q12H  . mirtazapine  15 mg Oral QHS  . mometasone-formoterol  2 puff Inhalation BID  . multivitamin with minerals  1 tablet Oral Daily  . rosuvastatin  40 mg Oral QPC supper  . thiamine  100 mg Oral Daily   Or  . thiamine  100 mg Intravenous Daily  . umeclidinium bromide  1 puff Inhalation Daily   Continuous:  FOY:DXAJOINOMVEHM **OR** acetaminophen, albuterol, LORazepam **OR** LORazepam, ondansetron **OR** ondansetron (ZOFRAN) IV  Assesment: He has acute on chronic hypoxic respiratory failure.  He has severe protein calorie malnutrition I think related to lung cancer.  He has a lung mass that is almost certainly cancer based on the PET scan. Principal Problem:   Acute on chronic respiratory failure with hypoxia ([facility]) Active Problems:   Hypertension   Alcohol abuse  Coronary artery disease   Protein-calorie malnutrition, severe   COPD exacerbation ([facility])   Lung mass   Chronic diastolic CHF (congestive heart failure) ([place])   Hyponatremia   Depression   Palliative care by specialist   Encounter for hospice care discussion    Plan: I put in for oncology consultation at his daughter's request.  As  mentioned she understands that there is little that can be done.  Plan to continue with his treatments for COPD.   nutritional supplementation.  Hospice care.  If he needs hospice attending I would be happy to do that for him    LOS: 1 day   Alonza Bogus 09/21/2018, 1:54 PM

## 2018-09-21 NOTE — Consult Note (Signed)
[facility] Consultation Oncology  Name: Vincent Gilbert      MRN: [id number]    Location: E527/P824-23  Date: 09/21/2018 Time:4:49 PM   REFERRING PHYSICIAN: Dr. Luan Pulling.  REASON FOR CONSULT: Highly probable lung cancer.   DIAGNOSIS: Presumed lung cancer.  HISTORY OF PRESENT ILLNESS: Vincent Gilbert is a 72 year old very pleasant white male who is seen in consultation today for further work-up and management of hypermetabolic bilateral lung lesions on PET scan.  He had a recent PET CT scan dated 09/13/2018 for follow-up of abnormal lung lesions.  This showed 2.2 x 2.7 cm left lung hilar lesion, hypermetabolic with SUV of 9.4.  There is a 1.7 x 1.3 cm irregular nodule in the posterior right lung base with SUV of 2.8.  No other hypermetabolic lung lesions were seen.  Patient is a current active smoker.  he also drinks about 8 beers per day. He worked in [employer] for 2 and half years when he had exposure to asbestos.  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.  He lives by himself at home and spends most of the day in the chair or bed.  He minimally walks back and forth to the bathroom.  He is on oxygen 24/7.  He has lost significant weight in the last 3 to 6 months but unable to quantify it.  He is not eating much according to the daughter.  He reports that he does not have any appetite.  PAST MEDICAL HISTORY:   Past Medical History:  Diagnosis Date  . Carotid artery occlusion   . COPD (chronic obstructive pulmonary disease) ([place])   . Coronary artery disease   . GERD (gastroesophageal reflux disease)   . Heart murmur   . Hypertension   . Myocardial infarction ([facility]) 12/12  . Stroke ([place]) 09/10/2011    ALLERGIES: Allergies  Allergen Reactions  . Oxycodone     nausea  . Codeine Palpitations      MEDICATIONS: I have reviewed the patient's current medications.     PAST SURGICAL HISTORY Past Surgical History:  Procedure Laterality Date  . CARDIAC  CATHETERIZATION  09-15-2011   90% mid RCA stenosis. Normal EF  . CORONARY ANGIOPLASTY WITH STENT PLACEMENT  09-15-2011   mid RCA: 3.5 X32 mm Promus DES  . ENDARTERECTOMY  03/11/2012   Procedure: ENDARTERECTOMY CAROTID;  Surgeon: Mal Misty, MD;  Location: [facility];  Service: Vascular;  Laterality: Right;  . EYE SURGERY  rt  . LEFT HEART CATHETERIZATION WITH CORONARY ANGIOGRAM N/A 09/15/2011   Procedure: LEFT HEART CATHETERIZATION WITH CORONARY ANGIOGRAM;  Surgeon: Sherren Mocha, MD;  Location: [facility] CATH LAB;  Service: Cardiovascular;  Laterality: N/A;  . PERCUTANEOUS CORONARY STENT INTERVENTION (PCI-S) N/A 09/15/2011   Procedure: PERCUTANEOUS CORONARY STENT INTERVENTION (PCI-S);  Surgeon: Sherren Mocha, MD;  Location: [facility] CATH LAB;  Service: Cardiovascular;  Laterality: N/A;  . VASECTOMY    . YAG LASER APPLICATION Right 5/36/1443   Procedure: YAG LASER APPLICATION;  Surgeon: Williams Che, MD;  Location: [facility];  Service: Ophthalmology;  Laterality: Right;    FAMILY HISTORY: Family History  Problem Relation Age of Onset  . Heart disease Mother   . Heart disease Father     SOCIAL HISTORY:  reports that he has quit smoking. His smoking use included cigarettes. He started smoking about 62 years ago. He has a 30.00 pack-year smoking history. He quit smokeless tobacco use about 6 months ago. He reports current alcohol  use of about 6.0 standard drinks of alcohol per week. He reports that he does not use drugs.  PERFORMANCE STATUS: The patient's performance status is 3 - Symptomatic, >50% confined to bed  PHYSICAL EXAM: Most Recent Vital Signs: Blood pressure (!) 125/91, pulse (!) 101, temperature 97.7 F (36.5 C), temperature source Axillary, resp. rate 19, height 5\' 9"  (1.753 m), weight 120 lb (54.4 kg), SpO2 96 %. BP (!) 125/91 (BP Location: Left Arm)   Pulse (!) 101   Temp 97.7 F (36.5 C) (Axillary)   Resp 19   Ht 5\' 9"  (1.753 m)   Wt 120 lb (54.4 kg)   SpO2 96%   BMI 17.72  kg/m  General appearance: alert, cooperative and appears stated age Lungs: diminished breath sounds bibasilar Heart: regular rate and rhythm Abdomen: soft, non-tender; bowel sounds normal; no masses,  no organomegaly Extremities: no edema, redness or tenderness in the calves or thighs Lymph nodes: Cervical, supraclavicular, and axillary nodes normal. Neurologic: Grossly normal  LABORATORY DATA:  Results for orders placed or performed during the hospital encounter of 09/20/18 (from the past 48 hour(s))  CBC with Differential/Platelet     Status: Abnormal   Collection Time: 09/20/18  2:18 PM  Result Value Ref Range   WBC 11.3 (H) 4.0 - 10.5 K/uL   RBC 3.43 (L) 4.22 - 5.81 MIL/uL   Hemoglobin 11.2 (L) 13.0 - 17.0 g/dL   HCT 33.7 (L) 39.0 - 52.0 %   MCV 98.3 80.0 - 100.0 fL   MCH 32.7 26.0 - 34.0 pg   MCHC 33.2 30.0 - 36.0 g/dL   RDW 12.2 11.5 - 15.5 %   Platelets 261 150 - 400 K/uL   nRBC 0.0 0.0 - 0.2 %   Neutrophils Relative % 75 %   Neutro Abs 8.5 (H) 1.7 - 7.7 K/uL   Lymphocytes Relative 12 %   Lymphs Abs 1.4 0.7 - 4.0 K/uL   Monocytes Relative 12 %   Monocytes Absolute 1.3 (H) 0.1 - 1.0 K/uL   Eosinophils Relative 0 %   Eosinophils Absolute 0.0 0.0 - 0.5 K/uL   Basophils Relative 0 %   Basophils Absolute 0.0 0.0 - 0.1 K/uL   Immature Granulocytes 1 %   Abs Immature Granulocytes 0.08 (H) 0.00 - 0.07 K/uL    Comment: Performed at [facility], [address], [place], [place] [address]  Brain natriuretic peptide     Status: Abnormal   Collection Time: 09/20/18  2:18 PM  Result Value Ref Range   B Natriuretic Peptide 156.0 (H) 0.0 - 100.0 pg/mL    Comment: Performed at [facility], [address], [place], [place] [address]  Comprehensive metabolic panel     Status: Abnormal   Collection Time: 09/20/18  2:18 PM  Result Value Ref Range   Sodium 127 (L) 135 - 145 mmol/L   Potassium 4.6 3.5 - 5.1 mmol/L   Chloride 81 (L) 98 - 111 mmol/L   CO2 38 (H) 22 - 32 mmol/L    Glucose, Bld 120 (H) 70 - 99 mg/dL   BUN 7 (L) 8 - 23 mg/dL   Creatinine, Ser 0.74 0.61 - 1.24 mg/dL   Calcium 8.5 (L) 8.9 - 10.3 mg/dL   Total Protein 5.9 (L) 6.5 - 8.1 g/dL   Albumin 3.2 (L) 3.5 - 5.0 g/dL   AST 20 15 - 41 U/L   ALT 17 0 - 44 U/L   Alkaline Phosphatase 52 38 - 126 U/L  Total Bilirubin 0.7 0.3 - 1.2 mg/dL   GFR calc non Af Amer >60 >60 mL/min   GFR calc Af Amer >60 >60 mL/min   Anion gap 8 5 - 15    Comment: Performed at [facility], [address], [place], [place] [address]  Magnesium     Status: None   Collection Time: 09/20/18  2:18 PM  Result Value Ref Range   Magnesium 1.7 1.7 - 2.4 mg/dL    Comment: Performed at [facility], [name], [place], [place] [address]  Group A Strep by PCR     Status: None   Collection Time: 09/20/18  2:18 PM  Result Value Ref Range   Group A Strep by PCR NOT DETECTED NOT DETECTED    Comment: Performed at [facility], [address], [place], [place] [address]  Troponin I - ONCE - STAT     Status: None   Collection Time: 09/20/18  2:18 PM  Result Value Ref Range   Troponin I <0.03 <0.03 ng/mL    Comment: Performed at [facility], [address] [place], [place] [address]  Urinalysis, Routine w reflex microscopic     Status: Abnormal   Collection Time: 09/20/18  3:51 PM  Result Value Ref Range   Color, Urine STRAW (A) YELLOW   APPearance CLEAR CLEAR   Specific Gravity, Urine 1.004 (L) 1.005 - 1.030   pH 7.0 5.0 - 8.0   Glucose, UA NEGATIVE NEGATIVE mg/dL   Hgb urine dipstick NEGATIVE NEGATIVE   Bilirubin Urine NEGATIVE NEGATIVE   Ketones, ur NEGATIVE NEGATIVE mg/dL   Protein, ur NEGATIVE NEGATIVE mg/dL   Nitrite NEGATIVE NEGATIVE   Leukocytes, UA NEGATIVE NEGATIVE    Comment: Performed at [facility], [address], [place], [place] [address]  Basic metabolic panel     Status: Abnormal   Collection Time: 09/21/18  5:41 AM  Result Value Ref Range   Sodium 129 (L) 135 - 145 mmol/L   Potassium 4.6 3.5 - 5.1  mmol/L   Chloride 86 (L) 98 - 111 mmol/L   CO2 36 (H) 22 - 32 mmol/L   Glucose, Bld 152 (H) 70 - 99 mg/dL   BUN 10 8 - 23 mg/dL   Creatinine, Ser 0.79 0.61 - 1.24 mg/dL   Calcium 8.1 (L) 8.9 - 10.3 mg/dL   GFR calc non Af Amer >60 >60 mL/min   GFR calc Af Amer >60 >60 mL/min   Anion gap 7 5 - 15    Comment: Performed at [facility], [address] [place], [place] [address]  CBC     Status: Abnormal   Collection Time: 09/21/18  5:41 AM  Result Value Ref Range   WBC 10.6 (H) 4.0 - 10.5 K/uL   RBC 3.23 (L) 4.22 - 5.81 MIL/uL   Hemoglobin 10.8 (L) 13.0 - 17.0 g/dL   HCT 32.5 (L) 39.0 - 52.0 %   MCV 100.6 (H) 80.0 - 100.0 fL   MCH 33.4 26.0 - 34.0 pg   MCHC 33.2 30.0 - 36.0 g/dL   RDW 12.2 11.5 - 15.5 %   Platelets 254 150 - 400 K/uL   nRBC 0.0 0.0 - 0.2 %    Comment: Performed at [facility], [address], [place], [place] [address]      RADIOGRAPHY: Dg Chest 2 View  Result Date: 09/20/2018 CLINICAL DATA:  Cough with generalized weakness EXAM: CHEST - 2 VIEW COMPARISON:  08/30/2018, CT 08/15/2018, PET-CT 06/02/2017, radiograph 04/28/2017 FINDINGS:  Hyperinflation with emphysematous disease. Stable left upper lobe 17 mm ovoid nodule. No acute airspace disease or pleural effusion. Stable cardiomediastinal silhouette. No pneumothorax. Reticular opacity at the bases consistent with scarring and fibrosis. Left lower lobe retro hilar mass presumably contributing to increased hilar density. IMPRESSION: 1. Hyperinflation with emphysematous disease and basilar fibrosis. 2. Asymmetric opacity in the left hilar region presumably corresponds to known left retro hilar mass. 3. Stable left upper lobe ovoid pulmonary nodule. Electronically Signed   By: Donavan Foil M.D.   On: 09/20/2018 14:36   Ct Angio Chest Pe W And/or Wo Contrast  Result Date: 09/20/2018 CLINICAL DATA:  72 year old male with weakness, shortness of breath, hypoxia and pain. EXAM: CT ANGIOGRAPHY CHEST WITH CONTRAST TECHNIQUE:  Multidetector CT imaging of the chest was performed using the standard protocol during bolus administration of intravenous contrast. Multiplanar CT image reconstructions and MIPs were obtained to evaluate the vascular anatomy. CONTRAST:  136mL ISOVUE-370 IOPAMIDOL (ISOVUE-370) INJECTION 76% COMPARISON:  08/15/2018 and prior CTs. 09/20/2018 and prior chest radiographs. 09/13/2018 and 06/02/2017 PET CT FINDINGS: Cardiovascular: This is a technically satisfactory study. No pulmonary emboli are identified. Heart size normal. Coronary artery and aortic atherosclerotic calcifications are present. No thoracic aortic aneurysm or pericardial effusion. Mediastinum/Nodes: Stable 9 mm subcarinal node identified. No changes identified. Lungs/Pleura: A 2.7 x 3 cm irregular mass along the MEDIAL aspect of the LEFT major fissure is again identified. A 1.6 x 1.7 cm irregular RIGHT LOWER lobe basilar nodule is unchanged. Other pulmonary opacities are unchanged and not hypermetabolic on recent PET-CT. Moderate to severe centrilobular emphysema identified. Bibasilar fibrosis and biapical pleuroparenchymal scarring are again noted. No pleural effusion or pneumothorax. Upper Abdomen: No acute abnormality. Musculoskeletal: No acute or suspicious bony abnormalities. Review of the MIP images confirms the above findings. IMPRESSION: 1. No evidence of acute abnormality. No evidence of pulmonary emboli. 2. Unchanged 2.7 x 3 cm LEFT retro hilar lung mass compatible with malignancy is identified on recent PET CT. 3. Unchanged 1.7 cm RIGHT LOWER lobe basilar nodule. 4. Bibasilar pulmonary fibrosis. 5. Aortic Atherosclerosis (ICD10-I70.0) and Emphysema (ICD10-J43.9). Electronically Signed   By: Margarette Canada M.D.   On: 09/20/2018 16:31         ASSESSMENT and pLAN:  1.  Presumed lung cancer: - Found to have 2 hypermetabolic lung masses, largest in the left lung retrohilar region measuring 2.2 x 2.7 cm, SUV of 9.4.  Second lesion is on the  right posterior lung base, measuring 1.7 x 1.3 cm with an SUV of 2.8.  Both of them have progressively increased from recent scans. -He is a current active smoker.  He drinks about 8 beers per day. -He has a very poor functional status.  He is reluctant to consider any active therapy. -I agree that he is not a candidate for any aggressive treatment. - He does not have any appetite and has lost weight in the last few months.  I have recommended trying Marinol 2.5 mg twice daily and titrated up as needed.  He has been using Remeron for the past year but it did not help with his appetite. - If his physical condition improves in the next few weeks, he may seek a consultation with radiation oncology and [facility] for SBRT of the 2 lung lesions. -He is planning to enroll in palliative care with [facility] and transition to hospice if his condition declines.  All questions were answered. The patient knows to call the clinic with any problems, questions  or concerns. We can certainly see the patient much sooner if necessary.    Derek Jack

## 2018-09-22 DIAGNOSIS — J441 Chronic obstructive pulmonary disease with (acute) exacerbation: Secondary | ICD-10-CM

## 2018-09-22 DIAGNOSIS — I5032 Chronic diastolic (congestive) heart failure: Secondary | ICD-10-CM

## 2018-09-22 DIAGNOSIS — I2510 Atherosclerotic heart disease of native coronary artery without angina pectoris: Secondary | ICD-10-CM

## 2018-09-22 DIAGNOSIS — J9621 Acute and chronic respiratory failure with hypoxia: Principal | ICD-10-CM

## 2018-09-22 DIAGNOSIS — E43 Unspecified severe protein-calorie malnutrition: Secondary | ICD-10-CM

## 2018-09-22 DIAGNOSIS — Z515 Encounter for palliative care: Secondary | ICD-10-CM

## 2018-09-22 DIAGNOSIS — E871 Hypo-osmolality and hyponatremia: Secondary | ICD-10-CM

## 2018-09-22 DIAGNOSIS — R918 Other nonspecific abnormal finding of lung field: Secondary | ICD-10-CM

## 2018-09-22 DIAGNOSIS — I10 Essential (primary) hypertension: Secondary | ICD-10-CM

## 2018-09-22 MED ORDER — METOPROLOL TARTRATE 5 MG/5ML IV SOLN
5.0000 mg | Freq: Once | INTRAVENOUS | Status: AC
  Administered 2018-09-22: 5 mg via INTRAVENOUS

## 2018-09-22 MED ORDER — METOPROLOL TARTRATE 50 MG PO TABS
150.0000 mg | ORAL_TABLET | Freq: Every morning | ORAL | Status: DC
  Administered 2018-09-22 – 2018-09-23 (×2): 150 mg via ORAL
  Filled 2018-09-22 (×2): qty 3

## 2018-09-22 MED ORDER — PANTOPRAZOLE SODIUM 40 MG PO TBEC
40.0000 mg | DELAYED_RELEASE_TABLET | Freq: Every morning | ORAL | Status: DC
  Administered 2018-09-22 – 2018-09-23 (×2): 40 mg via ORAL
  Filled 2018-09-22 (×2): qty 1

## 2018-09-22 MED ORDER — METOPROLOL TARTRATE 50 MG PO TABS
50.0000 mg | ORAL_TABLET | Freq: Every day | ORAL | Status: DC
  Administered 2018-09-22: 50 mg via ORAL
  Filled 2018-09-22: qty 1

## 2018-09-22 MED ORDER — METOPROLOL TARTRATE 5 MG/5ML IV SOLN
INTRAVENOUS | Status: AC
  Filled 2018-09-22: qty 5

## 2018-09-22 NOTE — Progress Notes (Signed)
Patient ID: Vincent Gilbert, male   DOB: 1946-03-16, 73 y.o.   MRN: [id number]  PROGRESS NOTE    Vincent Gilbert  XNA:[id number] DOB: 26-May-1946 DOA: 09/20/2018 PCP: Glenda Chroman, MD   Brief Narrative:  Vincent Gilbert is a 73 y.o. male with medical history significant for chronic respiratory failure with hypoxia on 2-3 L supplemental O2 via [place] at home, COPD, lung mass suspected to be a primary bronchogenic neoplasm by PET scan 09/14/2018, hypertension, chronic diastolic CHF, history of CVA, CAD, and alcohol dependence who presents the ED with increased dyspnea on exertion and poor oral intake over 1 week.  His daughter and POA is at bedside.  Patient reports worsened dyspnea exertion compared to baseline with shortness of breath developing after just walking a few steps.  He has cough with occasional scant white sputum production.  He says he has a depressed mood and attributes this to not eating at all (except for occasional candy) for the last week.  He says he drinks 8 to 9 twelve ounce beers per day.  He is normally on 2 L supplemental O2 at home but has to had to increase to 3 L.  Denies any fevers, chest pain, abdominal pain, dysuria.  He has had some diarrhea.  He and his daughter are not aware of the recent PET scan results and see her to discuss this in person with Dr. Luan Pulling with upcoming appointment, however have asked to be notified with the results during my examination.  Assessment & Plan:   Principal Problem:   Acute on chronic respiratory failure with hypoxia (HCC) Active Problems:   Hypertension   Alcohol abuse   Coronary artery disease   Protein-calorie malnutrition, severe   COPD exacerbation (HCC)   Lung mass   Chronic diastolic CHF (congestive heart failure) (HCC)   Hyponatremia   Depression   Palliative care by specialist   Encounter for hospice care discussion  Acute on chronic respiratory failure with hypoxia/COPD: Progressive and worsening condition due  to end-stage COPD and high likelihood lung cancer based on imaging.  He is requiring increased He continues to improve with treatments.  Continue for now.  Home tomorrow with home hospice services.   Left lower lobe lung mass, suspected bronchogenic neoplasm: Had a long conversation with patient and daughter regarding the PET/CT results and comparisons from prior CT scans.  Patient has previously been noted as a poor candidate for surgical and/or chemoradiation options which likely continues to be the case.  Their preference is towards maintaining quality of life.  Pt to discharge home tomorrow with hospice services.   EtOH abuse: Patient drinking 8-9 beers a day with very minimal other oral intake. -CIWA checks with PRN Ativan   Hyponatremia: Sodium 127 on admission.  Secondary to low solute diet,alcohol use, and dehydration. Slowly improving.  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   Severe protein calorie malnutrition: Patient with known malnutrition exacerbated with depression and low desire to maintain oral intake. -Consult to nutrition  Depression: He reports depressed mood, but denies suicidal or homicidal ideation.  He reports associated sleep difficulty. -Increase home mirtazapine to 15 mg nightly, may help with depression, appetite, and sleep  Hypertension: He is actually hypotensive on admission due to poor oral intake. -metoprolol has been resumed.  Atrial Fibrillation with RVR - resumed home metoprolol.  Follow.   CAD: He denies any chest pain. -Continue aspirin 81 mg daily -Holding Lopressor as above  Chronic diastolic CHF: He is hypervolemic  on exam. -Holding home Lasix, Lopressor    DVT prophylaxis: Lovenox Code Status: DNR  family Communication: Daughter  disposition Plan: Home tomorrow   Subjective: Pt had some palpitations this morning, restarted on his home metoprolol  Objective: Vitals:   09/22/18 0739 09/22/18 1045 09/22/18 1152  09/22/18 1200  BP:  117/69  127/68  Pulse:  (!) 101  (!) 110  Resp:      Temp:    98.5 F (36.9 C)  TempSrc:      SpO2: 96%  93% 93%  Weight:      Height:        Intake/Output Summary (Last 24 hours) at 09/22/2018 1554 Last data filed at 09/22/2018 1100 Gross per 24 hour  Intake 600 ml  Output 300 ml  Net 300 ml   Filed Weights   09/20/18 1313 09/22/18 0627  Weight: 54.4 kg 55.5 kg    Examination:  General exam: Appears calm and comfortable  Respiratory system: Clear to auscultation. Respiratory effort normal. Cardiovascular system: S1 & S2 heard, RRR. No JVD, murmurs, rubs, gallops or clicks. No pedal edema. Gastrointestinal system: Abdomen is nondistended, soft and nontender. No organomegaly or masses felt. Normal bowel sounds heard. Central nervous system: Alert and oriented. No focal neurological deficits. Extremities: Symmetric 5 x 5 power. Skin: No rashes, lesions or ulcers Psychiatry: Judgement and insight appear normal. Mood & affect appropriate.   Data Reviewed: I have personally reviewed following labs and imaging studies  CBC: Recent Labs  Lab 09/20/18 1418 09/21/18 0541  WBC 11.3* 10.6*  NEUTROABS 8.5*  --   HGB 11.2* 10.8*  HCT 33.7* 32.5*  MCV 98.3 100.6*  PLT 261 242   Basic Metabolic Panel: Recent Labs  Lab 09/20/18 1418 09/21/18 0541  NA 127* 129*  K 4.6 4.6  CL 81* 86*  CO2 38* 36*  GLUCOSE 120* 152*  BUN 7* 10  CREATININE 0.74 0.79  CALCIUM 8.5* 8.1*  MG 1.7  --    GFR: Estimated Creatinine Clearance: 67.4 mL/min (by C-G formula based on SCr of 0.79 mg/dL). Liver Function Tests: Recent Labs  Lab 09/20/18 1418  AST 20  ALT 17  ALKPHOS 52  BILITOT 0.7  PROT 5.9*  ALBUMIN 3.2*   No results for input(s): LIPASE, AMYLASE in the last 168 hours. No results for input(s): AMMONIA in the last 168 hours. Coagulation Profile: No results for input(s): INR, PROTIME in the last 168 hours. Cardiac Enzymes: Recent Labs  Lab  09/20/18 1418  TROPONINI <0.03   BNP (last 3 results) No results for input(s): PROBNP in the last 8760 hours. HbA1C: No results for input(s): HGBA1C in the last 72 hours. CBG: No results for input(s): GLUCAP in the last 168 hours. Lipid Profile: No results for input(s): CHOL, HDL, LDLCALC, TRIG, CHOLHDL, LDLDIRECT in the last 72 hours. Thyroid Function Tests: No results for input(s): TSH, T4TOTAL, FREET4, T3FREE, THYROIDAB in the last 72 hours. Anemia Panel: No results for input(s): VITAMINB12, FOLATE, FERRITIN, TIBC, IRON, RETICCTPCT in the last 72 hours. Sepsis Labs: No results for input(s): PROCALCITON, LATICACIDVEN in the last 168 hours.  Recent Results (from the past 240 hour(s))  Group A Strep by PCR     Status: None   Collection Time: 09/20/18  2:18 PM  Result Value Ref Range Status   Group A Strep by PCR NOT DETECTED NOT DETECTED Final    Comment: Performed at [facility], [address], [place], [place] [address]    Radiology Studies:  Ct Angio Chest Pe W And/or Wo Contrast  Result Date: 09/20/2018 CLINICAL DATA:  73 year old male with weakness, shortness of breath, hypoxia and pain. EXAM: CT ANGIOGRAPHY CHEST WITH CONTRAST TECHNIQUE: Multidetector CT imaging of the chest was performed using the standard protocol during bolus administration of intravenous contrast. Multiplanar CT image reconstructions and MIPs were obtained to evaluate the vascular anatomy. CONTRAST:  157mL ISOVUE-370 IOPAMIDOL (ISOVUE-370) INJECTION 76% COMPARISON:  08/15/2018 and prior CTs. 09/20/2018 and prior chest radiographs. 09/13/2018 and 06/02/2017 PET CT FINDINGS: Cardiovascular: This is a technically satisfactory study. No pulmonary emboli are identified. Heart size normal. Coronary artery and aortic atherosclerotic calcifications are present. No thoracic aortic aneurysm or pericardial effusion. Mediastinum/Nodes: Stable 9 mm subcarinal node identified. No changes identified. Lungs/Pleura: A 2.7 x 3  cm irregular mass along the MEDIAL aspect of the LEFT major fissure is again identified. A 1.6 x 1.7 cm irregular RIGHT LOWER lobe basilar nodule is unchanged. Other pulmonary opacities are unchanged and not hypermetabolic on recent PET-CT. Moderate to severe centrilobular emphysema identified. Bibasilar fibrosis and biapical pleuroparenchymal scarring are again noted. No pleural effusion or pneumothorax. Upper Abdomen: No acute abnormality. Musculoskeletal: No acute or suspicious bony abnormalities. Review of the MIP images confirms the above findings. IMPRESSION: 1. No evidence of acute abnormality. No evidence of pulmonary emboli. 2. Unchanged 2.7 x 3 cm LEFT retro hilar lung mass compatible with malignancy is identified on recent PET CT. 3. Unchanged 1.7 cm RIGHT LOWER lobe basilar nodule. 4. Bibasilar pulmonary fibrosis. 5. Aortic Atherosclerosis (ICD10-I70.0) and Emphysema (ICD10-J43.9). Electronically Signed   By: Margarette Canada M.D.   On: 09/20/2018 16:31  Scheduled Meds: . aspirin EC  81 mg Oral q morning - 10a  . enoxaparin (LOVENOX) injection  40 mg Subcutaneous Q24H  . feeding supplement (ENSURE ENLIVE)  237 mL Oral BID BM  . folic acid  1 mg Oral Daily  . guaiFENesin  600 mg Oral BID  . ipratropium-albuterol  3 mL Nebulization QID  . methylPREDNISolone (SOLU-MEDROL) injection  40 mg Intravenous Q12H  . metoprolol tartrate  150 mg Oral q morning - 10a  . metoprolol tartrate  50 mg Oral QHS  . mirtazapine  15 mg Oral QHS  . mometasone-formoterol  2 puff Inhalation BID  . multivitamin with minerals  1 tablet Oral Daily  . pantoprazole  40 mg Oral q morning - 10a  . rosuvastatin  40 mg Oral QPC supper  . thiamine  100 mg Oral Daily   Or  . thiamine  100 mg Intravenous Daily  . umeclidinium bromide  1 puff Inhalation Daily   Continuous Infusions:   LOS: 2 days   Time spent: 25 minutes  Irwin Brakeman, MD [facility]  If 7PM-7AM, please contact  night-coverage [email or web address] Password [facility] 09/22/2018, 3:54 PM

## 2018-09-22 NOTE — Care Management (Addendum)
Patient Information   Patient Name Vincent Gilbert, Vincent Gilbert ([id number]) Sex Male DOB 07-22-1946  Room Bed  A332 A332-01  Patient Demographics   Address [address] [address] Phone [phone] (Home) *[phone] (Mobile)  Patient Ethnicity & Race   Ethnic Group Patient Race  Not Hispanic or Latino White or Caucasian  Emergency Contact(s)   Name Relation Home Work Mobile  [name],[name] Daughter [phone]  [name]   [phone]  Documents on File    Status Date Received Description  Documents for the Patient  EMR Patient Summary Not Received    [place] Not Received    [facility] E-Signature HIPAA Notice of Privacy Received 09/10/11   [place] E-Signature HIPAA Notice of Privacy Spanish Not Received    Driver's License Not Received    Insurance Card Not Received    Advance Directives/Living Will/HCPOA/POA Not Received    Editor, commissioning Not Received    Insurance Card Not Received    Insurance Card Received 04/11/14 MEDICARE & MUTUAL OF [employer] Card Received    Release of Information Not Received  AUTH ABD AORTA 05-23-62  VVS Policy for Pain - E Signature Not Received    AMB Intake Forms/Questionnaires Not Received    AMB Intake Forms/Questionnaires Not Received    AMB Patient Logs/Info  02/24/13   HIM ROI Authorization (Expired) 12/19/13 Department of [place] Authorization  12/19/13   Insurance Card     HIM ROI Authorization (Expired) 05/02/14 Department of [place] Authorization  05/11/14 Department of [place] Hospital Record  08/12/13 D/S [facility]  AMB Outside Hospital Record  08/10/13 H&P [place] [facility] Record  08/10/13 PROGRESS NOTE [facility]  AMB Correspondence  04/11/14 EMAIL  AMB Provider Completed Forms  05/02/14 DISABILITY  [place]  Other Photo ID Not Received    Concealed Handgun Permits  84/66/59   Insurance Card Received 07/10/15 VVS  HIM ROI Authorization  11/08/15 PT AUTH/DISABILITY QUESTIONNAIRE  Insurance Card     AMB Provider Completed Forms  12/04/15 DISABILITY BENEFITS QUESTIONNAIRE [facility]  HIM ROI Authorization  02/01/16 PATIENT REQUESTED STRESS TEST SIGNED FORM  Insurance Card   MCR/MOO/UHC 06/2016  AMB Outside Hospital Record  05/04/17 D/S REPORT [place] Card Received 07/20/17 new medicare  HIM ROI Authorization  04/12/18 [place]  HIM ROI Authorization  08/26/18 CURIS AT [place]  HIM ROI Authorization  08/26/18 CURIS AT [place]  HIM ROI Authorization  93/57/01 [place]  HIM ROI Authorization  09/06/18 [facility]IS AT [place]  Advance Directives/Living Will/HCPOA/POA Received 09/06/18   HIM ROI Authorization  09/16/18 [facility] AT [place]  HIM ROI Authorization Received 09/17/18 [facility] AT [place] [facility] Record (Deleted) 05/04/17 D/S REPORT [place]  HIM Release of Information Output (Deleted) 04/12/18 Requested records  HIM Release of Information Output (Deleted) 08/26/18 Requested records  HIM Release of Information Output  08/26/18 Requested records  HIM Release of Information Output (Deleted) 08/26/18 Requested records  HIM Release of Information Output (Deleted) 08/26/18 Requested records  HIM Release of Information Output (Deleted) 09/06/18 Requested records  HIM Release of Information Output  09/16/18 Requested records  HIM Release of Information Output  09/17/18 Requested records  Documents for the Encounter  AOB (Assignment of Insurance Benefits) Not Received    [place]  AOB Signed 09/20/18   MEDICARE RIGHTS Not Received    E-signature Medicare Rights Signed 09/20/18   ED Patient Billing Extract   ED PB Billing Extract  Cardiac Monitoring Strip Shift Summary  Received 09/21/18   EKG Received 09/21/18   Admission Information   Current Information   Attending Provider Admitting Provider Admission Type Admission Status  Murlean Iba, MD Lenore Cordia, MD Emergency Admission (Confirmed)       Admission Date/Time Discharge Date Hospital Service Auth/Cert Status  16/10/96 01:31 PM  Internal Medicine [place] [facility] Room/Bed   [facility] AP-DEPT 300 [phone]        Admission   Complaint  Weakness  Hospital Account   Name Acct ID Class Status Primary Coverage  Vincent Gilbert, Vincent Gilbert [id number] Inpatient Open MEDICARE - MEDICARE PART A AND B      Guarantor Account (for Hospital Account [id number])   Name Relation to Pt Service Area Active? Acct Type  Vincent Gilbert Self CHSA Yes Personal/Family  Address Phone    [address] [place], [place] [address] [phone])        Coverage Information (for Hospital Account [id number])   1. [place] PART A AND B   F/O Payor/Plan Precert #  MEDICARE/MEDICARE PART A AND B   Subscriber Subscriber #  Vincent Gilbert, Vincent Gilbert [id number]  Address Phone  [address] [place], [place] [address]   2. UNITED HEALTHCARE/UNITED HEALTHCARE OTHER   F/O Payor/Plan Precert #  [facility] OTHER   Subscriber Subscriber #  Vincent Gilbert, Vincent Gilbert [id number]  Address Phone  [address] [place] [address] [phone]   SS# [id number]

## 2018-09-22 NOTE — Progress Notes (Signed)
Subjective: He says he feels okay.  He is anticipating being discharged tomorrow.  Objective: Vital signs in last 24 hours: Temp:  [97.7 F (36.5 C)-98.3 F (36.8 C)] 98.2 F (36.8 C) (01/01 0627) Pulse Rate:  [72-101] 101 (01/01 1045) Resp:  [17-19] 18 (01/01 0627) BP: (117-150)/(52-91) 117/69 (01/01 1045) SpO2:  [96 %-100 %] 96 % (01/01 0739) Weight:  [55.5 kg] 55.5 kg (01/01 0627) Weight change: 1.068 kg Last BM Date: 09/19/18  Intake/Output from previous day: 12/31 0701 - 01/01 0700 In: 840 [P.O.:840] Out: 400 [Urine:400]  PHYSICAL EXAM General appearance: alert, cooperative, cachectic and mild distress Resp: Bilaterally diminished breath sounds Cardio: regular rate and rhythm, S1, S2 normal, no murmur, click, rub or gallop GI: soft, non-tender; bowel sounds normal; no masses,  no organomegaly Extremities: extremities normal, atraumatic, no cyanosis or edema  Lab Results:  Results for orders placed or performed during the hospital encounter of 09/20/18 (from the past 48 hour(s))  CBC with Differential/Platelet     Status: Abnormal   Collection Time: 09/20/18  2:18 PM  Result Value Ref Range   WBC 11.3 (H) 4.0 - 10.5 K/uL   RBC 3.43 (L) 4.22 - 5.81 MIL/uL   Hemoglobin 11.2 (L) 13.0 - 17.0 g/dL   HCT 33.7 (L) 39.0 - 52.0 %   MCV 98.3 80.0 - 100.0 fL   MCH 32.7 26.0 - 34.0 pg   MCHC 33.2 30.0 - 36.0 g/dL   RDW 12.2 11.5 - 15.5 %   Platelets 261 150 - 400 K/uL   nRBC 0.0 0.0 - 0.2 %   Neutrophils Relative % 75 %   Neutro Abs 8.5 (H) 1.7 - 7.7 K/uL   Lymphocytes Relative 12 %   Lymphs Abs 1.4 0.7 - 4.0 K/uL   Monocytes Relative 12 %   Monocytes Absolute 1.3 (H) 0.1 - 1.0 K/uL   Eosinophils Relative 0 %   Eosinophils Absolute 0.0 0.0 - 0.5 K/uL   Basophils Relative 0 %   Basophils Absolute 0.0 0.0 - 0.1 K/uL   Immature Granulocytes 1 %   Abs Immature Granulocytes 0.08 (H) 0.00 - 0.07 K/uL    Comment: Performed at [facility], [address], [place], [place]  [address]  Brain natriuretic peptide     Status: Abnormal   Collection Time: 09/20/18  2:18 PM  Result Value Ref Range   B Natriuretic Peptide 156.0 (H) 0.0 - 100.0 pg/mL    Comment: Performed at [facility], [address], [place], [place] [address]  Comprehensive metabolic panel     Status: Abnormal   Collection Time: 09/20/18  2:18 PM  Result Value Ref Range   Sodium 127 (L) 135 - 145 mmol/L   Potassium 4.6 3.5 - 5.1 mmol/L   Chloride 81 (L) 98 - 111 mmol/L   CO2 38 (H) 22 - 32 mmol/L   Glucose, Bld 120 (H) 70 - 99 mg/dL   BUN 7 (L) 8 - 23 mg/dL   Creatinine, Ser 0.74 0.61 - 1.24 mg/dL   Calcium 8.5 (L) 8.9 - 10.3 mg/dL   Total Protein 5.9 (L) 6.5 - 8.1 g/dL   Albumin 3.2 (L) 3.5 - 5.0 g/dL   AST 20 15 - 41 U/L   ALT 17 0 - 44 U/L   Alkaline Phosphatase 52 38 - 126 U/L   Total Bilirubin 0.7 0.3 - 1.2 mg/dL   GFR calc non Af Amer >60 >60 mL/min   GFR calc Af Amer >60 >60 mL/min  Anion gap 8 5 - 15    Comment: Performed at [facility], [address], [place], [place] [address]  Magnesium     Status: None   Collection Time: 09/20/18  2:18 PM  Result Value Ref Range   Magnesium 1.7 1.7 - 2.4 mg/dL    Comment: Performed at [facility], [address] [place], [place] [address]  Group A Strep by PCR     Status: None   Collection Time: 09/20/18  2:18 PM  Result Value Ref Range   Group A Strep by PCR NOT DETECTED NOT DETECTED    Comment: Performed at [facility], [address] [place], [place] [address]  Troponin I - ONCE - STAT     Status: None   Collection Time: 09/20/18  2:18 PM  Result Value Ref Range   Troponin I <0.03 <0.03 ng/mL    Comment: Performed at [facility], [address] [place], [place] [address]  Urinalysis, Routine w reflex microscopic     Status: Abnormal   Collection Time: 09/20/18  3:51 PM  Result Value Ref Range   Color, Urine STRAW (A) YELLOW   APPearance CLEAR CLEAR   Specific Gravity, Urine 1.004 (L) 1.005 - 1.030   pH 7.0 5.0 - 8.0   Glucose,  UA NEGATIVE NEGATIVE mg/dL   Hgb urine dipstick NEGATIVE NEGATIVE   Bilirubin Urine NEGATIVE NEGATIVE   Ketones, ur NEGATIVE NEGATIVE mg/dL   Protein, ur NEGATIVE NEGATIVE mg/dL   Nitrite NEGATIVE NEGATIVE   Leukocytes, UA NEGATIVE NEGATIVE    Comment: Performed at [facility], [address], [place], [place] [address]  Basic metabolic panel     Status: Abnormal   Collection Time: 09/21/18  5:41 AM  Result Value Ref Range   Sodium 129 (L) 135 - 145 mmol/L   Potassium 4.6 3.5 - 5.1 mmol/L   Chloride 86 (L) 98 - 111 mmol/L   CO2 36 (H) 22 - 32 mmol/L   Glucose, Bld 152 (H) 70 - 99 mg/dL   BUN 10 8 - 23 mg/dL   Creatinine, Ser 0.79 0.61 - 1.24 mg/dL   Calcium 8.1 (L) 8.9 - 10.3 mg/dL   GFR calc non Af Amer >60 >60 mL/min   GFR calc Af Amer >60 >60 mL/min   Anion gap 7 5 - 15    Comment: Performed at [facility], [address] [place], [place] [address]  CBC     Status: Abnormal   Collection Time: 09/21/18  5:41 AM  Result Value Ref Range   WBC 10.6 (H) 4.0 - 10.5 K/uL   RBC 3.23 (L) 4.22 - 5.81 MIL/uL   Hemoglobin 10.8 (L) 13.0 - 17.0 g/dL   HCT 32.5 (L) 39.0 - 52.0 %   MCV 100.6 (H) 80.0 - 100.0 fL   MCH 33.4 26.0 - 34.0 pg   MCHC 33.2 30.0 - 36.0 g/dL   RDW 12.2 11.5 - 15.5 %   Platelets 254 150 - 400 K/uL   nRBC 0.0 0.0 - 0.2 %    Comment: Performed at [facility], [address], [place], [place] [address]    ABGS No results for input(s): PHART, PO2ART, TCO2, HCO3 in the last 72 hours.  Invalid input(s): PCO2 CULTURES Recent Results (from the past 240 hour(s))  Group A Strep by PCR     Status: None   Collection Time: 09/20/18  2:18 PM  Result Value Ref Range Status   Group A Strep by PCR NOT DETECTED  NOT DETECTED Final    Comment: Performed at [facility], [address] [place], [place] [address]   Studies/Results: Dg Chest 2 View  Result Date: 09/20/2018 CLINICAL DATA:  Cough with generalized weakness EXAM: CHEST - 2 VIEW COMPARISON:  08/30/2018, CT  08/15/2018, PET-CT 06/02/2017, radiograph 04/28/2017 FINDINGS: Hyperinflation with emphysematous disease. Stable left upper lobe 17 mm ovoid nodule. No acute airspace disease or pleural effusion. Stable cardiomediastinal silhouette. No pneumothorax. Reticular opacity at the bases consistent with scarring and fibrosis. Left lower lobe retro hilar mass presumably contributing to increased hilar density. IMPRESSION: 1. Hyperinflation with emphysematous disease and basilar fibrosis. 2. Asymmetric opacity in the left hilar region presumably corresponds to known left retro hilar mass. 3. Stable left upper lobe ovoid pulmonary nodule. Electronically Signed   By: Donavan Foil M.D.   On: 09/20/2018 14:36   Ct Angio Chest Pe W And/or Wo Contrast  Result Date: 09/20/2018 CLINICAL DATA:  73 year old male with weakness, shortness of breath, hypoxia and pain. EXAM: CT ANGIOGRAPHY CHEST WITH CONTRAST TECHNIQUE: Multidetector CT imaging of the chest was performed using the standard protocol during bolus administration of intravenous contrast. Multiplanar CT image reconstructions and MIPs were obtained to evaluate the vascular anatomy. CONTRAST:  178mL ISOVUE-370 IOPAMIDOL (ISOVUE-370) INJECTION 76% COMPARISON:  08/15/2018 and prior CTs. 09/20/2018 and prior chest radiographs. 09/13/2018 and 06/02/2017 PET CT FINDINGS: Cardiovascular: This is a technically satisfactory study. No pulmonary emboli are identified. Heart size normal. Coronary artery and aortic atherosclerotic calcifications are present. No thoracic aortic aneurysm or pericardial effusion. Mediastinum/Nodes: Stable 9 mm subcarinal node identified. No changes identified. Lungs/Pleura: A 2.7 x 3 cm irregular mass along the MEDIAL aspect of the LEFT major fissure is again identified. A 1.6 x 1.7 cm irregular RIGHT LOWER lobe basilar nodule is unchanged. Other pulmonary opacities are unchanged and not hypermetabolic on recent PET-CT. Moderate to severe centrilobular  emphysema identified. Bibasilar fibrosis and biapical pleuroparenchymal scarring are again noted. No pleural effusion or pneumothorax. Upper Abdomen: No acute abnormality. Musculoskeletal: No acute or suspicious bony abnormalities. Review of the MIP images confirms the above findings. IMPRESSION: 1. No evidence of acute abnormality. No evidence of pulmonary emboli. 2. Unchanged 2.7 x 3 cm LEFT retro hilar lung mass compatible with malignancy is identified on recent PET CT. 3. Unchanged 1.7 cm RIGHT LOWER lobe basilar nodule. 4. Bibasilar pulmonary fibrosis. 5. Aortic Atherosclerosis (ICD10-I70.0) and Emphysema (ICD10-J43.9). Electronically Signed   By: Margarette Canada M.D.   On: 09/20/2018 16:31    Medications:  Prior to Admission:  Medications Prior to Admission  Medication Sig Dispense Refill Last Dose  . amLODipine (NORVASC) 10 MG tablet Take 10 mg by mouth daily.   09/20/2018 at Unknown time  . aspirin EC 81 MG tablet Take 81 mg by mouth every morning.    09/20/2018 at Unknown time  . budesonide-formoterol (SYMBICORT) 160-4.5 MCG/ACT inhaler Inhale 2 puffs into the lungs 2 (two) times daily.   09/19/2018 at Unknown time  . cetirizine (ZYRTEC) 10 MG tablet Take 10 mg by mouth daily.   09/20/2018 at Unknown time  . feeding supplement, ENSURE ENLIVE, (ENSURE ENLIVE) LIQD Take 237 mLs by mouth 2 (two) times daily between meals. 60 Bottle 0 Past Week at Unknown time  . fluticasone (FLONASE) 50 MCG/ACT nasal spray Place 2 sprays into both nostrils daily.  4 09/20/2018 at Unknown time  . furosemide (LASIX) 20 MG tablet Take 1 tablet (20 mg total) by mouth daily. 30 tablet 0 09/20/2018 at Unknown  time  . guaiFENesin (MUCINEX) 600 MG 12 hr tablet Take 600 mg by mouth 2 (two) times daily.    09/20/2018 at Unknown time  . ipratropium-albuterol (DUONEB) 0.5-2.5 (3) MG/3ML SOLN Take 3 mLs by nebulization every 6 (six) hours as needed (shortness of breath and wheeze). 90 mL 0 09/19/2018 at Unknown time  .  lisinopril (PRINIVIL,ZESTRIL) 40 MG tablet Take 1 tablet (40 mg total) by mouth daily. 90 tablet 3 09/20/2018 at Unknown time  . LORazepam (ATIVAN) 0.5 MG tablet Take 1 tablet (0.5 mg total) by mouth every 6 (six) hours as needed for anxiety. 30 tablet 0 09/20/2018 at Unknown time  . magnesium oxide (MAG-OX) 400 (241.3 Mg) MG tablet Take 1 tablet (400 mg total) by mouth 2 (two) times daily. 60 tablet 0 09/20/2018 at Unknown time  . metoprolol (LOPRESSOR) 100 MG tablet TAKE 1 TABLET (100 MG TOTAL) BY MOUTH 2 (TWO) TIMES DAILY. (Patient taking differently: Take 50-100 mg by mouth See admin instructions. Take 150mg  every morning and 50mg  every night) 60 tablet 3 09/20/2018 at 1030a  . mirtazapine (REMERON) 7.5 MG tablet Take 7.5 mg by mouth every morning.    09/20/2018 at Unknown time  . Multiple Vitamin (MULTIVITAMIN) tablet Take 1 tablet by mouth daily.   09/20/2018 at Unknown time  . pantoprazole (PROTONIX) 40 MG tablet Take 40 mg by mouth every morning.    09/20/2018 at Unknown time  . potassium chloride 20 MEQ TBCR Take 40 mEq by mouth daily. 60 tablet 0 09/20/2018 at Unknown time  . predniSONE (DELTASONE) 20 MG tablet Take 3 tablets (60 mg total) by mouth daily with breakfast for 3 days, THEN 2 tablets (40 mg total) daily with breakfast for 3 days, THEN 1 tablet (20 mg total) daily with breakfast for 3 days, THEN 0.5 tablets (10 mg total) daily with breakfast for 21 days. And decrease by one tablet every 2 days. 30 tablet 0 09/20/2018 at Unknown time  . rosuvastatin (CRESTOR) 40 MG tablet Take 40 mg by mouth daily after supper.   09/18/2018 at Unknown time  . Tiotropium Bromide Monohydrate (SPIRIVA RESPIMAT) 2.5 MCG/ACT AERS Inhale 2 puffs into the lungs every morning.   09/19/2018 at Unknown time  . oral electrolytes (THERMOTABS) TABS tablet Take 1 tablet by mouth daily.   Taking   Scheduled: . aspirin EC  81 mg Oral q morning - 10a  . enoxaparin (LOVENOX) injection  40 mg Subcutaneous Q24H  .  feeding supplement (ENSURE ENLIVE)  237 mL Oral BID BM  . folic acid  1 mg Oral Daily  . guaiFENesin  600 mg Oral BID  . ipratropium-albuterol  3 mL Nebulization QID  . methylPREDNISolone (SOLU-MEDROL) injection  40 mg Intravenous Q12H  . metoprolol tartrate  150 mg Oral q morning - 10a  . metoprolol tartrate  50 mg Oral QHS  . mirtazapine  15 mg Oral QHS  . mometasone-formoterol  2 puff Inhalation BID  . multivitamin with minerals  1 tablet Oral Daily  . pantoprazole  40 mg Oral q morning - 10a  . rosuvastatin  40 mg Oral QPC supper  . thiamine  100 mg Oral Daily   Or  . thiamine  100 mg Intravenous Daily  . umeclidinium bromide  1 puff Inhalation Daily   Continuous:  CBJ:[id number] **OR** acetaminophen, albuterol, LORazepam **OR** LORazepam, ondansetron **OR** ondansetron (ZOFRAN) IV  Assesment: He was admitted with acute on chronic hypoxic respiratory failure and multiple other problems.  He has a  lung mass that is almost certainly lung cancer and is not felt to be a candidate for treatment.  He is going to go home with hospice.  He anticipates discharge tomorrow Principal Problem:   Acute on chronic respiratory failure with hypoxia (HCC) Active Problems:   Hypertension   Alcohol abuse   Coronary artery disease   Protein-calorie malnutrition, severe   COPD exacerbation (HCC)   Lung mass   Chronic diastolic CHF (congestive heart failure) ([place])   Hyponatremia   Depression   Palliative care by specialist   Encounter for hospice care discussion    Plan: I will plan to follow more peripherally.  I think he has good plans in place.    LOS: 2 days   Alonza Bogus 09/22/2018, 11:00 AM

## 2018-09-22 NOTE — Care Management (Signed)
Call to [facility], rep for [facility]. He has met with patient yesterday here at hospital. Referred by Palliative NP.  They plan to admit patient into their services tomorrow when patient is discharged from hospital.

## 2018-09-22 NOTE — Evaluation (Signed)
Physical Therapy Evaluation Patient Details Name: Vincent Gilbert MRN: [id number] DOB: 01/25/1946 Today's Date: 09/22/2018   History of Present Illness  Vincent Gilbert is a 73 year old very pleasant white male who is seen in consultation today for further work-up and management of hypermetabolic bilateral lung lesions on PET scan.  He had a recent PET CT scan dated 09/13/2018 for follow-up of abnormal lung lesions.  This showed 2.2 x 2.7 cm left lung hilar lesion, hypermetabolic with SUV of 9.4.  There is a 1.7 x 1.3 cm irregular nodule in the posterior right lung base with SUV of 2.8.  No other hypermetabolic lung lesions were seen.  Patient is a current active smoker.  he also drinks about 8 beers per day.  Clinical Impression  PT I in mobility.  Only limitation is respiratory no balance or strength deficits noted.  No skilled PT needed at this time.     Follow Up Recommendations No PT follow up    Equipment Recommendations  None recommended by PT    Recommendations for Other Services   none    Precautions / Restrictions Precautions Precautions: None Restrictions Weight Bearing Restrictions: No      Mobility  Bed Mobility Overal bed mobility: Independent                Transfers Overall transfer level: Independent Equipment used: None                Ambulation/Gait Ambulation/Gait assistance: Independent Gait Distance (Feet): 80 Feet(2nd time 60  ft) Assistive device: None Gait Pattern/deviations: WFL(Within Functional Limits)   Gait velocity interpretation: <1.31 ft/sec, indicative of household ambulator General Gait Details: First ambulation O2 dropped to 85; explained the need for diaphragmaic breathing when walking 2nd walk O2 93   Stairs            Wheelchair Mobility    Modified Rankin (Stroke Patients Only)             Pertinent Vitals/Pain Pain Assessment: No/denies pain       Prior Function   Mod I uses O2 at home but no  assistive device to ambulate.               Hand Dominance        Extremity/Trunk Assessment        Lower Extremity Assessment Lower Extremity Assessment: Overall WFL for tasks assessed       Communication      Cognition Arousal/Alertness: Awake/alert Behavior During Therapy: WFL for tasks assessed/performed Overall Cognitive Status: Within Functional Limits for tasks assessed                                           Exercises General Exercises - Lower Extremity Long Arc Quad: 5 reps Hip Flexion/Marching: Seated;10 reps   Assessment/Plan    PT Assessment Patent does not need any further PT services  PT Problem List             PT Goals (Current goals can be found in the Care Plan section)  Acute Rehab PT Goals Patient Stated Goal: To go home  PT Goal Formulation: With patient Time For Goal Achievement: 09/24/18 Potential to Achieve Goals: Good               AM-PAC PT "6 Clicks" Mobility  Outcome Measure Help needed turning from your back to your  side while in a flat bed without using bedrails?: None Help needed moving from lying on your back to sitting on the side of a flat bed without using bedrails?: None Help needed moving to and from a bed to a chair (including a wheelchair)?: None Help needed standing up from a chair using your arms (e.g., wheelchair or bedside chair)?: None Help needed to walk in hospital room?: None Help needed climbing 3-5 steps with a railing? : A Little 6 Click Score: 23    End of Session Equipment Utilized During Treatment: Gait belt Activity Tolerance: Treatment limited secondary to medical complications (Comment)(O2) Patient left: in bed;with bed alarm set;with call bell/phone within reach Nurse Communication: Mobility status PT Visit Diagnosis: Other (comment)    Time: 0277-4128 PT Time Calculation (min) (ACUTE ONLY): 32 min   Charges:   PT Evaluation $PT Eval Low Complexity: [place], PT CLT [phone] 09/22/2018, 1:22 PM

## 2018-09-23 ENCOUNTER — Ambulatory Visit: Payer: Self-pay | Admitting: *Deleted

## 2018-09-23 DIAGNOSIS — F1010 Alcohol abuse, uncomplicated: Secondary | ICD-10-CM

## 2018-09-23 MED ORDER — METOPROLOL TARTRATE 100 MG PO TABS: 50.0000 mg | ORAL_TABLET | ORAL | Status: AC

## 2018-09-23 MED ORDER — PREDNISONE 20 MG PO TABS: ORAL_TABLET | ORAL | 0 refills | Status: AC

## 2018-09-23 MED ORDER — DRONABINOL 2.5 MG PO CAPS: 2.5000 mg | ORAL_CAPSULE | Freq: Two times a day (BID) | ORAL | 0 refills | Status: AC

## 2018-09-23 NOTE — Care Management (Signed)
Discharging home with hospice services provided by [facility]. [facility] aware patient has discharged and DC summary has been routed.

## 2018-09-23 NOTE — Discharge Instructions (Signed)
Hospice °Hospice is a service that is designed to provide people who are terminally ill and their families with medical, spiritual, and psychological support. Its aim is to improve your quality of life by keeping you as comfortable as possible in the final stages of life. °Who will be my providers when I begin hospice care? °Hospice teams often include: °· A nurse. °· A doctor. The hospice doctor will be available for your care, but you can include your regular doctor or nurse practitioner. °· A social worker. °· A counselor. °· A religious leader (such as a chaplain). °· A dietitian. °· Therapists. °· Trained volunteers who can help with care. °What services does hospice provide? °Hospice services can vary depending on the center or organization. Generally, they include: °· Ways to keep you comfortable, such as: °? Providing care in your home or in a home-like setting. °? Working with your family and friends to help meet your needs. °? Allowing you to enjoy the support of loved ones by receiving much of your basic care from family and friends. °· Pain relief and symptom management. The staff will supply all necessary medicines and equipment so that you can stay comfortable and alert enough to enjoy the company of your friends and family. °· Visits or care from a nurse and doctor. This may include 24-hour on-call services. °· Companionship when you are alone. °· Allowing you and your family to rest. Hospice staff may do light housekeeping, prepare meals, and run errands. °· Counseling. They will make sure your emotional, spiritual, and social needs are being met, as well as those needs of your family members. °· Spiritual care. This will be individualized to meet your needs and your family's needs. It may involve: °? Helping you and your family understand the dying process. °? Helping you say goodbye to your family and friends. °? Performing a specific religious ceremony or ritual. °· Massage. °· Nutrition  therapy. °· Physical and occupational therapy. °· Short-term inpatient care, if something cannot be managed in the home. °· Art or music therapy. °· Bereavement support for grieving family members. °When should hospice care begin? °Most people who use hospice are believed to have less than 6 months to live. °· Your family and health care providers can help you decide when hospice services should begin. °· If you live longer than 6 months but your condition does not improve, your doctor may be able to approve you for continued hospice care. °· If your condition improves, you may discontinue the program. °What should I consider before selecting a program? °Most hospice programs are run by nonprofit, independent organizations. Some are affiliated with hospitals, nursing homes, or home health care agencies. Hospice programs can take place in your home or at a hospice center, hospital, or skilled nursing facility. When choosing a hospice program, ask the following questions: °· What services are available to me? °· What services will be offered to my loved ones? °· How involved will my loved ones be? °· How involved will my health care provider be? °· Who makes up the hospice care team? How are they trained or screened? °· How will my pain and symptoms be managed? °· If my circumstances change, can the services be provided in a different setting, such as my home or in the hospital? °· Is the program reviewed and licensed by the state or certified in some other way? °· What does it cost? Is it covered by insurance? °· If I choose a hospice   center or nursing home, where is the hospice center located? Is it convenient for family and friends? °· If I choose a hospice center or nursing home, can my family and friends visit any time? °· Will you provide emotional and spiritual support? °· Who can my family call with questions? °Where can I learn more about hospice? °You can learn about existing hospice programs in your area  from your health care providers. You can also read more about hospice online. The websites of the following organizations have helpful information: °· National Hospice and Palliative Care Organization (NHPCO): www.nhpco.org °· National Association for Home Care & Hospice (NAHC): www.nahc.org °· [employer] (HFA): www.hospicefoundation.org °· American Cancer Society (ACS): www.cancer.org °· [facility] Net: www.hospicenet.org °· [facility] [employer] (VNAA): www.vnaa.org °You may also find more information by contacting the following agencies: °· A local agency on aging. °· Your local [employer] chapter. °· Your state's department of health or social services. °Summary °· Hospice is a service that is designed to provide people who are terminally ill and their families with medical, spiritual, and psychological support. °· Hospice aims to improve your quality of life by keeping you as comfortable as possible in the final stages of life. °· Hospice teams often include a doctor, nurse, social worker, counselor, religious leader,dietitian, therapists, and volunteers. °· Hospice care generally includes medicine for symptom management, visits from doctors and nurses, physical and occupational therapy, nutrition counseling, spiritual and emotional counseling, caregiver support, and bereavement support for grieving family members. °· Hospice programs can take place in your home or at a hospice center, hospital, or skilled nursing facility. °This information is not intended to replace advice given to you by your health care provider. Make sure you discuss any questions you have with your health care provider. °Document Released: 12/26/2003 Document Revised: 09/30/2016 Document Reviewed: 09/30/2016 °Elsevier Interactive Patient Education © 2019 [employer] ° ° ° °End-of-Life Care °End-of-life care is the physical, emotional, mental, and spiritual care a person receives during the last days,  weeks, or months of life. Care at the end of a person's life requires a team of professionals, which may include: °· Health care providers. °· A social worker. °· A spiritual adviser. °The goal of end-of-life care is to give the patient the highest quality of life possible at the end of life. °What are the different types of end-of-life care? °There are different options for receiving care at the end of your life. °Palliative care °This type of care can be delivered at the same time as other treatments. The goal is to manage your symptoms and improve your quality of life, which may include: °· Control of pain and other symptoms. °· Family support. °· Spiritual support. °· Emotional and social support. °· Comfort. °You may need palliative care for months or years to manage a long-term (chronic) disease or condition. °Hospice care °This is a kind of end-of-life care that may be recommended by your health care providers. Hospice care is usually offered when a person is expected to live for six months or less. °Hospice care is designed to provide people who are terminally ill and their families with medical, spiritual, and psychological support. The aim is to improve your quality of life by keeping you as comfortable as possible in the final stages of life. °Comfort care °This type of care is designed to help meet your basic needs and maintain your overall comfort at the end of your life. This includes: °·   Caring for your skin.  Making sure that you are breathing well.  Ensuring that you are eating well.  Making sure that you get enough rest.  Making sure that you are at a comfortable body temperature. A plan for comfort care can also address the mental, emotional, and spiritual issues that may come up at the end of your life. Where does end-of-life care take place? End-of-life care can take place wherever you are living, as long as you get the care you need. End-of-life care can happen:  At your  home.  In a nursing home.  In a hospital. You and your loved ones may be able to decide where end-of-life care takes place. This decision depends on:  Your wishes.  Your comfort.  The medical equipment you need. How do I know when it is time for end-of-life care? Your health care provider may tell you that treatments can no longer control your illness. You may also decide that you do not want to undergo the treatments that are available. Talk to your health care provider and your loved ones about your end-of-life care options. If possible, discuss the following topics with your health care provider before you need end-of-life care:  How much medical treatment you want during end-of-life care.  Where you would like to live during end-of-life care.  What kinds of treatments you would like to keep you comfortable.  Which treatments you would refuse.  Your faith or spiritual needs at the end of your life.  Who will handle practical details, such as your will, finances, and funeral planning. You can create legal documents (advance directives) to let your loved ones know your wishes for end-of-life care. Talk to your health care provider or a lawyer about making a living will that explains your medical wishes. You can also have a medical power of attorney. This designates a person to make health decisions for you if you cannot make them yourself. Summary  End-of-life care is the physical, emotional, mental, and spiritual care a person receives during the last days, weeks, or months of life.  The goal of end-of-life care is to give the patient the highest quality of life possible at the end of life.  There are a number of different options for receiving this care, including palliative care, hospice care, or comfort care.  Talk to your health care provider and your loved ones about your preferences for end-of-life care. This includes the place to receive care, the kind of care you want to  receive, the care you want to decline, your spiritual needs, and your finances. This information is not intended to replace advice given to you by your health care provider. Make sure you discuss any questions you have with your health care provider. Document Released: 04/05/2014 Document Revised: 09/22/2017 Document Reviewed: 09/22/2017 Elsevier Interactive Patient Education  2019 [employer].

## 2018-09-23 NOTE — Discharge Summary (Addendum)
Physician Discharge Summary  SHIELDS PAUTZ PJA:[id number] DOB: 20-Jul-1946 DOA: 09/20/2018  PCP: Glenda Chroman, MD  Admit date: 09/20/2018 Discharge date: 09/23/2018  Admitted From: Home  Disposition: Home with hospice services 1. Recommendations : Symptom management per hospice protocol   Discharge Condition: HOSPICE   CODE STATUS: DNR    Brief Hospitalization Summary: Please see all hospital notes, images, labs for full details of the hospitalization. Vincent Gilbert a 73 y.o.malewith medical history significant forchronic respiratory failure with hypoxia on 2-3 L supplemental O2 via [place] at home, COPD, lung mass suspected to be a primary bronchogenic neoplasm by PET scan 09/14/2018, hypertension, chronic diastolic CHF, history of CVA, CAD, and alcohol dependence who presents the ED with increased dyspnea on exertion and poor oral intake over 1 week. His daughter and POA is at bedside.Patient reports worsened dyspnea exertion compared to baseline with shortness of breath developing after just walking a few steps. He has cough with occasional scant white sputum production. He says he has a depressed mood and attributes this to not eating at all (except for occasional candy)for the last week. He says he drinks 8 to 9 twelveounce beers per day. He is normally on 2 L supplemental O2 at home but has to had to increase to 3 L. Denies any fevers, chest pain, abdominal pain, dysuria. He has had some diarrhea.He and his daughter are not aware of the recent PET scan results and see her to discuss this in person with Dr. Luan Pulling with upcoming appointment, however have asked to be notified with the results during my examination.  Acute on chronic respiratory failure with hypoxia/COPD: Progressive and worsening condition due to end-stage COPD and high likelihood lung cancer based on imaging. He is requiring increased He continues to improve with treatments.   Home with home hospice  services.   Left lower lobe lung mass, suspected bronchogenic neoplasm: Had a long conversation with patient and daughter regarding the PET/CT results and comparisons from prior CT scans. Patient has previously been noted as a poor candidate for surgical and/or chemoradiation options which likely continues to be the case. Their preference is towards maintaining quality of life.  Pt to discharge home with hospice services.   EtOH abuse: Patient drinking 8-9 beers a day with very minimal other oral intake. Pt strongly encouraged to stop all alcohol consumption.   Hyponatremia: Sodium 127 on admission. Secondary to low solute diet,alcohol use,and dehydration. Slowly improving.  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   Severe protein calorie malnutrition: Patient with known malnutrition exacerbated with depression and low desire to maintain oral intake.  Depression: He reports depressed mood, but denies suicidal or homicidal ideation. He reports associated sleep difficulty. -Increase home mirtazapine to 15 mg nightly, may help with depression, appetite, and sleep  Hypertension: He is actually hypotensive on admission due to poor oral intake. -metoprolol has been resumed.  Atrial Fibrillation with RVR - resumed home metoprolol.  Follow.   CAD: He denies any chest pain. -Continue aspirin 81 mg daily  Chronic diastolic CHF: Stable.   DVT prophylaxis: Lovenox Code Status: DNR  family Communication: Daughter  disposition Plan: Home tomorrow  Discharge Diagnoses:  Principal Problem:   Acute on chronic respiratory failure with hypoxia ([place]) Active Problems:   Hypertension   Alcohol abuse   Coronary artery disease   Protein-calorie malnutrition, severe   COPD exacerbation (HCC)   Lung mass   Chronic diastolic CHF (congestive heart failure) (HCC)   Hyponatremia   Depression  Palliative care by specialist   Encounter for hospice care discussion    Discharge  Instructions:  Allergies as of 09/23/2018      Reactions   Oxycodone    nausea   Codeine Palpitations      Medication List    STOP taking these medications   lisinopril 40 MG tablet Commonly known as:  PRINIVIL,ZESTRIL     TAKE these medications   amLODipine 10 MG tablet Commonly known as:  NORVASC Take 10 mg by mouth daily.   aspirin EC 81 MG tablet Take 81 mg by mouth every morning.   budesonide-formoterol 160-4.5 MCG/ACT inhaler Commonly known as:  SYMBICORT Inhale 2 puffs into the lungs 2 (two) times daily.   cetirizine 10 MG tablet Commonly known as:  ZYRTEC Take 10 mg by mouth daily.   dronabinol 2.5 MG capsule Commonly known as:  MARINOL Take 1 capsule (2.5 mg total) by mouth 2 (two) times daily before a meal.   feeding supplement (ENSURE ENLIVE) Liqd Take 237 mLs by mouth 2 (two) times daily between meals.   fluticasone 50 MCG/ACT nasal spray Commonly known as:  FLONASE Place 2 sprays into both nostrils daily.   furosemide 20 MG tablet Commonly known as:  LASIX Take 1 tablet (20 mg total) by mouth daily.   guaiFENesin 600 MG 12 hr tablet Commonly known as:  MUCINEX Take 600 mg by mouth 2 (two) times daily.   ipratropium-albuterol 0.5-2.5 (3) MG/3ML Soln Commonly known as:  DUONEB Take 3 mLs by nebulization every 6 (six) hours as needed (shortness of breath and wheeze).   LORazepam 0.5 MG tablet Commonly known as:  ATIVAN Take 1 tablet (0.5 mg total) by mouth every 6 (six) hours as needed for anxiety.   magnesium oxide 400 (241.3 Mg) MG tablet Commonly known as:  MAG-OX Take 1 tablet (400 mg total) by mouth 2 (two) times daily.   metoprolol tartrate 100 MG tablet Commonly known as:  LOPRESSOR Take 0.5-1 tablets (50-100 mg total) by mouth See admin instructions. Take 150mg  every morning and 50mg  every night   mirtazapine 7.5 MG tablet Commonly known as:  REMERON Take 7.5 mg by mouth every morning.   multivitamin tablet Take 1 tablet by mouth  daily.   oral electrolytes Tabs tablet Take 1 tablet by mouth daily.   pantoprazole 40 MG tablet Commonly known as:  PROTONIX Take 40 mg by mouth every morning.   Potassium Chloride ER 20 MEQ Tbcr Take 40 mEq by mouth daily.   predniSONE 20 MG tablet Commonly known as:  DELTASONE Take 3 PO QAM x3days, 2 PO QAM x3days, 1 PO QAM x3days Start taking on:  September 24, 2018 What changed:  See the new instructions.   rosuvastatin 40 MG tablet Commonly known as:  CRESTOR Take 40 mg by mouth daily after supper.   SPIRIVA RESPIMAT 2.5 MCG/ACT Aers Generic drug:  Tiotropium Bromide Monohydrate Inhale 2 puffs into the lungs every morning.       Allergies  Allergen Reactions  . Oxycodone     nausea  . Codeine Palpitations   Allergies as of 09/23/2018      Reactions   Oxycodone    nausea   Codeine Palpitations      Medication List    STOP taking these medications   lisinopril 40 MG tablet Commonly known as:  PRINIVIL,ZESTRIL     TAKE these medications   amLODipine 10 MG tablet Commonly known as:  NORVASC Take 10 mg by  mouth daily.   aspirin EC 81 MG tablet Take 81 mg by mouth every morning.   budesonide-formoterol 160-4.5 MCG/ACT inhaler Commonly known as:  SYMBICORT Inhale 2 puffs into the lungs 2 (two) times daily.   cetirizine 10 MG tablet Commonly known as:  ZYRTEC Take 10 mg by mouth daily.   dronabinol 2.5 MG capsule Commonly known as:  MARINOL Take 1 capsule (2.5 mg total) by mouth 2 (two) times daily before a meal.   feeding supplement (ENSURE ENLIVE) Liqd Take 237 mLs by mouth 2 (two) times daily between meals.   fluticasone 50 MCG/ACT nasal spray Commonly known as:  FLONASE Place 2 sprays into both nostrils daily.   furosemide 20 MG tablet Commonly known as:  LASIX Take 1 tablet (20 mg total) by mouth daily.   guaiFENesin 600 MG 12 hr tablet Commonly known as:  MUCINEX Take 600 mg by mouth 2 (two) times daily.   ipratropium-albuterol  0.5-2.5 (3) MG/3ML Soln Commonly known as:  DUONEB Take 3 mLs by nebulization every 6 (six) hours as needed (shortness of breath and wheeze).   LORazepam 0.5 MG tablet Commonly known as:  ATIVAN Take 1 tablet (0.5 mg total) by mouth every 6 (six) hours as needed for anxiety.   magnesium oxide 400 (241.3 Mg) MG tablet Commonly known as:  MAG-OX Take 1 tablet (400 mg total) by mouth 2 (two) times daily.   metoprolol tartrate 100 MG tablet Commonly known as:  LOPRESSOR Take 0.5-1 tablets (50-100 mg total) by mouth See admin instructions. Take 150mg  every morning and 50mg  every night   mirtazapine 7.5 MG tablet Commonly known as:  REMERON Take 7.5 mg by mouth every morning.   multivitamin tablet Take 1 tablet by mouth daily.   oral electrolytes Tabs tablet Take 1 tablet by mouth daily.   pantoprazole 40 MG tablet Commonly known as:  PROTONIX Take 40 mg by mouth every morning.   Potassium Chloride ER 20 MEQ Tbcr Take 40 mEq by mouth daily.   predniSONE 20 MG tablet Commonly known as:  DELTASONE Take 3 PO QAM x3days, 2 PO QAM x3days, 1 PO QAM x3days Start taking on:  September 24, 2018 What changed:  See the new instructions.   rosuvastatin 40 MG tablet Commonly known as:  CRESTOR Take 40 mg by mouth daily after supper.   SPIRIVA RESPIMAT 2.5 MCG/ACT Aers Generic drug:  Tiotropium Bromide Monohydrate Inhale 2 puffs into the lungs every morning.       Procedures/Studies: Dg Chest 2 View  Result Date: 09/20/2018 CLINICAL DATA:  Cough with generalized weakness EXAM: CHEST - 2 VIEW COMPARISON:  08/30/2018, CT 08/15/2018, PET-CT 06/02/2017, radiograph 04/28/2017 FINDINGS: Hyperinflation with emphysematous disease. Stable left upper lobe 17 mm ovoid nodule. No acute airspace disease or pleural effusion. Stable cardiomediastinal silhouette. No pneumothorax. Reticular opacity at the bases consistent with scarring and fibrosis. Left lower lobe retro hilar mass presumably  contributing to increased hilar density. IMPRESSION: 1. Hyperinflation with emphysematous disease and basilar fibrosis. 2. Asymmetric opacity in the left hilar region presumably corresponds to known left retro hilar mass. 3. Stable left upper lobe ovoid pulmonary nodule. Electronically Signed   By: Donavan Foil M.D.   On: 09/20/2018 14:36   Dg Chest 2 View  Result Date: 08/30/2018 CLINICAL DATA:  Shortness of breath, noisy respiration. History of COPD, peripheral vascular disease, previous MI, former smoker. EXAM: CHEST - 2 VIEW COMPARISON:  Chest x-ray of August 10, 2018 and chest CT scan of August 15, 2018. FINDINGS: The lungs remain mildly hyperinflated. The interstitial markings remain increased. The heart and pulmonary vascularity are normal. Subtle nodular density inferior to the anterior tip of the left second rib is again demonstrated. A previously described mass in the left lower lobe is not clearly visible. The bony thorax exhibits no acute abnormality. IMPRESSION: COPD. Chronic interstitial prominence may reflect fibrosis. No overt CHF or alveolar pneumonia. A known left lower lobe demonstrated on the August 15, 2018 chest CT scan mass is not clearly evident on this study. Electronically Signed   By: David  Martinique M.D.   On: 08/30/2018 08:31   Ct Angio Chest Pe W And/or Wo Contrast  Result Date: 09/20/2018 CLINICAL DATA:  73 year old male with weakness, shortness of breath, hypoxia and pain. EXAM: CT ANGIOGRAPHY CHEST WITH CONTRAST TECHNIQUE: Multidetector CT imaging of the chest was performed using the standard protocol during bolus administration of intravenous contrast. Multiplanar CT image reconstructions and MIPs were obtained to evaluate the vascular anatomy. CONTRAST:  157mL ISOVUE-370 IOPAMIDOL (ISOVUE-370) INJECTION 76% COMPARISON:  08/15/2018 and prior CTs. 09/20/2018 and prior chest radiographs. 09/13/2018 and 06/02/2017 PET CT FINDINGS: Cardiovascular: This is a technically  satisfactory study. No pulmonary emboli are identified. Heart size normal. Coronary artery and aortic atherosclerotic calcifications are present. No thoracic aortic aneurysm or pericardial effusion. Mediastinum/Nodes: Stable 9 mm subcarinal node identified. No changes identified. Lungs/Pleura: A 2.7 x 3 cm irregular mass along the MEDIAL aspect of the LEFT major fissure is again identified. A 1.6 x 1.7 cm irregular RIGHT LOWER lobe basilar nodule is unchanged. Other pulmonary opacities are unchanged and not hypermetabolic on recent PET-CT. Moderate to severe centrilobular emphysema identified. Bibasilar fibrosis and biapical pleuroparenchymal scarring are again noted. No pleural effusion or pneumothorax. Upper Abdomen: No acute abnormality. Musculoskeletal: No acute or suspicious bony abnormalities. Review of the MIP images confirms the above findings. IMPRESSION: 1. No evidence of acute abnormality. No evidence of pulmonary emboli. 2. Unchanged 2.7 x 3 cm LEFT retro hilar lung mass compatible with malignancy is identified on recent PET CT. 3. Unchanged 1.7 cm RIGHT LOWER lobe basilar nodule. 4. Bibasilar pulmonary fibrosis. 5. Aortic Atherosclerosis (ICD10-I70.0) and Emphysema (ICD10-J43.9). Electronically Signed   By: Margarette Canada M.D.   On: 09/20/2018 16:31   Nm Pet Image Restag (ps) Skull Base To Thigh  Result Date: 09/14/2018 CLINICAL DATA:  Initial treatment strategy for newly progressive lung mass. EXAM: NUCLEAR MEDICINE PET SKULL BASE TO THIGH TECHNIQUE: 9.5 mCi F-18 FDG was injected intravenously. Full-ring PET imaging was performed from the skull base to thigh after the radiotracer. CT data was obtained and used for attenuation correction and anatomic localization. Fasting blood glucose: 141 mg/dl COMPARISON:  Chest CT 08/15/2018. PET-CT 06/02/2017 FINDINGS: Mediastinal blood pool activity: SUV max 2.3 NECK: No hypermetabolic lymph nodes in the neck. Incidental CT findings: none CHEST: The retro hilar  left lung mass measures 2.2 x 2.7 cm today and is markedly hypermetabolic with SUV max = 9.4. Reviewing sagittal images on the prior CT, this is identified in the left lower lobe contiguous with the major fissure. The hypermetabolism extends into the cranial aspect of the left hilum. No hypermetabolic mediastinal or contralateral lymph nodes. 1.7 x 1.3 cm irregular nodule in the posterior right lung base (153/3) shows low level hypermetabolism with SUV max = 2.8. 1.4 cm anterior left upper lobe nodule (77/3) shows minimal FDG accumulation with SUV max = 1.1. This is below blood pool levels. No other hypermetabolic pulmonary parenchymal or  pleural lesion evident. Incidental CT findings: Emphysema noted. There is abdominal aortic atherosclerosis without aneurysm. Coronary artery calcification is evident. ABDOMEN/PELVIS: No abnormal hypermetabolic activity within the liver, pancreas, adrenal glands, or spleen. No hypermetabolic lymph nodes in the abdomen or pelvis. Incidental CT findings: Right kidney atrophic. 9 mm exophytic lesion in the upper pole region of the right kidney measures water attenuation in shows no hypermetabolism, likely a cyst. Stomach is decompressed with non hypermetabolic fold thickening. Appearance is stable since 06/02/2017. SKELETON: No focal hypermetabolic activity to suggest skeletal metastasis. Incidental CT findings: none IMPRESSION: 1. Progressing left lower lobe retro hilar lesion of concern on CT of 08/15/2018 is markedly hypermetabolic, consistent with malignancy. Primary bronchogenic neoplasm be the primary concern. The hypermetabolism tracks from the nodule into the superior aspect of the left hilum. No evidence for mediastinal or right hilar hypermetabolic lymphadenopathy. 2. 1.7 x 1.3 cm irregular nodule in the posterior right lung base shows low level hypermetabolism with SUV max = 2.8. This lesion also shows progressive features. Synchronous or metastatic disease a distinct  consideration. 3. Smoothly marginated 1.4 cm left upper lobe pulmonary nodule shows no hypermetabolism. 4. No other hypermetabolic disease in the neck, chest, abdomen, or pelvis. 5.  Aortic Atherosclerois (ICD10-170.0) 6.  Emphysema. (OIZ12-W58.9) Electronically Signed   By: Misty Stanley M.D.   On: 09/14/2018 08:50     Subjective: Pt sitting up eating, says he is feeling a lot better.  He is breathing better today.    Discharge Exam: Vitals:   09/23/18 1200 09/23/18 1318  BP: (!) 147/61   Pulse: 87   Resp:    Temp: 98.4 F (36.9 C)   SpO2: 98% 97%   Vitals:   09/23/18 0810 09/23/18 0900 09/23/18 1200 09/23/18 1318  BP:  135/63 (!) 147/61   Pulse:  (!) 105 87   Resp:      Temp:   98.4 F (36.9 C)   TempSrc:   Oral   SpO2: 100% 96% 98% 97%  Weight:      Height:       General exam: Appears calm and comfortable  Respiratory system: bibasilar exp wheezing heard.  Respiratory effort normal. Cardiovascular system: S1 & S2 heard, RRR. No JVD, murmurs, rubs, gallops or clicks. No pedal edema. Gastrointestinal system: Abdomen is nondistended, soft and nontender. No organomegaly or masses felt. Normal bowel sounds heard. Central nervous system: Alert and oriented. No focal neurological deficits. Extremities: Symmetric 5 x 5 power. Skin: No rashes, lesions or ulcers Psychiatry: Judgement and insight appear normal. Mood & affect appropriate.     The results of significant diagnostics from this hospitalization (including imaging, microbiology, ancillary and laboratory) are listed below for reference.     Microbiology: Recent Results (from the past 240 hour(s))  Group A Strep by PCR     Status: None   Collection Time: 09/20/18  2:18 PM  Result Value Ref Range Status   Group A Strep by PCR NOT DETECTED NOT DETECTED Final    Comment: Performed at [facility], [address], [place], [place] [address]     Labs: BNP (last 3 results) Recent Labs    08/30/18 0748 09/20/18 1418   BNP 163.0* 382.5*   Basic Metabolic Panel: Recent Labs  Lab 09/20/18 1418 09/21/18 0541  NA 127* 129*  K 4.6 4.6  CL 81* 86*  CO2 38* 36*  GLUCOSE 120* 152*  BUN 7* 10  CREATININE 0.74 0.79  CALCIUM 8.5* 8.1*  MG 1.7  --  Liver Function Tests: Recent Labs  Lab 09/20/18 1418  AST 20  ALT 17  ALKPHOS 52  BILITOT 0.7  PROT 5.9*  ALBUMIN 3.2*   No results for input(s): LIPASE, AMYLASE in the last 168 hours. No results for input(s): AMMONIA in the last 168 hours. CBC: Recent Labs  Lab 09/20/18 1418 09/21/18 0541  WBC 11.3* 10.6*  NEUTROABS 8.5*  --   HGB 11.2* 10.8*  HCT 33.7* 32.5*  MCV 98.3 100.6*  PLT 261 254   Cardiac Enzymes: Recent Labs  Lab 09/20/18 1418  TROPONINI <0.03   BNP: Invalid input(s): POCBNP CBG: No results for input(s): GLUCAP in the last 168 hours. D-Dimer No results for input(s): DDIMER in the last 72 hours. Hgb A1c No results for input(s): HGBA1C in the last 72 hours. Lipid Profile No results for input(s): CHOL, HDL, LDLCALC, TRIG, CHOLHDL, LDLDIRECT in the last 72 hours. Thyroid function studies No results for input(s): TSH, T4TOTAL, T3FREE, THYROIDAB in the last 72 hours.  Invalid input(s): FREET3 Anemia work up No results for input(s): VITAMINB12, FOLATE, FERRITIN, TIBC, IRON, RETICCTPCT in the last 72 hours. Urinalysis    Component Value Date/Time   COLORURINE STRAW (A) 09/20/2018 1551   APPEARANCEUR CLEAR 09/20/2018 1551   LABSPEC 1.004 (L) 09/20/2018 1551   PHURINE 7.0 09/20/2018 1551   GLUCOSEU NEGATIVE 09/20/2018 1551   HGBUR NEGATIVE 09/20/2018 [place] 09/20/2018 1551   KETONESUR NEGATIVE 09/20/2018 1551   PROTEINUR NEGATIVE 09/20/2018 1551   UROBILINOGEN 1.0 03/10/2012 1312   NITRITE NEGATIVE 09/20/2018 1551   LEUKOCYTESUR NEGATIVE 09/20/2018 1551   Sepsis Labs Invalid input(s): PROCALCITONIN,  WBC,  LACTICIDVEN Microbiology Recent Results (from the past 240 hour(s))  Group A Strep  by PCR     Status: None   Collection Time: 09/20/18  2:18 PM  Result Value Ref Range Status   Group A Strep by PCR NOT DETECTED NOT DETECTED Final    Comment: Performed at [facility], [address] [place], [place] [address]   Time coordinating discharge:   SIGNED:  Irwin Brakeman, MD  [facility] 09/23/2018, 1:31 PM Pager [phone]  If 7PM-7AM, please contact night-coverage [email or web address] Password [email or web address]

## 2018-09-23 NOTE — Progress Notes (Signed)
Palliative: Mr. Vincent Gilbert is resting quietly in bed.  He is sleeping soundly and does not open his eyes when I call his name.  There is no family at bedside at this time. Mr. Vincent Gilbert and his daughter Vincent Gilbert have plan for home with hospice.  No further needs noted at this time. Conference with nursing staff related to patient needs. No charge. Vincent Axe, Vincent Gilbert Palliative Medicine Team Team Phone # [phone]  Greater than 50% of this time was spent counseling and coordinating care related to the above assessment and plan.

## 2018-09-23 NOTE — Progress Notes (Signed)
He hopes to be discharged home today with home hospice care.  I think that is entirely appropriate.  I will plan to sign off.

## 2018-09-24 ENCOUNTER — Other Ambulatory Visit: Payer: Self-pay | Admitting: *Deleted

## 2018-09-24 NOTE — Patient Outreach (Signed)
Pt hospitalized 09/20/18-09/23/18 for COPD exacerbation, has new diagnosis lung cancer, RN CM placed outreach call and spoke with pt, HIPAA verified, pt reports [facility] came out to see him last night and he is "officially on hospice services"  , pt states he has everything he needs at home and support of his daughter.  No further needs for care management.  RN CM mailed case closure letter to pt home and faxed case closure letter to primary MD Dr. Woody Seller.  Case closed  Jacqlyn Larsen [facility], [place] Coordinator [phone]

## 2018-10-12 ENCOUNTER — Other Ambulatory Visit (HOSPITAL_COMMUNITY): Payer: Self-pay | Admitting: Family Medicine

## 2018-10-21 ENCOUNTER — Telehealth: Payer: Self-pay | Admitting: Cardiology

## 2018-10-23 NOTE — Telephone Encounter (Signed)
Numerous attempts to contact patient with recall letters. Unable to reach by telephone. with no success.   Merlene Laughter, RN [[phone]] 03/22/2018 8:58 AM New [10]    [System] 03/22/2018 11:04 PM Notification Sent [20]   Merlene Laughter, RN [[id number]] 04/07/2018 5:13 PM Notification Sent [20]   Vincent Gilbert [[id number]] 10-23-2018 9:26 AM Notification Sent [20]

## 2018-10-23 DEATH — deceased

## 2019-06-12 IMAGING — CT CT ANGIO CHEST
2 of 6 series · 18 of 46 positions shown · IV contrast (Isovue)
Comparison: 08/15/2018 and prior CTs. 09/20/2018 and prior chest
radiographs. 09/13/2018 and 06/02/2017 PET CT

CLINICAL DATA: 72-year-old male with weakness, shortness of breath,
hypoxia and pain.

EXAM:
CT ANGIOGRAPHY CHEST WITH CONTRAST
TECHNIQUE: Multidetector CT imaging of the chest was performed using the
standard protocol during bolus administration of intravenous
contrast. Multiplanar CT image reconstructions and MIPs were
obtained to evaluate the vascular anatomy.
CONTRAST:  100mL 0PN0V9-MC3 IOPAMIDOL (0PN0V9-MC3) INJECTION 76%

[Series 7: thins · axial · 0.63mm/px · z∈[+1122,+1458]mm · 15 of 369 slices shown]
[im 17/369  lung]
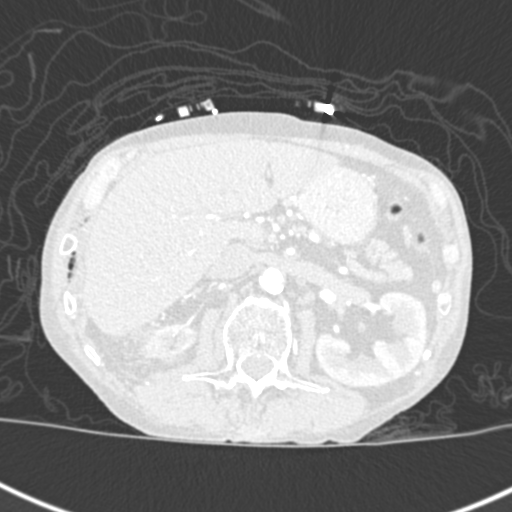
[im 49/369  soft-tissue]
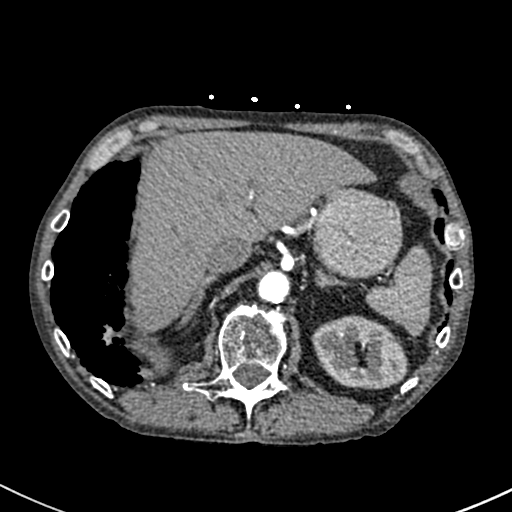
[im 65/369  lung]
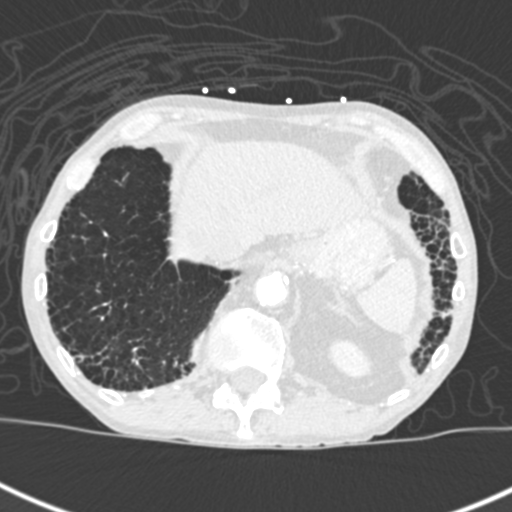
[im 97/369  soft-tissue]
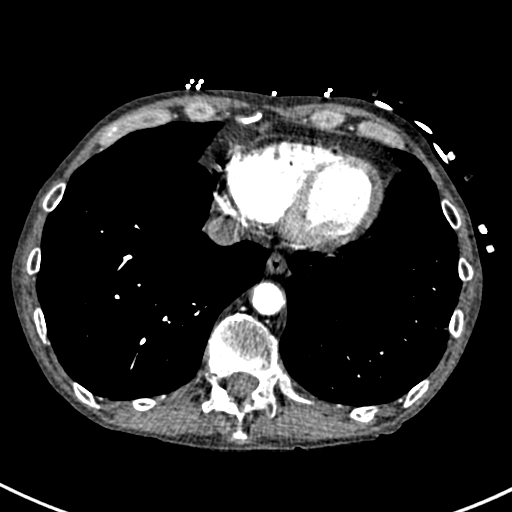
[im 113/369  lung]
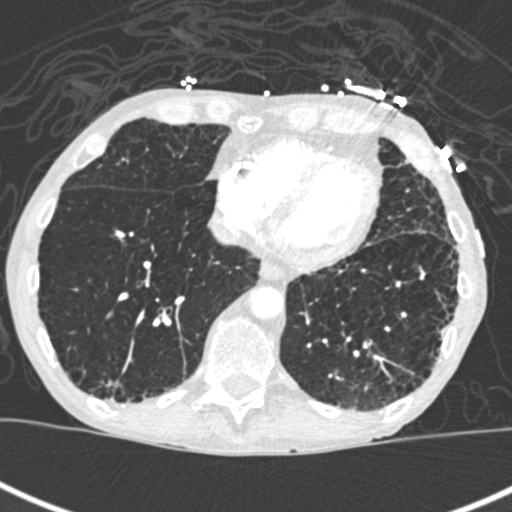
[im 145/369  soft-tissue]
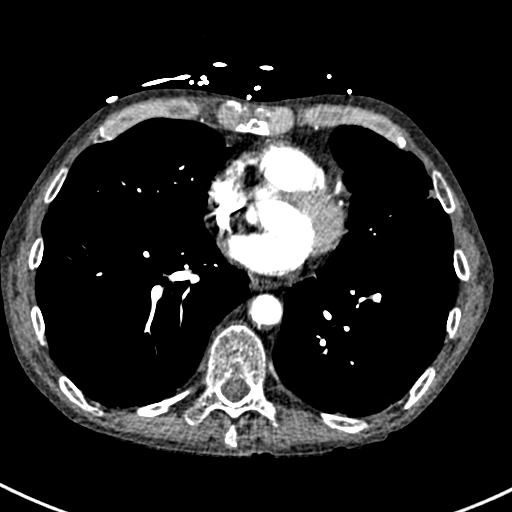
[im 161/369  lung]
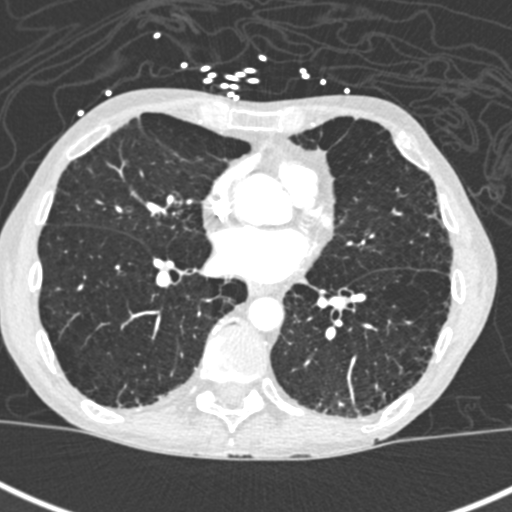
[im 193/369  soft-tissue]
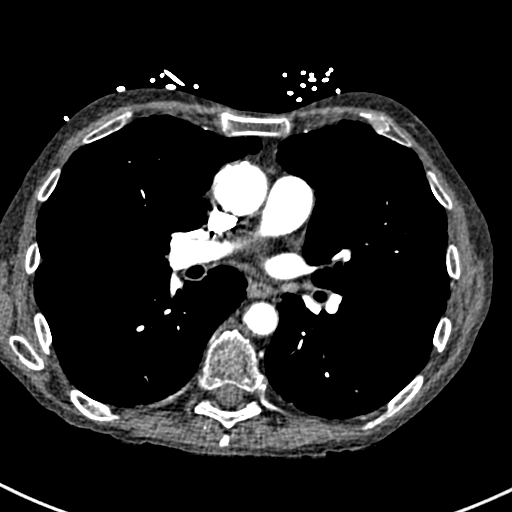
[im 209/369  lung]
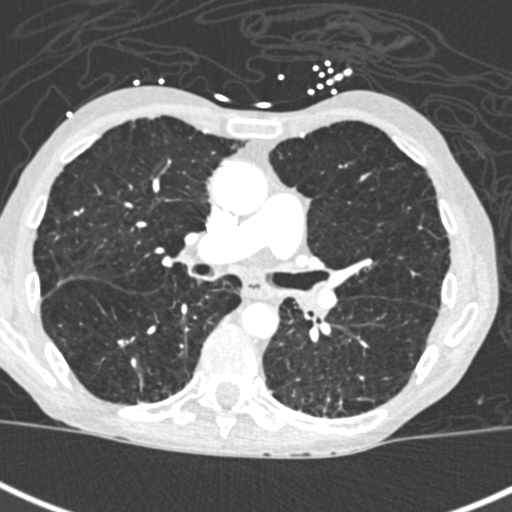
[im 225/369  soft-tissue]
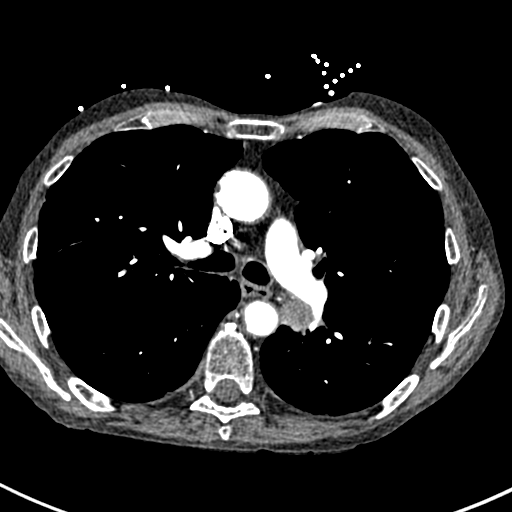
[im 257/369  lung]
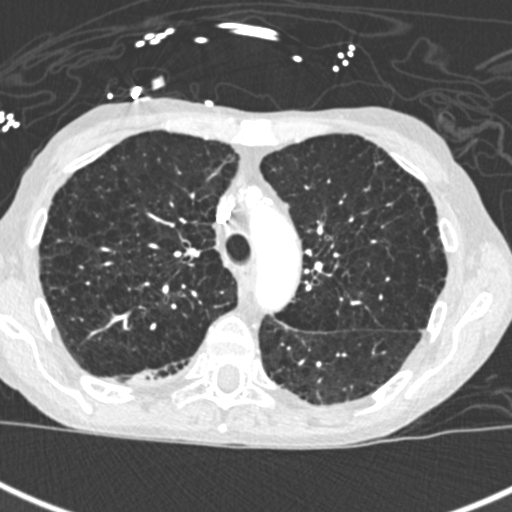
[im 273/369  soft-tissue]
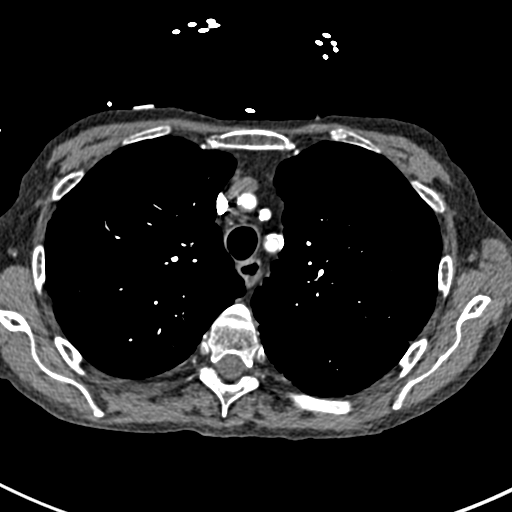
[im 305/369  lung]
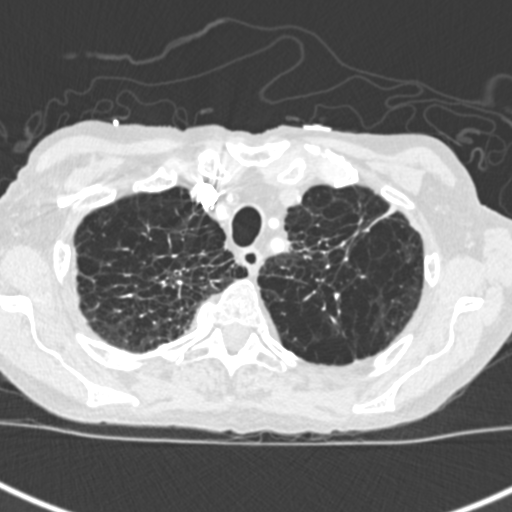
[im 321/369  soft-tissue]
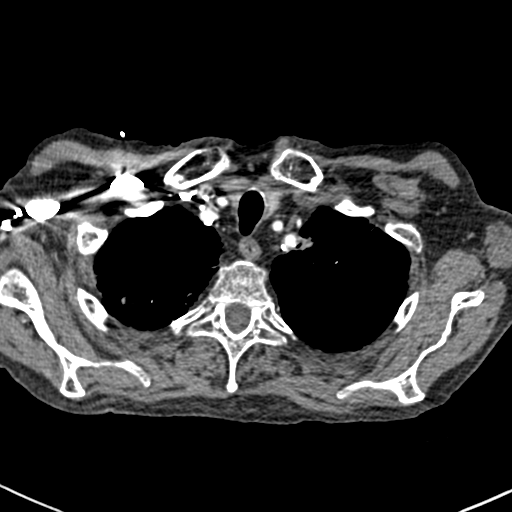
[im 353/369  lung]
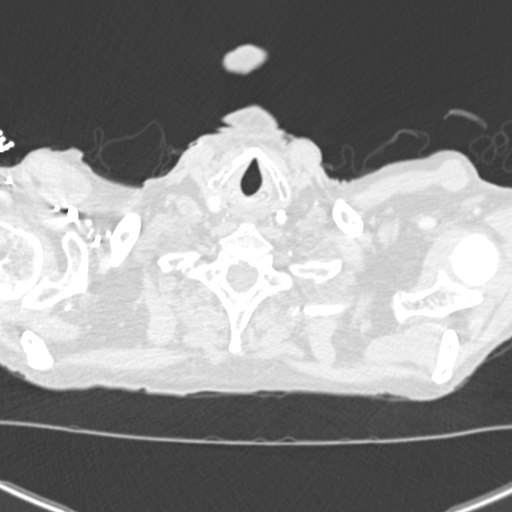

[Series 9: coronal mpr · coronal · 0.64mm/px · 3 of 150 slices shown]
[im 38/150  soft-tissue]
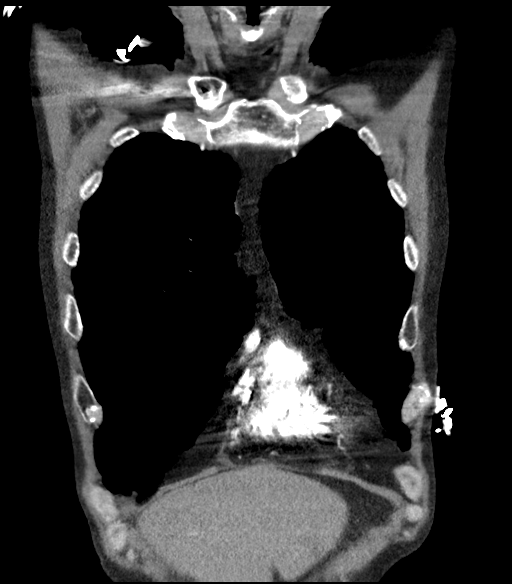
[im 75/150  soft-tissue]
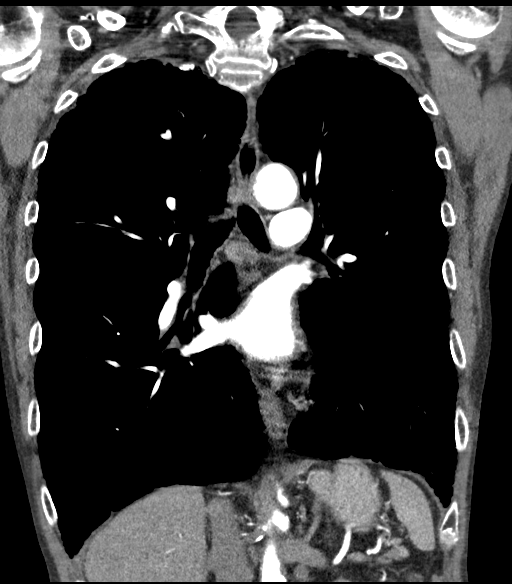
[im 112/150  soft-tissue]
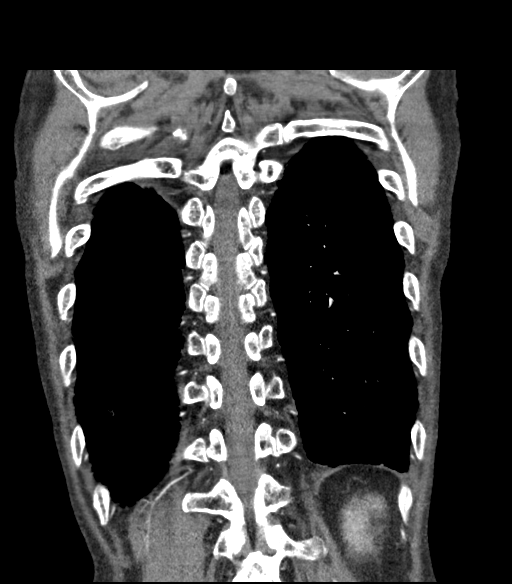

[18 of 46 positions shown; findings below may reference images not displayed]

FINDINGS: Cardiovascular: This is a technically satisfactory study. No
pulmonary emboli are identified. Heart size normal. Coronary artery
and aortic atherosclerotic calcifications are present. No thoracic
aortic aneurysm or pericardial effusion.

Mediastinum/Nodes: Stable 9 mm subcarinal node identified. No
changes identified.

Lungs/Pleura: A 2.7 x 3 cm irregular mass along the MEDIAL aspect of
the LEFT major fissure is again identified.

A 1.6 x 1.7 cm irregular RIGHT LOWER lobe basilar nodule is
unchanged.

Other pulmonary opacities are unchanged and not hypermetabolic on
recent PET-CT.

Moderate to severe centrilobular emphysema identified.

Bibasilar fibrosis and biapical pleuroparenchymal scarring are again
noted.

No pleural effusion or pneumothorax.

Upper Abdomen: No acute abnormality.

Musculoskeletal: No acute or suspicious bony abnormalities.

Review of the MIP images confirms the above findings.
IMPRESSION: 1. No evidence of acute abnormality. No evidence of pulmonary
emboli.
2. Unchanged 2.7 x 3 cm LEFT retro hilar lung mass compatible with
malignancy is identified on recent PET CT.
3. Unchanged 1.7 cm RIGHT LOWER lobe basilar nodule.
4. Bibasilar pulmonary fibrosis.
5. Aortic Atherosclerosis ([id number]-[id number].K) and Emphysema ([id number].H).
